# Patient Record
Sex: Male | Born: 1940
Health system: Southern US, Community
[De-identification: ages and names within clinical notes are randomized; demographics above are authoritative.]

## PROBLEM LIST (undated history)

## (undated) DIAGNOSIS — I499 Cardiac arrhythmia, unspecified: Secondary | ICD-10-CM

## (undated) DIAGNOSIS — R7303 Prediabetes: Secondary | ICD-10-CM

## (undated) DIAGNOSIS — S46109A Unspecified injury of muscle, fascia and tendon of long head of biceps, unspecified arm, initial encounter: Secondary | ICD-10-CM

## (undated) DIAGNOSIS — E785 Hyperlipidemia, unspecified: Secondary | ICD-10-CM

## (undated) DIAGNOSIS — H905 Unspecified sensorineural hearing loss: Secondary | ICD-10-CM

## (undated) DIAGNOSIS — M1990 Unspecified osteoarthritis, unspecified site: Secondary | ICD-10-CM

## (undated) DIAGNOSIS — K279 Peptic ulcer, site unspecified, unspecified as acute or chronic, without hemorrhage or perforation: Secondary | ICD-10-CM

## (undated) DIAGNOSIS — I10 Essential (primary) hypertension: Secondary | ICD-10-CM

## (undated) DIAGNOSIS — Z8669 Personal history of other diseases of the nervous system and sense organs: Secondary | ICD-10-CM

## (undated) DIAGNOSIS — I4892 Unspecified atrial flutter: Secondary | ICD-10-CM

## (undated) DIAGNOSIS — M7550 Bursitis of unspecified shoulder: Secondary | ICD-10-CM

## (undated) DIAGNOSIS — K219 Gastro-esophageal reflux disease without esophagitis: Secondary | ICD-10-CM

## (undated) DIAGNOSIS — C801 Malignant (primary) neoplasm, unspecified: Secondary | ICD-10-CM

## (undated) DIAGNOSIS — R9431 Abnormal electrocardiogram [ECG] [EKG]: Secondary | ICD-10-CM

## (undated) DIAGNOSIS — C4490 Unspecified malignant neoplasm of skin, unspecified: Secondary | ICD-10-CM

## (undated) HISTORY — DX: Personal history of other diseases of the nervous system and sense organs: Z86.69

## (undated) HISTORY — DX: Bursitis of unspecified shoulder: M75.50

## (undated) HISTORY — DX: Unspecified atrial flutter: I48.92

## (undated) HISTORY — DX: Prediabetes: R73.03

## (undated) HISTORY — DX: Unspecified sensorineural hearing loss: H90.5

## (undated) HISTORY — DX: Malignant (primary) neoplasm, unspecified: C80.1

## (undated) HISTORY — DX: Unspecified osteoarthritis, unspecified site: M19.90

## (undated) HISTORY — DX: Unspecified injury of muscle, fascia and tendon of long head of biceps, unspecified arm, initial encounter: S46.109A

## (undated) HISTORY — PX: MELANOMA EXCISION: SHX5266

## (undated) HISTORY — DX: Hyperlipidemia, unspecified: E78.5

## (undated) HISTORY — DX: Gastro-esophageal reflux disease without esophagitis: K21.9

## (undated) HISTORY — DX: Abnormal electrocardiogram (ECG) (EKG): R94.31

## (undated) HISTORY — DX: Peptic ulcer, site unspecified, unspecified as acute or chronic, without hemorrhage or perforation: K27.9

## (undated) HISTORY — PX: INTRAOCULAR LENS INSERTION: SHX110

## (undated) HISTORY — PX: WISDOM TOOTH EXTRACTION: SHX21

## (undated) HISTORY — PX: VASECTOMY: SHX75

---

## 1962-10-24 DIAGNOSIS — K279 Peptic ulcer, site unspecified, unspecified as acute or chronic, without hemorrhage or perforation: Secondary | ICD-10-CM

## 1962-10-24 HISTORY — DX: Peptic ulcer, site unspecified, unspecified as acute or chronic, without hemorrhage or perforation: K27.9

## 1997-10-24 DIAGNOSIS — R9431 Abnormal electrocardiogram [ECG] [EKG]: Secondary | ICD-10-CM

## 1997-10-24 HISTORY — DX: Abnormal electrocardiogram (ECG) (EKG): R94.31

## 1998-10-24 HISTORY — PX: REPLACEMENT TOTAL KNEE: SUR1224

## 1998-11-04 ENCOUNTER — Encounter: Payer: Self-pay | Admitting: Gastroenterology

## 1998-11-04 ENCOUNTER — Other Ambulatory Visit: Admission: RE | Admit: 1998-11-04 | Discharge: 1998-11-04 | Payer: Self-pay | Admitting: Gastroenterology

## 1999-08-25 ENCOUNTER — Encounter: Payer: Self-pay | Admitting: Orthopedic Surgery

## 1999-09-03 ENCOUNTER — Encounter: Payer: Self-pay | Admitting: Orthopedic Surgery

## 1999-09-03 ENCOUNTER — Inpatient Hospital Stay (HOSPITAL_COMMUNITY): Admission: RE | Admit: 1999-09-03 | Discharge: 1999-09-07 | Payer: Self-pay | Admitting: Orthopedic Surgery

## 2001-10-24 DIAGNOSIS — Z8669 Personal history of other diseases of the nervous system and sense organs: Secondary | ICD-10-CM

## 2001-10-24 HISTORY — DX: Personal history of other diseases of the nervous system and sense organs: Z86.69

## 2002-06-03 ENCOUNTER — Inpatient Hospital Stay (HOSPITAL_COMMUNITY): Admission: EM | Admit: 2002-06-03 | Discharge: 2002-06-14 | Payer: Self-pay | Admitting: Emergency Medicine

## 2002-06-03 ENCOUNTER — Encounter: Payer: Self-pay | Admitting: Emergency Medicine

## 2002-06-03 ENCOUNTER — Encounter: Payer: Self-pay | Admitting: Pediatrics

## 2002-06-04 ENCOUNTER — Encounter: Payer: Self-pay | Admitting: Pulmonary Disease

## 2002-06-05 ENCOUNTER — Encounter: Payer: Self-pay | Admitting: Pulmonary Disease

## 2002-06-06 ENCOUNTER — Encounter: Payer: Self-pay | Admitting: Pulmonary Disease

## 2002-06-07 ENCOUNTER — Encounter: Payer: Self-pay | Admitting: Critical Care Medicine

## 2002-06-08 ENCOUNTER — Encounter: Payer: Self-pay | Admitting: Critical Care Medicine

## 2002-06-10 ENCOUNTER — Encounter: Payer: Self-pay | Admitting: Critical Care Medicine

## 2002-06-11 ENCOUNTER — Encounter: Payer: Self-pay | Admitting: Critical Care Medicine

## 2002-06-12 ENCOUNTER — Encounter: Payer: Self-pay | Admitting: Critical Care Medicine

## 2002-06-14 ENCOUNTER — Inpatient Hospital Stay (HOSPITAL_COMMUNITY)
Admission: RE | Admit: 2002-06-14 | Discharge: 2002-06-18 | Payer: Self-pay | Admitting: Physical Medicine & Rehabilitation

## 2003-01-27 ENCOUNTER — Encounter: Payer: Self-pay | Admitting: Gastroenterology

## 2003-03-03 ENCOUNTER — Encounter: Payer: Self-pay | Admitting: Gastroenterology

## 2004-02-17 ENCOUNTER — Encounter: Payer: Self-pay | Admitting: Internal Medicine

## 2005-02-02 ENCOUNTER — Ambulatory Visit: Payer: Self-pay | Admitting: Gastroenterology

## 2005-02-14 ENCOUNTER — Ambulatory Visit: Payer: Self-pay | Admitting: Internal Medicine

## 2006-02-20 ENCOUNTER — Ambulatory Visit: Payer: Self-pay | Admitting: Internal Medicine

## 2007-03-01 DIAGNOSIS — G610 Guillain-Barre syndrome: Secondary | ICD-10-CM

## 2007-03-01 DIAGNOSIS — Z9889 Other specified postprocedural states: Secondary | ICD-10-CM

## 2007-03-01 DIAGNOSIS — M1990 Unspecified osteoarthritis, unspecified site: Secondary | ICD-10-CM

## 2007-03-05 ENCOUNTER — Ambulatory Visit: Payer: Self-pay | Admitting: Internal Medicine

## 2007-03-05 LAB — CONVERTED CEMR LAB
ALT: 26 units/L (ref 0–40)
AST: 26 units/L (ref 0–37)
Albumin: 4.1 g/dL (ref 3.5–5.2)
Alkaline Phosphatase: 73 units/L (ref 39–117)
BUN: 14 mg/dL (ref 6–23)
Basophils Absolute: 0 10*3/uL (ref 0.0–0.1)
Basophils Relative: 0.3 % (ref 0.0–1.0)
Bilirubin, Direct: 0.1 mg/dL (ref 0.0–0.3)
CO2: 30 meq/L (ref 19–32)
Calcium: 9.2 mg/dL (ref 8.4–10.5)
Chloride: 110 meq/L (ref 96–112)
Cholesterol: 156 mg/dL (ref 0–200)
Creatinine, Ser: 0.9 mg/dL (ref 0.4–1.5)
Eosinophils Absolute: 0.1 10*3/uL (ref 0.0–0.6)
Eosinophils Relative: 2.1 % (ref 0.0–5.0)
GFR calc Af Amer: 110 mL/min
GFR calc non Af Amer: 91 mL/min
Glucose, Bld: 116 mg/dL — ABNORMAL HIGH (ref 70–99)
HCT: 43.8 % (ref 39.0–52.0)
HDL: 25.2 mg/dL — ABNORMAL LOW (ref >39.0)
Hemoglobin: 15 g/dL (ref 13.0–17.0)
Hgb A1c MFr Bld: 6 % (ref 4.6–6.0)
LDL Cholesterol: 116 mg/dL — ABNORMAL HIGH (ref 0–99)
Lymphocytes Relative: 27 % (ref 12.0–46.0)
MCHC: 34.3 g/dL (ref 30.0–36.0)
MCV: 87.1 fL (ref 78.0–100.0)
Monocytes Absolute: 0.6 10*3/uL (ref 0.2–0.7)
Monocytes Relative: 8.6 % (ref 3.0–11.0)
Neutro Abs: 4.1 10*3/uL (ref 1.4–7.7)
Neutrophils Relative %: 62 % (ref 43.0–77.0)
PSA: 1.27 ng/mL (ref 0.10–4.00)
Platelets: 243 10*3/uL (ref 150–400)
Potassium: 4.8 meq/L (ref 3.5–5.1)
RBC: 5.03 M/uL (ref 4.22–5.81)
RDW: 13 % (ref 11.5–14.6)
Sodium: 143 meq/L (ref 135–145)
TSH: 1.86 microintl units/mL (ref 0.35–5.50)
Total Bilirubin: 0.6 mg/dL (ref 0.3–1.2)
Total CHOL/HDL Ratio: 6.2
Total Protein: 7 g/dL (ref 6.0–8.3)
Triglycerides: 74 mg/dL (ref 0–149)
VLDL: 15 mg/dL (ref 0–40)
WBC: 6.6 10*3/uL (ref 4.5–10.5)

## 2007-07-25 ENCOUNTER — Telehealth (INDEPENDENT_AMBULATORY_CARE_PROVIDER_SITE_OTHER): Payer: Self-pay | Admitting: *Deleted

## 2007-07-27 ENCOUNTER — Ambulatory Visit: Payer: Self-pay | Admitting: Internal Medicine

## 2007-08-03 ENCOUNTER — Encounter (INDEPENDENT_AMBULATORY_CARE_PROVIDER_SITE_OTHER): Payer: Self-pay | Admitting: *Deleted

## 2007-08-03 LAB — CONVERTED CEMR LAB
ALT: 24 units/L (ref 0–53)
AST: 24 units/L (ref 0–37)
Cholesterol: 119 mg/dL (ref 0–200)
HDL: 26.8 mg/dL — ABNORMAL LOW (ref >39.0)
LDL Cholesterol: 79 mg/dL (ref 0–99)
Total CHOL/HDL Ratio: 4.4
Triglycerides: 67 mg/dL (ref 0–149)
VLDL: 13 mg/dL (ref 0–40)

## 2008-01-30 ENCOUNTER — Telehealth (INDEPENDENT_AMBULATORY_CARE_PROVIDER_SITE_OTHER): Payer: Self-pay | Admitting: *Deleted

## 2008-01-30 ENCOUNTER — Ambulatory Visit: Payer: Self-pay | Admitting: Internal Medicine

## 2008-02-13 ENCOUNTER — Encounter (INDEPENDENT_AMBULATORY_CARE_PROVIDER_SITE_OTHER): Payer: Self-pay | Admitting: *Deleted

## 2008-02-13 LAB — CONVERTED CEMR LAB
ALT: 27 units/L (ref 0–53)
AST: 25 units/L (ref 0–37)
Cholesterol: 126 mg/dL (ref 0–200)
HDL: 24.6 mg/dL — ABNORMAL LOW (ref >39.0)
LDL Cholesterol: 85 mg/dL (ref 0–99)
Total CHOL/HDL Ratio: 5.1
Triglycerides: 81 mg/dL (ref 0–149)
VLDL: 16 mg/dL (ref 0–40)

## 2008-07-02 ENCOUNTER — Ambulatory Visit: Payer: Self-pay | Admitting: Internal Medicine

## 2008-07-02 DIAGNOSIS — R9431 Abnormal electrocardiogram [ECG] [EKG]: Secondary | ICD-10-CM

## 2008-07-02 DIAGNOSIS — K219 Gastro-esophageal reflux disease without esophagitis: Secondary | ICD-10-CM | POA: Insufficient documentation

## 2008-07-02 DIAGNOSIS — R351 Nocturia: Secondary | ICD-10-CM | POA: Insufficient documentation

## 2008-07-02 DIAGNOSIS — E785 Hyperlipidemia, unspecified: Secondary | ICD-10-CM

## 2008-07-02 DIAGNOSIS — R7303 Prediabetes: Secondary | ICD-10-CM

## 2008-07-02 DIAGNOSIS — E119 Type 2 diabetes mellitus without complications: Secondary | ICD-10-CM | POA: Insufficient documentation

## 2008-07-02 DIAGNOSIS — E1169 Type 2 diabetes mellitus with other specified complication: Secondary | ICD-10-CM | POA: Insufficient documentation

## 2008-07-02 HISTORY — DX: Prediabetes: R73.03

## 2008-07-07 ENCOUNTER — Telehealth (INDEPENDENT_AMBULATORY_CARE_PROVIDER_SITE_OTHER): Payer: Self-pay | Admitting: *Deleted

## 2008-07-08 ENCOUNTER — Encounter (INDEPENDENT_AMBULATORY_CARE_PROVIDER_SITE_OTHER): Payer: Self-pay | Admitting: *Deleted

## 2008-07-08 ENCOUNTER — Ambulatory Visit: Payer: Self-pay | Admitting: Internal Medicine

## 2009-01-07 ENCOUNTER — Encounter (INDEPENDENT_AMBULATORY_CARE_PROVIDER_SITE_OTHER): Payer: Self-pay | Admitting: *Deleted

## 2009-07-08 ENCOUNTER — Ambulatory Visit: Payer: Self-pay | Admitting: Internal Medicine

## 2009-07-08 DIAGNOSIS — Z85828 Personal history of other malignant neoplasm of skin: Secondary | ICD-10-CM

## 2009-07-08 DIAGNOSIS — N400 Enlarged prostate without lower urinary tract symptoms: Secondary | ICD-10-CM

## 2009-07-14 ENCOUNTER — Encounter (INDEPENDENT_AMBULATORY_CARE_PROVIDER_SITE_OTHER): Payer: Self-pay | Admitting: *Deleted

## 2009-10-09 ENCOUNTER — Ambulatory Visit: Payer: Self-pay | Admitting: Internal Medicine

## 2009-10-09 DIAGNOSIS — R1031 Right lower quadrant pain: Secondary | ICD-10-CM

## 2009-10-09 LAB — CONVERTED CEMR LAB
Bilirubin Urine: NEGATIVE
Glucose, Urine, Semiquant: NEGATIVE
Ketones, urine, test strip: NEGATIVE
Urobilinogen, UA: 0.2
pH: 6

## 2009-10-12 LAB — CONVERTED CEMR LAB
Basophils Absolute: 0 10*3/uL (ref 0.0–0.1)
Eosinophils Relative: 2 % (ref 0–5)
HCT: 42 % (ref 39.0–52.0)
Hemoglobin: 13.9 g/dL (ref 13.0–17.0)
Lymphocytes Relative: 28 % (ref 12–46)
Lymphs Abs: 2.2 10*3/uL (ref 0.7–4.0)
Monocytes Absolute: 0.7 10*3/uL (ref 0.1–1.0)
Monocytes Relative: 9 % (ref 3–12)
RBC: 4.7 M/uL (ref 4.22–5.81)
RDW: 13.3 % (ref 11.5–15.5)

## 2009-10-24 HISTORY — PX: OTHER SURGICAL HISTORY: SHX169

## 2010-01-25 ENCOUNTER — Encounter: Payer: Self-pay | Admitting: Internal Medicine

## 2010-01-26 ENCOUNTER — Encounter (INDEPENDENT_AMBULATORY_CARE_PROVIDER_SITE_OTHER): Payer: Self-pay | Admitting: *Deleted

## 2010-02-15 ENCOUNTER — Telehealth: Payer: Self-pay | Admitting: Gastroenterology

## 2010-02-19 ENCOUNTER — Encounter (INDEPENDENT_AMBULATORY_CARE_PROVIDER_SITE_OTHER): Payer: Self-pay | Admitting: *Deleted

## 2010-03-15 ENCOUNTER — Ambulatory Visit: Payer: Self-pay | Admitting: Gastroenterology

## 2010-03-15 DIAGNOSIS — R195 Other fecal abnormalities: Secondary | ICD-10-CM

## 2010-03-16 LAB — CONVERTED CEMR LAB
ALT: 25 units/L (ref 0–53)
AST: 27 units/L (ref 0–37)
Albumin: 4.1 g/dL (ref 3.5–5.2)
Alkaline Phosphatase: 71 units/L (ref 39–117)
BUN: 15 mg/dL (ref 6–23)
Basophils Absolute: 0 10*3/uL (ref 0.0–0.1)
CO2: 26 meq/L (ref 19–32)
Chloride: 104 meq/L (ref 96–112)
Eosinophils Relative: 1.5 % (ref 0.0–5.0)
Glucose, Bld: 122 mg/dL — ABNORMAL HIGH (ref 70–99)
Lipase: 28 units/L (ref 11.0–59.0)
Lymphs Abs: 1.6 10*3/uL (ref 0.7–4.0)
Monocytes Relative: 8.4 % (ref 3.0–12.0)
Neutrophils Relative %: 63.3 % (ref 43.0–77.0)
Platelets: 216 10*3/uL (ref 150.0–400.0)
Potassium: 4.3 meq/L (ref 3.5–5.1)
RDW: 14.2 % (ref 11.5–14.6)
Sodium: 136 meq/L (ref 135–145)
Total Protein: 6.8 g/dL (ref 6.0–8.3)
WBC: 6 10*3/uL (ref 4.5–10.5)

## 2010-03-18 ENCOUNTER — Telehealth: Payer: Self-pay | Admitting: Gastroenterology

## 2010-03-18 ENCOUNTER — Telehealth (INDEPENDENT_AMBULATORY_CARE_PROVIDER_SITE_OTHER): Payer: Self-pay | Admitting: *Deleted

## 2010-03-19 ENCOUNTER — Telehealth: Payer: Self-pay | Admitting: Gastroenterology

## 2010-03-26 ENCOUNTER — Ambulatory Visit: Payer: Self-pay | Admitting: Gastroenterology

## 2010-03-31 ENCOUNTER — Encounter: Payer: Self-pay | Admitting: Gastroenterology

## 2010-06-18 ENCOUNTER — Telehealth: Payer: Self-pay | Admitting: Gastroenterology

## 2010-06-18 ENCOUNTER — Ambulatory Visit: Payer: Self-pay | Admitting: Gastroenterology

## 2010-06-18 LAB — CONVERTED CEMR LAB: BUN: 20 mg/dL (ref 6–23)

## 2010-06-30 ENCOUNTER — Ambulatory Visit: Payer: Self-pay | Admitting: Cardiovascular Disease

## 2010-10-24 HISTORY — PX: ABLATION OF DYSRHYTHMIC FOCUS: SHX254

## 2010-11-14 ENCOUNTER — Encounter: Payer: Self-pay | Admitting: Gastroenterology

## 2010-11-21 LAB — CONVERTED CEMR LAB
ALT: 37 units/L (ref 0–53)
AST: 24 units/L (ref 0–37)
AST: 26 units/L (ref 0–37)
Albumin: 4.1 g/dL (ref 3.5–5.2)
Alkaline Phosphatase: 80 units/L (ref 39–117)
BUN: 13 mg/dL (ref 6–23)
BUN: 15 mg/dL (ref 6–23)
Bilirubin, Direct: 0 mg/dL (ref 0.0–0.3)
Calcium: 9.1 mg/dL (ref 8.4–10.5)
Cholesterol, target level: 200 mg/dL
Cholesterol: 137 mg/dL (ref 0–200)
Creatinine, Ser: 0.7 mg/dL (ref 0.4–1.5)
Eosinophils Relative: 1.3 % (ref 0.0–5.0)
Eosinophils Relative: 2.1 % (ref 0.0–5.0)
GFR calc Af Amer: 125 mL/min
GFR calc non Af Amer: 119.86 mL/min (ref >60)
Glucose, Bld: 116 mg/dL — ABNORMAL HIGH (ref 70–99)
HCT: 45.5 % (ref 39.0–52.0)
HDL goal, serum: 40 mg/dL
HDL: 24.1 mg/dL — ABNORMAL LOW (ref >39.0)
Hemoglobin: 15.7 g/dL (ref 13.0–17.0)
Hgb A1c MFr Bld: 6 % (ref 4.6–6.5)
LDL Cholesterol: 90 mg/dL (ref 0–99)
LDL Goal: 100 mg/dL
Monocytes Absolute: 0.5 10*3/uL (ref 0.1–1.0)
Monocytes Relative: 7.4 % (ref 3.0–12.0)
Monocytes Relative: 8.2 % (ref 3.0–12.0)
Neutrophils Relative %: 64.6 % (ref 43.0–77.0)
Platelets: 218 10*3/uL (ref 150.0–400.0)
Platelets: 234 10*3/uL (ref 150–400)
Potassium: 5.3 meq/L — ABNORMAL HIGH (ref 3.5–5.1)
RBC: 5.03 M/uL (ref 4.22–5.81)
TSH: 1.38 microintl units/mL (ref 0.35–5.50)
Total Bilirubin: 1 mg/dL (ref 0.3–1.2)
Total CHOL/HDL Ratio: 6.5
Total Protein: 7.1 g/dL (ref 6.0–8.3)
Triglycerides: 90 mg/dL (ref 0.0–149.0)
VLDL: 18 mg/dL (ref 0.0–40.0)
WBC: 6.1 10*3/uL (ref 4.5–10.5)
WBC: 6.4 10*3/uL (ref 4.5–10.5)

## 2010-11-25 NOTE — Miscellaneous (Signed)
Summary: Orders Update   Clinical Lists Changes  Orders: Added new Referral order of Gastroenterology Referral (GI) - Signed 

## 2010-11-25 NOTE — Procedures (Signed)
Summary: Colonoscopy    Colonoscopy  Procedure date:  03/03/2003  Findings:      Results: Hemorrhoids.     Location:  [facility].    Procedures Next Due Date:    Colonoscopy: 02/2013 Patient Name: Troy Garrison, Troy Garrison MRN:  Procedure Procedures: Colonoscopy CPT: [address].    with biopsy. CPT: Q5068410.  Personnel: Endoscopist: Venita Lick. Russella Dar, MD, Clementeen Graham.  Exam Location: Exam performed in Outpatient Clinic. Outpatient  Patient Consent: Procedure, Alternatives, Risks and Benefits discussed, consent obtained, from patient. Consent was obtained by the RN.  Indications  Evaluation of: Positive fecal occult blood test  History  Pre-Exam Physical: Performed Mar 03, 2003. Entire physical exam was normal.  Exam Exam: Extent of exam reached: Cecum, extent intended: Cecum.  The cecum was identified by appendiceal orifice and IC valve. Colon retroflexion performed. ASA Classification: II. Tolerance: good.  Monitoring: Pulse and BP monitoring, Oximetry used. Supplemental O2 given.  Colon Prep Used Golytely for colon prep. Prep results: good.  Sedation Meds: Patient assessed and found to be appropriate for moderate (conscious) sedation. Fentanyl 100 mcg. given IV. Versed 10 mg. given IV.  Findings NORMAL EXAM: Cecum to Sigmoid Colon.  HEMORRHOIDS: Internal. Size: Small. Not bleeding. Not thrombosed. ICD9: Hemorrhoids, Internal: 455.0.   Assessment  Diagnoses: 455.0: Hemorrhoids, Internal.   Events  Unplanned Interventions: No intervention was required.  Unplanned Events: There were no complications. Plans Medication Plan: Continue current medications.  Patient Education: Patient given standard instructions for: Hemorrhoids.  Disposition: After procedure patient sent to recovery. After recovery patient sent home.  Scheduling/Referral: Colonoscopy, to [facility] T. Russella Dar, MD, [facility], around Mar 02, 2010.  Primary Care Provider, to Titus Dubin. Alwyn Ren, MD,     This report was created from the original endoscopy report, which was reviewed and signed by the above listed endoscopist.    cc: Titus Dubin. Alwyn Ren, MD

## 2010-11-25 NOTE — Progress Notes (Signed)
Summary: Insurance denial of CT scan  Phone Note Outgoing Call Call back at Work Phone [phone]   Call placed by: Christie Nottingham CMA Duncan Dull),  Mar 18, 2010 8:38 AM Call placed to: Patient Summary of Call: We recieved a faxed denial letter for CT scan of the abdomen and pelvis from his insurance company. Called pt to inform him that we are cancelling his test so he does not pay out of pocket for the test. Told pt I would send this note to Dr. Russella Dar informing him of the insurance's decision.  Initial call taken by: Christie Nottingham CMA Duncan Dull),  Mar 18, 2010 8:41 AM  Follow-up for Phone Call        Noted. Follow-up by: Meryl Dare MD Clementeen Graham,  Mar 18, 2010 10:57 AM

## 2010-11-25 NOTE — Letter (Signed)
Summary: [facility] Instructions  [facility]  [address] [place], [place] [address]   Phone: [phone]  Fax: [phone]       Troy Garrison    1941/06/05    MRN: [id number]        Procedure Day /Date: Friday June 3rd, 2011     Arrival Time: 1:00pm      Procedure Time: 2:00pm     Location of Procedure:                    _x _  [facility] (4th Floor)                        PREPARATION FOR COLONOSCOPY WITH MOVIPREP   Starting 5 days prior to your procedure 03/22/10 do not eat nuts, seeds, popcorn, corn, beans, peas,  salads, or any raw vegetables.  Do not take any fiber supplements (e.g. Metamucil, Citrucel, and Benefiber).  THE DAY BEFORE YOUR PROCEDURE         DATE: 03/25/10 DAY: Thursday  1.  Drink clear liquids the entire day-NO SOLID FOOD  2.  Do not drink anything colored red or purple.  Avoid juices with pulp.  No orange juice.  3.  Drink at least 64 oz. (8 glasses) of fluid/clear liquids during the day to prevent dehydration and help the prep work efficiently.  CLEAR LIQUIDS INCLUDE: Water Jello Ice Popsicles Tea (sugar ok, no milk/cream) Powdered fruit flavored drinks Coffee (sugar ok, no milk/cream) Gatorade Juice: apple, white grape, white cranberry  Lemonade Clear bullion, consomm, broth Carbonated beverages (any kind) Strained chicken noodle soup Hard Candy                             4.  In the morning, mix first dose of MoviPrep solution:    Empty 1 Pouch A and 1 Pouch B into the disposable container    Add lukewarm drinking water to the top line of the container. Mix to dissolve    Refrigerate (mixed solution should be used within 24 hrs)  5.  Begin drinking the prep at 5:00 p.m. The MoviPrep container is divided by 4 marks.   Every 15 minutes drink the solution down to the next mark (approximately 8 oz) until the full liter is complete.   6.  Follow completed prep with 16 oz of clear liquid of your choice  (Nothing red or purple).  Continue to drink clear liquids until bedtime.  7.  Before going to bed, mix second dose of MoviPrep solution:    Empty 1 Pouch A and 1 Pouch B into the disposable container    Add lukewarm drinking water to the top line of the container. Mix to dissolve    Refrigerate  THE DAY OF YOUR PROCEDURE      DATE: 03/26/10 DAY: Friday  Beginning at 9:00 a.m. (5 hours before procedure):         1. Every 15 minutes, drink the solution down to the next mark (approx 8 oz) until the full liter is complete.  2. Follow completed prep with 16 oz. of clear liquid of your choice.    3. You may drink clear liquids until 12:00noon (2 HOURS BEFORE PROCEDURE).   MEDICATION INSTRUCTIONS  Unless otherwise instructed, you should take regular prescription medications with a small sip of water   as early as possible the morning of your  procedure.         OTHER INSTRUCTIONS  You will need a responsible adult at least 68 years of age to accompany you and drive you home.   This person must remain in the waiting room during your procedure.  Wear loose fitting clothing that is easily removed.  Leave jewelry and other valuables at home.  However, you may wish to bring a book to read or  an iPod/MP3 player to listen to music as you wait for your procedure to start.  Remove all body piercing jewelry and leave at home.  Total time from sign-in until discharge is approximately 2-3 hours.  You should go home directly after your procedure and rest.  You can resume normal activities the  day after your procedure.  The day of your procedure you should not:   Drive   Make legal decisions   Operate machinery   Drink alcohol   Return to work  You will receive specific instructions about eating, activities and medications before you leave.    The above instructions have been reviewed and explained to me by   Estill Bamberg.     I fully understand and can verbalize these  instructions _____________________________ Date _________

## 2010-11-25 NOTE — Letter (Signed)
Summary: Patient Notice- Polyp Results  [facility]  [address] [place], [place] [address]   Phone: [phone]  Fax: [phone]        March 31, 2010 MRN: [id number]    Troy Garrison [address] [place], [place]  [address]    Dear Troy Garrison,  I am pleased to inform you that the colon polyp(s) removed during your recent colonoscopy was (were) found to be benign (no cancer detected) upon pathologic examination.  I recommend you have a repeat colonoscopy examination in 1 year to look for recurrent polyps, as having colon polyps increases your risk for having recurrent polyps or even colon cancer in the future.  Should you develop new or worsening symptoms of abdominal pain, bowel habit changes or bleeding from the rectum or bowels, please schedule an evaluation with either your primary care physician or with me.  Continue treatment plan as outlined the day of your exam.  Please call us if you are having persistent problems or have questions about your condition that have not been fully answered at this time.  Sincerely,  Meryl Dare MD [facility]  This letter has been electronically signed by your physician.  Appended Document: Patient Notice- Polyp Results letter mailed.

## 2010-11-25 NOTE — Consult Note (Signed)
Summary: GI Consult/[facility]  GI Consult/[facility]   Imported By: Sherian Rein 03/16/2010 07:11:37  _____________________________________________________________________  External Attachment:    Type:   Image     Comment:   External Document

## 2010-11-25 NOTE — Progress Notes (Signed)
Summary: CT scan denial  ---- Converted from flag ---- ---- 03/18/2010 10:07 AM, Christie Nottingham CMA (AAMA) wrote: Thankyou for checking his authorization number. I called him and told him and he also said thankyou and he will call his insurance later to get it straightened out but I cancelled his CT scan.   ---- 03/18/2010 9:57 AM, Dwan Bolt wrote: per Ms Gaye Pollack at [facility], the [place] number the patient provided does not correlate to any order in their system; CT was denied ------------------------------  Phone Note Outgoing Call Call back at Work Phone [phone]   Call placed by: Christie Nottingham CMA Duncan Dull),  Mar 18, 2010 10:08 AM Call placed to: Patient Summary of Call: Called pt to tell him that the insurance auth # that his insurance company gave him does not correlate with any other number [facility] has and I cancelled his CT scan for tomorrow. Pt states he will try to call his insurance later and figure out why they gave him wrong information.  Initial call taken by: Christie Nottingham CMA Duncan Dull),  Mar 18, 2010 10:09 AM

## 2010-11-25 NOTE — Assessment & Plan Note (Signed)
Summary: RLQ PAIN/YF   History of Present Illness Visit Type: Initial Consult Primary GI MD: Elie Goody MD [facility] Primary Provider: Marga Melnick, MD Chief Complaint: Patient c/o 6 months RLQ dull abdominal pain. Pain can become sharp at times. Patient states that pain is worse with standing still. He denies any GI symptoms.  History of Present Illness:   This is a 70 year old male, who relates a 6 month history of recurrent right lower quadrant pain. His symptoms occur when he has been standing still and they occurred once when he was walking. The pain has had no relation to any digestive function. Intermittently he notes a dull ache and occasionally the pain is described as sharp and more severe. The symptoms were particularly bothersome when he spent a long time on his feet on Easter Sunday. He underwent colonoscopy in 02/2003, showing only internal hemorrhoids.   GI Review of Systems    Reports abdominal pain.     Location of  Abdominal pain: RLQ.    Denies acid reflux, belching, bloating, chest pain, dysphagia with liquids, dysphagia with solids, heartburn, loss of appetite, nausea, vomiting, vomiting blood, weight loss, and  weight gain.        Denies anal fissure, black tarry stools, change in bowel habit, constipation, diarrhea, diverticulosis, fecal incontinence, heme positive stool, hemorrhoids, irritable bowel syndrome, jaundice, light color stool, liver problems, rectal bleeding, and  rectal pain.   Current Medications (verified): 1)  Nexium 40 Mg Pack (Esomeprazole Magnesium) .... 1 By Mouth Once Daily As Needed 2)  Pravastatin Sodium 40 Mg Tabs (Pravastatin Sodium) .... Daily As Directed (1/2 At Bedtime) 3)  Fish Oil 500 Mg Caps (Omega-3 Fatty Acids) .... Take 1 Capsule By Mouth Once Daily 4)  Aspirin 325 Mg Tabs (Aspirin) .... Take 1 Tablet By Mouth Once A Day 5)  Tylenol Extra Strength 500 Mg Tabs (Acetaminophen) .... Take As Needed For Joint Pain 6)  Alli (Unknown  Dosage) .... Take As Directed  Allergies (verified): No Known Drug Allergies  Past History:  Past Medical History: Osteoarthritis Marked LAD on EKG since 1999, stable GERD Hyperlipidemia: LDL goal = < 100 based on NMR Lipoprofile Hyperglycemia Skin cancer, hx of, Basal & Squamous Cell  RUE & L face Hemorrhoids Guillain Barre, 2003 Peptic ulcer with bleed, 1964   Past Surgical History: Vasectomy  Colonoscopy 2004 : internal  hemorrhoids, Dr Stark (due 2014);  Upper Endo 2006 neg Total knee replacement L  Family History: Father: MI @ 50, CHF d 69 Mother: Lupus, d @ 92 Siblings: bro MI @ 38, CBAG @ 42, DM ; PGF prostate CA No FH of Colon Cancer: Family History of Prostate Cancer: Grandfather  Social History: Reviewed history from 07/08/2009 and no changes required. 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 All other ROS were reviewed and were negative.   Vital Signs:  Patient profile:   70 year old male Height:      72 inches Weight:      263.13 pounds BMI:     35 .82 BSA:     2.40 Pulse rate:   76 / minute Pulse rhythm:   regular BP sitting:   162 / 90  (left arm)  Vitals Entered By: Lamona Curl CMA Duncan Dull) (Mar 15, 2010 9:55 AM)  Physical  Exam  General:  Well developed, well nourished, no acute distress. obese.   Head:  Normocephalic and atraumatic. Eyes:  PERRLA, no icterus. Ears:  Normal auditory acuity. Mouth:  No deformity or lesions, dentition normal. Neck:  Supple; no masses or thyromegaly. Lungs:  Clear throughout to auscultation. Heart:  Regular rate and rhythm; no murmurs, rubs,  or bruits. Abdomen:  Soft, nontender and nondistended. No masses, hepatosplenomegaly or hernias noted. Normal bowel sounds. Rectal:  hemoccult positive scant stool and mucus.  no  lesions. Msk:  Symmetrical with no gross deformities. Normal posture. Pulses:  Normal pulses noted. Extremities:  No clubbing, cyanosis, edema or deformities noted. Neurologic:  Alert and  oriented x4;  grossly normal neurologically. Skin:  Intact without significant lesions or rashes. Cervical Nodes:  No significant cervical adenopathy. Inguinal Nodes:  No significant inguinal adenopathy. Psych:  Alert and cooperative. Normal mood and affect.  Impression & Recommendations:  Problem # 1:  RLQ PAIN (ICD-789.03)  Recurrent right lower quadrant pain, which has musculoskeletal features. Evaluate for intra-abdominal processes. Schedule blood work and CT scan of the abdomen and pelvis. If the gastrointestinal evaluation is unremarkable, return to Dr. Alwyn Ren to evaluate for musculoskeletal problems.  Orders: TLB-BMP (Basic Metabolic Panel-BMET) (80048-METABOL) TLB-Hepatic/Liver Function Pnl (80076-HEPATIC) TLB-CBC Platelet - w/Differential (85025-CBCD) TLB-Lipase (83690-LIPASE) Colonoscopy (Colon) CT Abdomen/Pelvis with Contrast (CT Abd/Pelvis w/con)  Problem # 2:  FECAL OCCULT BLOOD (ICD-792.1)  Rule out colorectal neoplasms. The risks, benefits and alternatives to colonoscopy with possible biopsy and possible polypectomy were discussed with the patient and they consent to proceed. The procedure will be scheduled electively.  Orders: TLB-BMP (Basic Metabolic Panel-BMET) (80048-METABOL) TLB-Hepatic/Liver Function Pnl (80076-HEPATIC) TLB-CBC Platelet - w/Differential (85025-CBCD) TLB-Lipase (83690-LIPASE) Colonoscopy (Colon) CT Abdomen/Pelvis with Contrast (CT Abd/Pelvis w/con)  Problem # 3:  GERD (ICD-530.81) Intermittent reflux, symptoms, well-controlled with as needed Nexium.  Patient Instructions: 1)  Get your labs drawn today in the basement.  2)  You have been scheduled for a CT scan.  3)  Colonoscopy brochure given.  4)  Copy sent to : Marga Melnick, MD 5)  The medication  list was reviewed and reconciled.  All changed / newly prescribed medications were explained.  A complete medication list was provided to the patient / caregiver.  Prescriptions: MOVIPREP 100 GM  SOLR (PEG-KCL-NACL-NASULF-NA ASC-C) As per prep instructions.  #1 x 0   Entered by:   Christie Nottingham CMA (AAMA)   Authorized by:   Meryl Dare MD [facility]   Signed by:   Meryl Dare MD FACG on 03/15/2010   Method used:   Electronically to        [facility]  [id number] ([facility]       [address]       [place], [place]  [address]       Ph: [phone] or [phone]       Fax: [phone]   RxID:   [id number]

## 2010-11-25 NOTE — Progress Notes (Signed)
Summary: CT scan  Phone Note Call from Patient Call back at Work Phone [phone]   Caller: Patient Call For: Dr. Russella Dar Reason for Call: Talk to Nurse Summary of Call: would like to discussed CT scan and ins declining it Initial call taken by: Vallarie Mare,  Mar 19, 2010 9:29 AM  Follow-up for Phone Call        Left message for patient to call back Darcey Nora RN, [facility]  Mar 19, 2010 3:34 PM  Left message for patient to call back Darcey Nora RN, [facility]  Mar 23, 2010 9:20 AM  Left message for patient to call back Darcey Nora RN, [facility]  March 24, 2010 10:08 AM  all questions answered. Darcey Nora RN, [facility]  March 24, 2010 10:36 AM Follow-up by: Darcey Nora RN, CGRN,  March 24, 2010 10:36 AM

## 2010-11-25 NOTE — Progress Notes (Signed)
Summary: speak to nurse  Phone Note Call from Patient Call back at Work Phone [phone]   Caller: Patient Call For: Dr Russella Dar Reason for Call: Talk to Nurse Summary of Call: Patient wants to reschedule his ct states that he spoke to insurance company and they autorized it. Initial call taken by: Tawni Levy,  June 18, 2010 10:57 AM  Follow-up for Phone Call        Patient  says he did an  appeal with his insurance company and they  have authorized the CT scan now. he was mailed a written authorization. Patient  reports he is still having the same  RLQ pain with no improvement since May.  Please advise if ok to order the CT scan now.  Autorization [id number] abdomen and pelvis valid 06/10/10-07/25/10.  Case # [id number] qn.   Follow-up by: Darcey Nora RN, CGRN,  June 18, 2010 11:16 AM  Additional Follow-up for Phone Call Additional follow up Details #1::        Please proceed with abd/pelvic CT Additional Follow-up by: Meryl Dare MD Clementeen Graham,  June 18, 2010 11:27 AM    Additional Follow-up for Phone Call Additional follow up Details #2::    patient scheduled for CT abdomen and pelvis with contrast for 06/30/10 9:30, he will come for lab work today.  Instructions will be put at the front desk. Follow-up by: Darcey Nora RN, CGRN,  June 18, 2010 11:44 AM

## 2010-11-25 NOTE — Letter (Signed)
Summary: Colonoscopy Date Change Letter  [facility]  [address] [place], [place] [address]   Phone: [phone]  Fax: [phone]      February 19, 2010 MRN: [id number]   Troy Garrison [address] [place], [place]  [address]   Dear Troy Garrison,   Previously you were recommended to have a repeat colonoscopy around this time. Your chart was recently reviewed by Dr. Judie Petit T. Russella Dar of [facility]. Follow up colonoscopy is now recommended in  May 2014. This revised recommendation is based on current, nationally recognized guidelines for colorectal cancer screening and polyp surveillance. These guidelines are endorsed by the American Cancer Society, [employer] on Colorectal Cancer as well as numerous other major medical organizations.  Please understand that our recommendation assumes that you do not have any new symptoms such as bleeding, a change in bowel habits, anemia, or significant abdominal discomfort. If you do have any concerning GI symptoms or want to discuss the guideline recommendations, please call to arrange an office visit at your earliest convenience. Otherwise we will keep you in our reminder system and contact you 1-2 months prior to the date listed above to schedule your next colonoscopy.  Thank you,  Judie Petit T. Russella Dar, M.D.  [facility] Gastroenterology Division [phone]

## 2010-11-25 NOTE — Procedures (Signed)
Summary: Flexible Sigmoidoscopy/[facility]  Flexible Sigmoidoscopy/[facility]   Imported By: Sherian Rein 03/16/2010 07:09:06  _____________________________________________________________________  External Attachment:    Type:   Image     Comment:   External Document

## 2010-11-25 NOTE — Letter (Signed)
Summary: New Patient letter  [facility]  [address] [place], [place] [address]   Phone: [phone]  Fax: [phone]       01/26/2010 MRN: [id number]  Troy Garrison [address] [place], [place]  [address]  Dear Troy Garrison,  Welcome to the Gastroenterology Division at [facility].    You are scheduled to see Dr.  Russella Dar on Mar 15, 2010 at 10am on the 3rd floor at [facility], [address]  We ask that you try to arrive at our office 15 minutes prior to your appointment time to allow for check-in.  We would like you to complete the enclosed self-administered evaluation form prior to your visit and bring it with you on the day of your appointment.  We will review it with you.  Also, please bring a complete list of all your medications or, if you prefer, bring the medication bottles and we will list them.  Please bring your insurance card so that we may make a copy of it.  If your insurance requires a referral to see a specialist, please bring your referral form from your primary care physician.  Co-payments are due at the time of your visit and may be paid by cash, check or credit card.     Your office visit will consist of a consult with your physician (includes a physical exam), any laboratory testing he/she may order, scheduling of any necessary diagnostic testing (e.g. x-ray, ultrasound, CT-scan), and scheduling of a procedure (e.g. Endoscopy, Colonoscopy) if required.  Please allow enough time on your schedule to allow for any/all of these possibilities.    If you cannot keep your appointment, please call [phone] to cancel or reschedule prior to your appointment date.  This allows [place] the opportunity to schedule an appointment for another patient in need of care.  If you do not cancel or reschedule by 5 p.m. the business day prior to your appointment date, you will be charged a $50.00 late cancellation/no-show fee.    Thank you for choosing  [facility] for your medical needs.  We appreciate the opportunity to care for you.  Please visit [place] at our website  to learn more about our practice.                     Sincerely,                                                             The Gastroenterology Division

## 2010-11-25 NOTE — Progress Notes (Signed)
Summary: [facility], [facility]   Imported By: Sherian Rein 03/16/2010 07:12:40  _____________________________________________________________________  External Attachment:    Type:   Image     Comment:   External Document

## 2010-11-25 NOTE — Procedures (Signed)
Summary: Colonoscopy  Patient: Leng Montesdeoca Note: All result statuses are Final unless otherwise noted.  Tests: (1) Colonoscopy (COL)   COL Colonoscopy           DONE     [facility]     [address]     [place], [place]  [address]           COLONOSCOPY PROCEDURE REPORT           PATIENT:  Troy Garrison, Troy Garrison  MR#:  [id number]     BIRTHDATE:  Dec 10, 1940, 68 yrs. old  GENDER:  male     ENDOSCOPIST:  Judie Petit T. Russella Dar, MD, [facility]           PROCEDURE DATE:  03/26/2010     PROCEDURE:  Colonoscopy with snare polypectomy     ASA CLASS:  Class II     INDICATIONS:  1) FOBT positive stool  2) abdominal pain -RLQ.     MEDICATIONS:   Fentanyl 125 mcg IV, Versed 12 mg IV, Benadryl 25     mg IV     DESCRIPTION OF PROCEDURE:   After the risks benefits and     alternatives of the procedure were thoroughly explained, informed     consent was obtained.  Digital rectal exam was performed and     revealed no abnormalities.   The LB PCF-H180AL [id number] endoscope     was introduced through the anus and advanced to the cecum, which     was identified by both the appendix and ileocecal valve, limited     by a tortuous colon.  The quality of the prep was excellent, using     MoviPrep.  The instrument was then slowly withdrawn as the colon     was fully examined.     <<PROCEDUREIMAGES>>     FINDINGS:  Two polyps were found in the ascending colon. They were     4 - 5 mm in size. Polyps were snared without cautery. Retrieval     was successful. A sessile polyp was found in the distal transverse     colon. It was 12 mm in size. Polyp was snared, then cauterized     with monopolar cautery. Retrieval was successful. Piecemeal     polypectomy.  Normal colon otherwise in the distal transverse     colon. A submucosal injection was performed with [place] ink tattoo     at 2 sites adjacent to the polyp site. This was otherwise a normal     examination of the colon.  Retroflexed views in the rectum  revealed no abnormalities.   The time to cecum =  6  minutes. The     scope was then withdrawn (time =  21.5  min) from the patient and     the procedure completed.           COMPLICATIONS:  None           ENDOSCOPIC IMPRESSION:     1) 4 - 5 mm, two polyps in the ascending colon     2) 12 mm sessile polyp in the distal transverse colon           RECOMMENDATIONS:     1) Await pathology results     2) No aspirin or NSAID's for 2 weeks     3) If the polyps removed today are adenomatous (pre-cancerous),     you will need a colonoscopy in 1-3 years pending patholology  review. Otherwise you should continue to follow colorectal cancer     screening guidelines for "routine risk" patients with a     colonoscopy in 10 years.           Venita Lick. Russella Dar, MD, [facility]           CC: Pecola Lawless, MD           n.     Rosalie DoctorVenita Lick. Jawaun Celmer at 03/26/2010 02:37 PM           Wallace Keller, [phone]  Note: An exclamation mark (!) indicates a result that was not dispersed into the flowsheet. Document Creation Date: 03/26/2010 2:37 PM _______________________________________________________________________  (1) Order result status: Final Collection or observation date-time: 03/26/2010 14:28 Requested date-time:  Receipt date-time:  Reported date-time:  Referring Physician:   Ordering Physician: Claudette Head [phone]) Specimen Source:  Source: Launa Grill Order Number: [id number] Lab site:   Appended Document: Colonoscopy     Procedures Next Due Date:    Colonoscopy: 03/2011

## 2010-11-25 NOTE — Progress Notes (Signed)
Summary: Triage  Phone Note Call from Patient Call back at Work Phone [phone]   Reason for Call: Acute Illness Details for Reason: Triage Summary of Call: Having abdom pain that has now turned into an ache. Started yesterday and lasted 3 hours iun severe pain. Wants to see of he can be worked in with a sooner appt. Plerase advise. Initial call taken by: Zackery Barefoot,  February 15, 2010 8:17 AM  Follow-up for Phone Call        Patient  states pain has been off and on for months.  He states he has a dull ache but no pain today.  I have advised him to call back if he developes the pain again and at that time we can try and work him in.  I have placed him on the cancelation list and he will keep his appointment with Dr Russella Dar for now.  Patient  agreeable to the plan Follow-up by: Darcey Nora RN, CGRN,  February 15, 2010 9:38 AM

## 2010-12-18 ENCOUNTER — Encounter: Payer: Self-pay | Admitting: Internal Medicine

## 2011-01-27 ENCOUNTER — Ambulatory Visit (INDEPENDENT_AMBULATORY_CARE_PROVIDER_SITE_OTHER): Payer: Medicare Other | Admitting: Internal Medicine

## 2011-01-27 ENCOUNTER — Encounter: Payer: Self-pay | Admitting: Internal Medicine

## 2011-01-27 VITALS — BP 124/72 | HR 80 | Temp 98.0°F | Resp 14 | Ht 72.0 in | Wt 253.4 lb

## 2011-01-27 DIAGNOSIS — E785 Hyperlipidemia, unspecified: Secondary | ICD-10-CM

## 2011-01-27 DIAGNOSIS — N429 Disorder of prostate, unspecified: Secondary | ICD-10-CM

## 2011-01-27 DIAGNOSIS — Z0000 Encounter for general adult medical examination without abnormal findings: Secondary | ICD-10-CM

## 2011-01-27 DIAGNOSIS — K219 Gastro-esophageal reflux disease without esophagitis: Secondary | ICD-10-CM

## 2011-01-27 DIAGNOSIS — I4892 Unspecified atrial flutter: Secondary | ICD-10-CM

## 2011-01-27 LAB — CBC WITH DIFFERENTIAL/PLATELET
Eosinophils Absolute: 0.1 10*3/uL (ref 0.0–0.7)
Eosinophils Relative: 2 % (ref 0–5)
HCT: 45 % (ref 39.0–52.0)
Hemoglobin: 14.9 g/dL (ref 13.0–17.0)
Lymphocytes Relative: 32 % (ref 12–46)
Lymphs Abs: 2.2 10*3/uL (ref 0.7–4.0)
MCH: 30 pg (ref 26.0–34.0)
MCV: 90.7 fL (ref 78.0–100.0)
Monocytes Absolute: 0.5 10*3/uL (ref 0.1–1.0)
Monocytes Relative: 7 % (ref 3–12)
Platelets: 208 10*3/uL (ref 150–400)
RBC: 4.96 MIL/uL (ref 4.22–5.81)
WBC: 6.9 10*3/uL (ref 4.0–10.5)

## 2011-01-27 NOTE — Patient Instructions (Signed)
Please take 325 mg of coated aspirin once daily with a meal. Do not take on empty stomach. While on this full dose of aspirin I would recommend continuing the Nexium each morning before breakfast. Please avoid stimulants as much as possible; this would include decongestants, diet pills, and caffeine.                         Preventive Health Care: Exercise at least 30-45 minutes a day,  3-4 days a week.  Eat a low-fat diet with lots of fruits and vegetables, up to 7-9 servings per day. Avoid obesity; your goal is waist measurement < 40 inches.Consume less than 40 grams of sugar per day from foods & drinks with High Fructose Corn Sugar as #2,3 or # 4 on label. Seatbelts can save your life. Wear them always. Smoke detectors on every level of your home, check batteries every year. Eye Doctor - have an eye exam @ least annually.

## 2011-01-27 NOTE — Progress Notes (Signed)
Subjective:    Patient ID: Troy Garrison, male    DOB: [birth year], 70 y.o.   MRN: [id number]  HPI  Medicare Wellness Visit:  The following psychosocial & medical history were reviewed as required by Medicare.    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    home safety, activities of daily living, seatbelt use , and smoke alarm employment : No home safety issues; no limitations to activities of daily living; active use of seatbelts and smoke alarms   power of attorney/living will status : Legal documents in place   hearing and vision evaluation : Whisper heard at 6 feet   orientation, memory and mental health assessment : Oriented x3; memory and recall good; the word world was spelled backward; affect and mood are normal.   travel history, immunization status , transfusion history, and preventive health surveillance assessment : Last trouble 1997 in [place]; immunizations not up to date because of a past history of Guillain-Barr syndrome; has history of transfusions with bleeding ulcers in 1967. Colonoscopy up-to-date   pertinent positives and negative items include:   As noted                                                                                    He is here for a scheduled Medicare wellness visit;but  a new finding is atrial flutter with a controlled ventricular rate.       Review of Systems Patient reports no  vision/ hearing changes,anorexia, weight change, fever ,adenopathy, persistant / recurrent hoarseness, swallowing issues, chest pain,palpitations, edema,persistant / recurrent cough, hemoptysis, dyspnea(rest, exertional, paroxysmal nocturnal), gastrointestinal  bleeding (melena, rectal bleeding), abdominal pain, excessive heart burn, GU symptoms( dysuria, hematuria, pyuria, voiding/incontinence   Issues) syncope, focal weakness, memory loss,numbness & tingling, skin/hair/nail changes,depression, anxiety, abnormal bruising/bleeding. He uses no aura and are musculoskeletal symptoms. He inquires about  non prescription treatment of arthritis.     Objective:   Physical Exam General appearance is one of good  Nourishment; he is in no distress. Skull is normocephalic without lymphadenopathy about the head, neck, or axilla. See current vital signs. Ears:  External ear exam shows no significant lesions or deformities.  Otoscopic examination reveals clear canals, tympanic membranes are intact bilaterally without bulging, retraction, inflammation or discharge. Eye - Pupils Equal Round Reactive to light, Extraocular movements intact, Fundi without hemorrhage or visible lesions, Conjunctiva without redness or discharge.No lid lag. .Neck:  No deformities, thyromegaly, masses, or tenderness noted.   Supple with full range of motion without pain. Heart:  Normal rate and regular rhythm despite documented Atrial Flutter. Grade 1/6 systolic murmur.  Lungs:Chest clear to auscultation; no wheezes, rhonchi,rales ,or rubs present.No increased work of breathing. Abdomen: bowel sounds normal, soft and non-tender without masses, organomegaly or hernias noted.  No guarding or rebound Musculoskeletal/Extremities: Gait and station; stability; muscle strength; and  muscle tone are normal. Nail health is good. No onycholysis.There are  no significant deformities of the digits.crepitus & decreased ROM of knees. No cyanosis, clubbing or edema are present. Neurologic exam :Oriented x 3; DTRs WNL .  Genitourinary: Testicles are normal with no associated lesions. The penis & urethra are normal in  appearance without discharge or erythema.Varicocoele on L ; vasectomy scar tissue. Rectal tone is normal. The prostate is  Minimally enlarged ;  there is no induration or nodule present. Psych:  Cognition and judgment appear intact. Alert, communicative  and cooperative with normal attention span and concentration. Skin:  Intact without suspicious lesions or rashes . Raised nevus ant chest.         Assessment & Plan:  #1 Medicare wellness exam; Medicare requirements addressed  #2 atrial flutter, asymptomatic with controlled ventricular rate  #3 dyslipidemia, family history of premature coronary disease/heart attack  #4 GERD  Plan: #1 advanced cholesterol panel will be collected because of the history of dyslipidemia and family history premature heart disease  #2 referral will be made to Cardiology because of the atrial flutter. Until seen he will be asked to take a coated full dose of coated aspirin (325 mg ). This should be taken with a meal because of remote history of ulcer. While taking the aspirin he should be on Nexium each day.

## 2011-02-09 ENCOUNTER — Ambulatory Visit (INDEPENDENT_AMBULATORY_CARE_PROVIDER_SITE_OTHER): Payer: Medicare Other | Admitting: Internal Medicine

## 2011-02-09 ENCOUNTER — Encounter: Payer: Self-pay | Admitting: Internal Medicine

## 2011-02-09 DIAGNOSIS — I4892 Unspecified atrial flutter: Secondary | ICD-10-CM

## 2011-02-09 DIAGNOSIS — I452 Bifascicular block: Secondary | ICD-10-CM

## 2011-02-09 DIAGNOSIS — I444 Left anterior fascicular block: Secondary | ICD-10-CM | POA: Insufficient documentation

## 2011-02-09 NOTE — Progress Notes (Signed)
HPI: Troy Garrison is a 70 y.o. male Seen at the request of Dr. Alwyn Ren because of newly identified atrial flutter at his annual physical examination last week.  The patient notes no associated symptoms.  The patient denies chest pain, shortness of breath, nocturnal dyspnea, orthopnea or peripheral edema.  There have been no palpitations, lightheadedness or syncope.   Thromboembolic risk factors are normal for age-68, and are otherwise negative  The patient has a history of a remote echo cardiogram about 15 years ago undertaken for flutters. This was apparently normal  Current Outpatient Prescriptions  Medication Sig Dispense Refill  . acetaminophen (TYLENOL) 500 MG tablet Take 500 mg by mouth as needed. As needed for joint pain       . aspirin 325 MG tablet Take 325 mg by mouth daily.        Marland Kitchen esomeprazole (NEXIUM) 40 MG capsule Take 40 mg by mouth daily as needed.       . Omega-3 Fatty Acids (FISH OIL) 500 MG CAPS Take 500 mg by mouth once.        . orlistat (ALLI) 60 MG capsule Take 60 mg by mouth daily as needed.       . pravastatin (PRAVACHOL) 40 MG tablet Take 40 mg by mouth daily. 1/2 by mouth once daily         No Known Allergies  Past Medical History  Diagnosis Date  . Hemorrhoids   . Guillain-Barre 2003  . Peptic ulcer 1964    with bleed ; transfused  . Osteoarthritis   . Left axis deviation 1999  . Dyslipidemia   . Family history of premature coronary artery disease   . Hearing loss, neural     Left ear  . Injury of tendon of long head of biceps     Past Surgical History  Procedure Date  . Vasectomy     Scar tissue removed 2 years after the vasectomy  . Replacement total knee 2000    Left Knee  . Colonoscopy 2004, 2011    Internal hemorrhoids    Family History  Problem Relation Age of Onset  . Heart attack Father 68  . Heart failure Father     Died @ 35  . Lupus Mother   . Heart attack Brother 38  . Diabetes Brother   . Prostate cancer Paternal  Grandfather     23  . Cancer Maternal Uncle     X2, unknown primary  . Stroke Maternal Grandmother     History   Social History  . Marital Status: Married    Spouse Name: N/A    Number of Children: N/A  . Years of Education: N/A   Occupational History  . Not on file.   Social History Main Topics  . Smoking status: Former Smoker    Quit date: 10/24/1992  . Smokeless tobacco: Not on file  . Alcohol Use: No  . Drug Use: No  . Sexually Active: Not on file   Other Topics Concern  . Not on file   Social History Narrative  . No narrative on file    Fourteen point review of systems was negative except as noted in HPI and PMH   PHYSICAL EXAMINATION  Blood pressure 157/95, pulse 80, height 6' (1.829 m), weight 249 lb 1.9 oz (113 kg).   Well developed and nourished elderly Caucasian male moderately obese appearing stated agen no acute distress HENT normal Neck supple with JVP-flat Carotids brisk and full without  bruits Back without scoliosis or kyphosis Clear Irregularly irregular rate and rhythm, no murmurs or gallops Abd-soft with active BS without hepatomegaly or midline pulsation Femoral pulses 2+ distal pulses intact Scar on l knee No Clubbing cyanosis edema Skin-warm and dry LN-neg submandibular and supraclavicular A & Oriented CN 3-12 normal  Grossly normal sensory and motor function Affect engaging .  ECG demonstrates atrial flutter-typical with a cycle length of 240 ms ventricular rate is 80 with 3:1 conduction there is right bundle branch block left anterior fascicular block

## 2011-02-09 NOTE — Assessment & Plan Note (Signed)
The patient has a symptomatic atrial flutter. He is a CHADS VASC score of one. I've recommended that we consider catheter ablation as the risk benefit in my mind favors direct therapy. We discussed the risks and benefits of the procedure. He would like to consider this.  In the interim, as he is rate controlled, we will undertake an echo to look at left ventricular function and left atrial dimensions.  He takes aspirin already. He will continue.

## 2011-02-09 NOTE — Patient Instructions (Signed)
Your physician recommends that you continue on your current medications as directed. Please refer to the Current Medication list given to you today.  Your physician has requested that you have an echocardiogram. Echocardiography is a painless test that uses sound waves to create images of your heart. It provides your doctor with information about the size and shape of your heart and how well your heart's chambers and valves are working. This procedure takes approximately one hour. There are no restrictions for this procedure.  Your physician recommends that you schedule a follow-up appointment in: PT TO CALL IF WISHES TO PROCEED WITH POSSIBLE ABLATION.

## 2011-02-10 ENCOUNTER — Encounter: Payer: Self-pay | Admitting: Internal Medicine

## 2011-02-17 ENCOUNTER — Ambulatory Visit (HOSPITAL_COMMUNITY): Payer: Medicare Other

## 2011-02-17 ENCOUNTER — Ambulatory Visit (HOSPITAL_COMMUNITY): Payer: Medicare Other | Attending: Internal Medicine

## 2011-02-17 ENCOUNTER — Encounter: Payer: Self-pay | Admitting: Cardiology

## 2011-02-17 ENCOUNTER — Ambulatory Visit (INDEPENDENT_AMBULATORY_CARE_PROVIDER_SITE_OTHER): Payer: Medicare Other | Admitting: Cardiology

## 2011-02-17 VITALS — BP 152/94 | HR 79 | Ht 72.0 in | Wt 250.0 lb

## 2011-02-17 DIAGNOSIS — I4892 Unspecified atrial flutter: Secondary | ICD-10-CM

## 2011-02-17 DIAGNOSIS — Z8249 Family history of ischemic heart disease and other diseases of the circulatory system: Secondary | ICD-10-CM | POA: Insufficient documentation

## 2011-02-17 DIAGNOSIS — Z87891 Personal history of nicotine dependence: Secondary | ICD-10-CM | POA: Insufficient documentation

## 2011-02-17 NOTE — Progress Notes (Signed)
History of Present Illness: Primary Electrophysiologist:  Dr. Sherryl Manges  Troy Garrison is a 70 y.o. male with newly diagnosed atrial flutter here for his echo today and noted to have a heart rate of 150.  He was added on to the DOD schedule and I was asked to see him.  He felt a little flushed today.  Otherwise he feels fine.  No palpitations.  No chest pain or dyspnea.  No syncope, orthopnea or PND.  No significant edema.  We got an EKG on him when we put him in the room and his VR is 79.  I walked him around the office and his HR went to 95 maximum.  He felt fine throughout.  Past Medical History  Diagnosis Date  . Hemorrhoids   . Guillain-Barre 2003  . Peptic ulcer 1964    with bleed ; transfused  . Osteoarthritis   . Left axis deviation 1999  . Dyslipidemia   . Family history of premature coronary artery disease   . Hearing loss, neural     Left ear  . Injury of tendon of long head of biceps     Current Outpatient Prescriptions  Medication Sig Dispense Refill  . aspirin 325 MG tablet Take 325 mg by mouth daily.        Marland Kitchen esomeprazole (NEXIUM) 40 MG capsule Take 40 mg by mouth daily as needed.       . Omega-3 Fatty Acids (FISH OIL) 500 MG CAPS Take 500 mg by mouth once.        . orlistat (ALLI) 60 MG capsule Take 60 mg by mouth daily as needed.       . pravastatin (PRAVACHOL) 40 MG tablet Take 20 mg by mouth daily.       Marland Kitchen acetaminophen (TYLENOL) 500 MG tablet Take 500 mg by mouth as needed. As needed for joint pain         No Known Allergies  Vital Signs: BP 152/94  Pulse 79  Ht 6' (1.829 m)  Wt 250 lb (113.399 kg)  BMI 33.91 kg/m2  PHYSICAL EXAM: Well nourished, well developed, in no acute distress HEENT: normal Neck: no JVD Cardiac:  normal S1, S2; RRR; no murmur Lungs:  clear to auscultation bilaterally, no wheezing, rhonchi or rales Abd: soft, nontender, no hepatomegaly Ext: trace bilateral edema Skin: warm and dry Neuro:  CNs 2-12 intact, no focal  abnormalities noted  EKG:  Atrial flutter, ventricular rate 79, variable AV block  ASSESSMENT AND PLAN:

## 2011-02-17 NOTE — Patient Instructions (Signed)
Follow up as scheduled.  

## 2011-02-17 NOTE — Assessment & Plan Note (Signed)
Controlled ventricular response.  We briefly discussed the possibility of starting a CCB or beta blocker.  However, with his well controlled rate, I made no medication changes today.  He can proceed with his echocardiogram.

## 2011-02-21 ENCOUNTER — Telehealth: Payer: Self-pay | Admitting: Internal Medicine

## 2011-02-21 NOTE — Telephone Encounter (Signed)
Per pt returning pam called. [phone].

## 2011-02-21 NOTE — Telephone Encounter (Signed)
Patient would like for Dr. Graciela Husbands to review his echo. If he feels it is still necessary, he would like for [place] to call him to set up his ablation.

## 2011-02-21 NOTE — Telephone Encounter (Signed)
Echo results

## 2011-02-22 ENCOUNTER — Encounter: Payer: Self-pay | Admitting: Physician Assistant

## 2011-02-22 NOTE — Telephone Encounter (Signed)
I spoke with Dr. Graciela Husbands about this over the phone and made him aware of the pt's EF per echo. He states that considering the pt's symptoms, he would recommend proceeding with a-flutter ablation. I will call the patient to set this up.

## 2011-02-28 ENCOUNTER — Telehealth: Payer: Self-pay | Admitting: *Deleted

## 2011-02-28 NOTE — Telephone Encounter (Signed)
Lmtcb./cy 

## 2011-02-28 NOTE — Telephone Encounter (Signed)
Pt returning your call

## 2011-02-28 NOTE — Telephone Encounter (Signed)
LMTCB RE ECHO RESULTS AND TX PLAN PER DR Graciela Husbands .Zack Seal

## 2011-03-01 ENCOUNTER — Other Ambulatory Visit: Payer: Self-pay | Admitting: *Deleted

## 2011-03-01 ENCOUNTER — Encounter: Payer: Self-pay | Admitting: *Deleted

## 2011-03-01 DIAGNOSIS — Z01810 Encounter for preprocedural cardiovascular examination: Secondary | ICD-10-CM

## 2011-03-01 DIAGNOSIS — Z79899 Other long term (current) drug therapy: Secondary | ICD-10-CM

## 2011-03-01 NOTE — Telephone Encounter (Signed)
PT AWARE WILL PROCEED WITH A FLUTTER ABLATION ON 04/13/11 AT 6:30 -8:30 AM  PT TO START PRADAXA 150 MG BID ON 03/23/11 AND TO HAVE  CBC AND  BMET DONE ON 04/02/11 AND ALSO WILL HAVE PRE PROCEDURE LABS DONE ON 04/08/11.INSTRUCTIONS AND PRADAXA SAMPLES LEFT AT FRONT DESK FOR PT TO PICK UP./CY

## 2011-03-02 NOTE — Telephone Encounter (Signed)
Jilda Panda called the pt and scheduled him for an a-flutter ablation for 04/13/11. Sherri Rad, RN, BSN

## 2011-03-08 NOTE — Assessment & Plan Note (Signed)
[facility]                        [place] OFFICE NOTE   NAME:RHODESMarland Kitchen JARTAVIOUS Garrison                     MRN:          [id number]  DATE:03/05/2007                            DOB:          1941-01-10    Troy Garrison was seen for a comprehensive physical examination on Mar 05, 2007.   Active issues include neck pain he has had for several months.  He has  employed acupuncture and massage with temporary relief.  He has been  taking ibuprofen up to four a day at bedtime.   He does take Nexium as needed for heartburn.  He denies any recent  flare.  He states that steak seems to be the major trigger.  He does  drink decaf coffee and decaf tea.  He does not drink or smoke.   His past history is unchanged.  He has had a vasectomy and then  subsequent scar tissue revision.  He had a total knee replacement in  2000.  In 2003, he was hospitalized with Guillain-Barre syndrome.  He  had a bleeding ulcer in 1967 while in college.  Colonoscopy in 2004  revealed internal hemorrhoids.  He also has degenerative joint disease.  Remotely, he has had mild increase in alkaline phosphatase.   Prostate cancer was found in a paternal grandfather.  Maternal uncles  had cancer of unknown primary.  His father had a heart attack at 58 and  died of congestive heart failure.  A brother had a heart attack at 69  and had bypass at 71.  Maternal grandmother had stroke.  His mother had  lupus.   He has exercised on a treadmill five days a week for 45 to 60 minutes  with no cardiopulmonary symptoms.   He is presently on over-the-counter preparation Alli, multivitamins, 325  mg of aspirin, and Niaspan 1000.   The remainder of the review of systems is negative.   He is on no specific diet.   PHYSICAL EXAMINATION:  VITAL SIGNS:  Weight is up a pound and a half to  269.6.  Pulse is 56, respiratory rate 15, and blood pressure 130/80.  HEENT:  He has mild arteriolar  narrowing on the fundal exam.  There is  increased cerumen in the right otic canal.  Nares are patent.  Dental  hygiene is good.  There is erythema of the posterior pharynx.  NECK:  Thyroid is normal to palpation; he has no lymphadenopathy about  the neck or axilla.  HEART:  He does have a grade 1/2-1 systolic murmur.  CHEST:  Clear to auscultation.  ABDOMEN:  He has no organomegaly or masses.  The abdomen is nontender.  EXTREMITIES:  He has mild crepitus of the knees.  He has full range of  motion of all extremities and his neck, but he does describe some pain  in the posterior neck with flexion or lateral rotation.  Deep tendon  reflexes are normal, and strength is normal to opposition.  RECTAL:  There is a small varicocele on the left; prostate is asymmetric  with the right hemisphere being small.  Hemoccult testing is  negative.   I would encourage Bill not to take ibuprofen at bedtime on an empty  stomach as it would put him at high risk for gastritis or recurrent  ulcer.  The safest thing to take at night would be arthritis-strength  Tylenol.  Additionally, the ibuprofen will negate the cardiac benefit of  the aspirin.  Regular high-dose ibuprofen also would be associated not  only with GI risks of perforation or bleed but also cardiovascular  risks.  This is important because of the family history.   I would ask him to consider chiropractic treatments for the neck, as he  has had only partial relief with acupuncture and massage.  I would also  recommend a cervical pillow.   Based on his NMR, his LDL should be less than 100, particularly in view  of the family history.   Further recommendations will depend on the results of labs.     Titus Dubin. Alwyn Ren, MD,FACP,FCCP  Electronically Signed    [facility]/MedQ  DD: 03/05/2007  DT: 03/05/2007  Job #: [id number]

## 2011-03-11 NOTE — Op Note (Signed)
   NAME:  Troy Garrison, Troy Garrison NO.:  [id number]   MEDICAL RECORD NO.:  [id number]                   PATIENT TYPE:  INP   LOCATION:  3110                                 FACILITY:  [facility]   PHYSICIAN:  Charlcie Cradle. Delford Field, M.D. [facility]         DATE OF BIRTH:  05/16/1941   DATE OF PROCEDURE:  06/06/2002  DATE OF DISCHARGE:                                 OPERATIVE REPORT   PROCEDURE PERFORMED:  Bronchoscopy.   OPERATOR:  Charlcie Cradle. Delford Field, M.D. [facility]   INDICATIONS FOR PROCEDURE:  Mucous plugging and atelectasis.   ANESTHESIA:  1% Xylocaine local.   PREOP MEDICATIONS:  Versed 3 mg IV push, fentanyl  100 mcg IV push.   DESCRIPTION OF PROCEDURE:  The portable Olympus bronchoscope was introduced  through the endotracheal tube.  Thick tenacious mucous plugs were removed  from both lungs, particularly in the right lower lung. These areas were  suctioned free.  No endobronchial lesions were seen.  Severe  tracheobronchitis was identified.   COMPLICATIONS:  None.   IMPRESSION:  Bilateral mucous plugging, right greater than left.   RECOMMENDATIONS:  Follow up microbiologic data.                                                 Charlcie Cradle Delford Field, M.D. [facility]    PEW/MEDQ  D:  06/06/2002  T:  06/08/2002  Job:  [id number]   cc:   Deanna Artis. Sharene Skeans, M.D.   Isla Pence, M.D. [facility]

## 2011-03-11 NOTE — Op Note (Signed)
   NAME:  Troy Garrison, Troy Garrison NO.:  [id number]   MEDICAL RECORD NO.:  [id number]                   PATIENT TYPE:  INP   LOCATION:  2908                                 FACILITY:  [facility]   PHYSICIAN:  Deanna Artis. Sharene Skeans, M.D.           DATE OF BIRTH:  09-11-1941   DATE OF PROCEDURE:  06/03/2002  DATE OF DISCHARGE:                                 OPERATIVE REPORT   PROCEDURE PERFORMED:  Lumbar puncture.   OPERATOR:  Deanna Artis. Sharene Skeans, M.D.   INDICATIONS FOR PROCEDURE:  Progressive weakness, evaluate for Guillain-  Barre.   DESCRIPTION OF PROCEDURE:  After informed consent, the patient was sterilely  prepped and draped and local anesthesia was instilled in the L3-4  interspace.  There was some difficulty passing between the bones; however,  the subarachnoid space was entered on the first pass without apparent  trauma.   13 cc of clear colorless fluid was obtained and sent to the laboratory.  Opening pressure 160 mmH2O.  Closing pressure was 100 mmH2O.  The patient  tolerated the procedure well.  Sample will be sent for culture, Gram stain,  glucose, protein, cryptococcal antigen,  VDRL, cell count and differential  and fluid will also be saved in the event other etiologies are considered.                                               Deanna Artis. Sharene Skeans, M.D.    [facility]  D:  06/03/2002  T:  06/06/2002  Job:  [id number]

## 2011-03-11 NOTE — Op Note (Signed)
   NAME:  Troy Garrison, Troy Garrison NO.:  [id number]   MEDICAL RECORD NO.:  [id number]                   PATIENT TYPE:  INP   LOCATION:  3110                                 FACILITY:  [facility]   PHYSICIAN:  Charlcie Cradle. Delford Field, M.D. [facility]         DATE OF BIRTH:  Jan 01, 1941   DATE OF PROCEDURE:  06/07/2002  DATE OF DISCHARGE:                                 OPERATIVE REPORT   PROCEDURE:  Bronchoscopy.   INDICATIONS:  Right lower lobe collapse and mucus plugging.   OPERATOR:  Charlcie Cradle. Delford Field, M.D.   ANESTHESIA:  1% Xylocaine local.   PREOPERATIVE MEDICATIONS:  Versed 3 mg IV push, fentanyl 50 mcg IV push.   DESCRIPTION OF PROCEDURE:  The bedside Olympus bronchoscope was introduced  through the endotracheal tube.  The airways were visualized and showed less  mucus plugging compared to previous exam.  There was mucus seen in the right  lower lung zone, which was more watery in nature.  This was suctioned free.  No endobronchial lesions were seen.   COMPLICATIONS:  None.   IMPRESSION:  Mucus plugging, right lower lobe.   RECOMMENDATIONS:  Follow up microbiological data.                                               Charlcie Cradle Delford Field, M.D. [facility]    [facility]  D:  06/07/2002  T:  06/10/2002  Job:  [id number]
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
## 2011-03-11 NOTE — H&P (Signed)
NAME:  Troy Garrison, Troy Garrison NO.:  [id number]   MEDICAL RECORD NO.:  [id number]                   PATIENT TYPE:  EMS   LOCATION:  MAJO                                 FACILITY:  [facility]   PHYSICIAN:  Marga Melnick, M.D.                DATE OF BIRTH:  February 08, 1941   DATE OF ADMISSION:  06/03/2002  DATE OF DISCHARGE:                                HISTORY & PHYSICAL   PRIMARY CARE PHYSICIAN:  Dr. Alwyn Ren.   ADMISSION DIAGNOSES:  Weakness.   CHIEF COMPLAINT:  Weakness and numbness in the hands and feet, tongue feels  thick.   HISTORY OF PRESENT ILLNESS:  This 70 year old man was in his normal state of  health up to approximately 2 days ago when he started to feel numbness in  his hands and feet bilaterally.  The numbness started on the dorsum of his  hands and the dorsum of his feet, progressed to the plantar surfaces and now  seems to be creeping up his forearms and the extent of his legs.  He also  senses that while his speech is not slurred that his tongue feels thicker.  He calls it a paralysis.  He denies any flu shots or viral infections over  the past 6 months.  He says that his symptoms are worsening even as we  speak.   ALLERGIES:  None.   PAST SURGICAL HISTORY:  Left knee repair by Dr. Juliene Pina.   PAST MEDICAL HISTORY:  He states that he has no medical history.   MEDICATIONS:  He uses Zenofil on occasion for weight loss.  He uses over-the-  counter multiple vitamins and aspirin on a p.r.n. basis.   SOCIAL HISTORY:  Married, 4 adult children.  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   FAMILY HISTORY:  Father with heart disease, died at age 31.  Mother had  lupus.  Sister is alive and well, brother has a cardiac history with an MI  in his 56's.  Health maintenance, last physical was March 2002 with Dr.  Alwyn Ren.   LABORATORY DATA:  CBC normal except for an AST of 48 and an ALT of 69, PT  and INR are 12.4 and 0.9 respectively.  Head CT  shows atrophy with no acute  process.   PHYSICAL EXAMINATION:  VITAL SIGNS:  On exam vital signs are 97 degrees, 20,  154/82, heart rate in the 90's, pulse ox is 97% on room air.  GENERAL:  He  is an awake, alert, oriented, pleasant male who is cooperative with the  exam.  NECK:  He has a thick neck, no adenopathy palpable.  NEUROLOGIC:  PERRLA.  ROM are intact bilaterally.  CN II-XII are intact  slowly.  The patient reports that while performing the movements he requires  more effort than he expected.  He also confused the sharp and dull on the  right dorsum  of his foot.  CARDIOVASCULAR:  No carotid bruits are  auscultated.  His heart has a regular rate and rhythm.  LUNGS:  His lungs  are clear to auscultation.  ABDOMEN:  Large with bowel sounds, soft and  nontender.  EXTREMITIES:  He has difficulty standing and was assisted back  into the gurney.  Ambulation was deferred.  He has no edema in his  extremities.  SKIN:  His skin is warm and dry.   PLAN:  Neuro.  He has bilateral paresthesias, new onset.  There is a  question of Guillain-Barre or other etiology and neurology has been  consulted to assist [place] in this case.                                                Marga Melnick, M.D.    [facility]  D:  06/03/2002  T:  06/06/2002  Job:  [id number]   cc:   Titus Dubin. Alwyn Ren, M.D. [facility]   Isla Pence, M.D. [facility]

## 2011-03-11 NOTE — Discharge Summary (Signed)
NAME:  Troy Garrison, Troy Garrison NO.:  [id number]   MEDICAL RECORD NO.:  [id number]                   PATIENT TYPE:  INP   LOCATION:  3019                                 FACILITY:  [facility]   PHYSICIAN:  Deanna Artis. Sharene Skeans, M.D.           DATE OF BIRTH:  01-28-1941   DATE OF ADMISSION:  06/03/2002  DATE OF DISCHARGE:  06/14/2002                                 DISCHARGE SUMMARY   DISCHARGE DIAGNOSIS:  Guillain-Barre' - code #357.0.   PROCEDURE:  1. A CT scan of the brain.  2. MRI of the cervical spine.  3. Lumbar puncture.  4. Endotracheal intubation.   COMPLICATIONS:  None.   HISTORY OF PRESENT ILLNESS:  The patient is a 70 year old married gentleman  who was admitted with acute progressive weakness that began with numbness in  his hands and feet, and progressed to a gait disorder, and significant  weakness proximally and distally in his extremities.  The patient had his  reflexes preserved, and also had apparent bilateral extensor plantar  responses, which raised the question of critical cervical spinal stenosis.   HOSPITAL COURSE:  An MRI scan of the cervical spine showed normal alignment  of the spine.  No evidence of swelling, abnormal T2 signal, or contrast  enhancement.  The patient had evidence of some arthritis involving  uncovertebral degenerative disease at C2-C3.  At C3-C4 the patient had  asymmetric facet degeneration on the right with edema and enhancement and  right-sided spondylosis with uncovertebral osteophytes on the right,  neuroforaminal stenosis on the right due to the fact that the right C4 nerve  root canal was slightly narrowed but the cord was not compressed or  deformed.  C4-5 mild spondylosis with disk bulge, small osteophytes,  subarachnoid space surrounded the cord without deformity, mild neural  foraminal narrowing bilaterally from the C5 nerve roots.  C5-6 moderate  spondylosis, mild to moderate osteophytic encroachment of  the neural  foraminae bilaterally.  C6-7 moderate spondylosis.  C7-T1 normal.  Degenerative changes in the upper thoracic region are seen, without  significant compromise of the canal.  No abnormal enhancement.  The  conclusion was no evidence of critical cervical spinal stenosis, and mild to  moderate spondylitic changes.  A CT scan of the brain showed no intracranial abnormality.  The patient underwent a lumbar puncture to look for protein cytologic  dissociation.  The lumbar tap was carried out atraumatically with one red  blood cell, one white blood cell, glucose 61, protein 34.  CSF VDRL was  nonreactive.  All cultures on the spinal fluid were negative.  Other laboratory studies included a TSH 1.710, vitamin B12 of 631, folic  acid greater than 20.  Prealbumin dropped from 15.1 to 12.7 between the 14th  and the 19th of August.  The patient developed a significant respiratory distress and required  endotracheal intubation, and was on the ventilator under sedation on June 04, 2002, until June 11, 2002.  He was able to be weaned from the  ventilator and tolerated extubation quite well.  The patient initially was  given tube feedings with Jevity, because he had dysphagia.  At the apex of  his symptoms, he had some weakness in his eye lids, his swallowing, his  extraocular movements, and showed no better than 2/5 strength proximally,  with the exception of the right triceps at 4-.  In both the upper and lower  extremities distally he was 0/5.  A clinical diagnosis of Guillain-Barre' was made.  A decision was made not  to perform nerve conductions and EMGs, or carry out further lumbar  punctures.  The patient was placed on IVIG 0.4 g per kg, and was given 50 g  of IVIG per day for five days.  He tolerated this well without significant  side effects, and over that period of time, he began to improve in his  strength.  His electrocardiogram showed a normal sinus rhythm with left axis  deviation.  No other abnormalities were seen.  A chest x-ray showed some atelectasis  which required two bronchoscopy procedures in order to open up his lower  lobes.  These were performed by Dr. Charlcie Cradle. Delford Field.  The patient received  appropriate antibiotics.  A KUB on June 05, 2002, showed colonic  dilatation compatible with ileus.  The patient was treated with a bowel  program, and began to evacuate his bowels.  He had nearly daily cbc's.  A  low white count at 5900, high white count of 13,200.  Hemoglobin dropped  from 15.6 down to a low of 11.8 before rebounding to 14.2.  Some of this may  very well have been dilutional artifact.  The patient did not ever show  significant shift in the differential, so those counts will not be reported.  The sedimentation rate was 3.  Prothrombin time 12.4, PTT 34.  Sodium ranged  between 134 and 139.  Potassium between 3.7 and 4.5.  Chloride between 99  and 107, bicarbonate between 27 and 30.  Glucose was elevated slightly by  initially a random glucose of 107, peak glucose measured 246, which dropped  to 125 on the last day that it was evaluated on June 14, 2002.  BUN was  within normal limits between 9 and 20.  Creatinine normal between 0.7 and  0.9.  Calcium ranged from 8.0 up to 9.3.  Total protein dropped from 7.1  down to 6.5.  Albumin dropped from 3.7 down to 2.1.  AST dropped from 48  down to 24.  ALT dropped from 69 to 29.  The ALT remained within normal  range between 52 and 92.  Total bilirubin 0.5, direct bilirubin 0.2,  indirect 0.3.  The patient had a steady course of improvement in the hospital.   DISPOSITION:  He was seen by Dr. Reuel Boom L. Collins on June 13, 2002, and  accepted for a transfer to rehabilitation on June 14, 2002.  On the day of  his transfer his blood pressure was 130/87, pulse 80, respirations 20,  temperature 97.7 degrees.  Pulse oximetry 94%.  His lungs were clear.  The heart had no murmurs.  Pulses  normal.  Abdomen soft.  Bowel sounds normal.  The extremities were negative.  The neurological examination revealed he was  awake, alert, pleasant, in no acute distress.  Cranial nerves:  Round,  reactive pupils.  Fundi normal.  Visual fields full to double simultaneous  stimuli.  Extraocular movements full.  Symmetric facial strength.  Midline  tongue and uvula.  Air conduction greater than bone conduction.  Motor  examination:  Upper extremities deltoids 4, triceps, biceps, wrist  extensors, and flexors were +5.  The patient could appose his thumbs, all  but his fifth finger.  Lower extremities:  Knee flexors 4-.  Foot  dorsiflexors 4, knee flexors and extensors and foot plantar flexors 5.  Sensory:  Peripheral stocking neuropathy, and he complained of paresthesias  in his feet.  Cerebellar:  Finger-to-nose and rapid repetitive movements  were normal.  Gait:  He walked with a walker.  He was areflexic.  No  response to plantar stimulation.   CONDITION ON TRANSFER:  The patient was transferred to rehabilitation in  good condition medically, and in an improving condition neurologically.                                                 Deanna Artis. Sharene Skeans, M.D.    [facility]  D:  06/25/2002  T:  06/25/2002  Job:  [id number]

## 2011-03-11 NOTE — Discharge Summary (Signed)
NAME:  Troy Garrison, Troy Garrison NO.:  [id number]   MEDICAL RECORD NO.:  [id number]                   PATIENT TYPE:  IPS   LOCATION:  4007                                 FACILITY:  [facility]   PHYSICIAN:  Rande Brunt. Thomasena Edis, M.D.             DATE OF BIRTH:  04/16/1941   DATE OF ADMISSION:  06/14/2002  DATE OF DISCHARGE:  06/18/2002                                 DISCHARGE SUMMARY   DISCHARGE DIAGNOSES:  1. Guillain-Barre syndrome.  2. Aspiration pneumonia, resolved.  3. Obesity.  4. History of left total knee replacement.   HPI:  70 year old white male admitted on June 03, 2002 with two-day period  of numbness and generalized weakness to the hands and legs.  He denied flu  or viral infection.  No chest pain or shortness of breath.  Upon evaluation  cranial CT scan was negative.  Cervical spine MRI negative for a myelopathy.  Lumbar puncture showed protein 34, glucose 61, suspect Guillain-Barre  syndrome, placed on IVIG therapy.   HOSPITAL COURSE:  With aspiration pneumonia he completed a course of  intravenous Zosyn, completed June 14, 2002.  Modified barium swallow  June 12, 2002, cleared for a regular diet.  He had no voiding difficulty.  Chemistries unremarkable.  He was admitted for a comprehensive rehab  program.   PAST MEDICAL HISTORY:  Benign.   PAST SURGICAL HISTORY:  Left total knee replacement.   ALLERGIES:  None.   PRIMARY CARE PHYSICIAN:  Jie Stickels. Alwyn Ren, M.D. [facility]   MEDICATIONS PRIOR TO ADMISSION:  Zenofil for weight loss.   SOCIAL HISTORY:  Denies alcohol or tobacco.  Lives with wife in [place].  Independent prior to admission.  Self-employed Doctor, general practice.  Lives in a one level home, one to two steps to entry.  Wife, family, and  friends can assist on discharge.   HOSPITAL COURSE:  The patient did well with rehabilitation services with  therapies initiated on a b.i.d. basis.  The following issues were followed  during patient's rehab course.  Pertaining to the patient's Guillain-Barre  syndrome, continued to make excellent progress.  His subcutaneous Lovenox  that he was initially on for deep vein thrombosis prophylaxis was  discontinued as his mobility improved.  He was now modified independent in  his room.  Endurance and strength had greatly improved.  He would follow up  neurology services, Dr. Deanna Artis. Hickling.  He would continue on Neurontin  for some ongoing numbness of the hands and fingers, which again had greatly  improved.  He will be discharged to home with arrangements for outpatient  therapies.  He had completed his course of intravenous Zosyn for aspiration  pneumonia.  Follow-up chest x-ray showed no infiltrate.  He had a history of  a left total knee replacement.  This was without issue during his  rehabilitation stay.  There were no bowel or bladder disturbances.  No  swallowing difficulties.  Blood pressure remained well-controlled without  the use of antihypertensive medications.  He would follow up as advised at  the outpatient office of neurology services.   LABS:  Latest labs showed a sodium 141, potassium 4.0, BUN 13, creatinine  0.8, hemoglobin 13.6, hematocrit 40.7.   DISCHARGE MEDICATIONS:  Neurontin 300 mg two tablets three times day,  Multivitamin daily, Tylenol as needed.   ACTIVITY:  As tolerated.   DIET:  Regular.   SPECIAL INSTRUCTIONS:  No driving.  Outpatient therapy is arranged as per  rehab services.  The patient should follow-up with Dr. Deanna Artis. Hickling,  [phone] in two weeks and Dr. Marga Melnick for medical management.     Mcarthur Rossetti. Angiulli, P.A.                  Daniel L. Thomasena Edis, M.D.    DJA/MEDQ  D:  06/18/2002  T:  06/20/2002  Job:  [id number]   cc:   Titus Dubin. Alwyn Ren, M.D. [facility]   Deanna Artis. Sharene Skeans, M.D.  [address]  [place]  [place] [address]  Fax: [phone]   Rande Brunt. Thomasena Edis, M.D.  [address]  [place]   [place] [address]  Fax: [phone]

## 2011-03-30 ENCOUNTER — Encounter: Payer: Self-pay | Admitting: Gastroenterology

## 2011-04-01 ENCOUNTER — Other Ambulatory Visit (INDEPENDENT_AMBULATORY_CARE_PROVIDER_SITE_OTHER): Payer: Medicare Other | Admitting: *Deleted

## 2011-04-01 DIAGNOSIS — Z79899 Other long term (current) drug therapy: Secondary | ICD-10-CM

## 2011-04-01 LAB — CBC WITH DIFFERENTIAL/PLATELET
Basophils Relative: 0.5 % (ref 0.0–3.0)
Eosinophils Absolute: 0.1 10*3/uL (ref 0.0–0.7)
Eosinophils Relative: 1.1 % (ref 0.0–5.0)
HCT: 44 % (ref 39.0–52.0)
Hemoglobin: 15 g/dL (ref 13.0–17.0)
Lymphs Abs: 1.7 10*3/uL (ref 0.7–4.0)
MCHC: 34.1 g/dL (ref 30.0–36.0)
MCV: 92.4 fl (ref 78.0–100.0)
Monocytes Absolute: 0.5 10*3/uL (ref 0.1–1.0)
Neutro Abs: 4 10*3/uL (ref 1.4–7.7)
Neutrophils Relative %: 63.2 % (ref 43.0–77.0)
RBC: 4.77 Mil/uL (ref 4.22–5.81)
WBC: 6.3 10*3/uL (ref 4.5–10.5)

## 2011-04-01 LAB — BASIC METABOLIC PANEL
CO2: 29 mEq/L (ref 19–32)
Chloride: 106 mEq/L (ref 96–112)
Creatinine, Ser: 0.8 mg/dL (ref 0.4–1.5)
Potassium: 5 mEq/L (ref 3.5–5.1)

## 2011-04-08 ENCOUNTER — Other Ambulatory Visit (INDEPENDENT_AMBULATORY_CARE_PROVIDER_SITE_OTHER): Payer: Medicare Other | Admitting: *Deleted

## 2011-04-08 DIAGNOSIS — Z01810 Encounter for preprocedural cardiovascular examination: Secondary | ICD-10-CM

## 2011-04-08 DIAGNOSIS — I4892 Unspecified atrial flutter: Secondary | ICD-10-CM

## 2011-04-08 LAB — BASIC METABOLIC PANEL
BUN: 15 mg/dL (ref 6–23)
Creatinine, Ser: 0.8 mg/dL (ref 0.4–1.5)
GFR: 105.23 mL/min (ref >60.00)
Glucose, Bld: 97 mg/dL (ref 70–99)
Potassium: 4.8 mEq/L (ref 3.5–5.1)

## 2011-04-08 LAB — CBC WITH DIFFERENTIAL/PLATELET
Basophils Absolute: 0 10*3/uL (ref 0.0–0.1)
Eosinophils Relative: 1.2 % (ref 0.0–5.0)
HCT: 43.9 % (ref 39.0–52.0)
Lymphs Abs: 1.8 10*3/uL (ref 0.7–4.0)
Monocytes Absolute: 0.5 10*3/uL (ref 0.1–1.0)
Monocytes Relative: 7.3 % (ref 3.0–12.0)
Neutrophils Relative %: 64.5 % (ref 43.0–77.0)
Platelets: 242 10*3/uL (ref 150.0–400.0)
RDW: 14.1 % (ref 11.5–14.6)
WBC: 6.8 10*3/uL (ref 4.5–10.5)

## 2011-04-08 LAB — PROTIME-INR
INR: 1.2 ratio — ABNORMAL HIGH (ref 0.8–1.0)
Prothrombin Time: 13.5 s — ABNORMAL HIGH (ref 10.2–12.4)

## 2011-04-13 ENCOUNTER — Ambulatory Visit (HOSPITAL_COMMUNITY)
Admission: RE | Admit: 2011-04-13 | Discharge: 2011-04-13 | Disposition: A | Discharge status: Home / Self Care | Payer: Medicare Other | Source: Ambulatory Visit | Attending: Internal Medicine | Admitting: Internal Medicine

## 2011-04-13 DIAGNOSIS — H9190 Unspecified hearing loss, unspecified ear: Secondary | ICD-10-CM | POA: Insufficient documentation

## 2011-04-13 DIAGNOSIS — Z79899 Other long term (current) drug therapy: Secondary | ICD-10-CM | POA: Insufficient documentation

## 2011-04-13 DIAGNOSIS — Z7902 Long term (current) use of antithrombotics/antiplatelets: Secondary | ICD-10-CM | POA: Insufficient documentation

## 2011-04-13 DIAGNOSIS — I10 Essential (primary) hypertension: Secondary | ICD-10-CM | POA: Insufficient documentation

## 2011-04-13 DIAGNOSIS — M1990 Unspecified osteoarthritis, unspecified site: Secondary | ICD-10-CM | POA: Insufficient documentation

## 2011-04-13 DIAGNOSIS — E785 Hyperlipidemia, unspecified: Secondary | ICD-10-CM | POA: Insufficient documentation

## 2011-04-13 DIAGNOSIS — I4892 Unspecified atrial flutter: Secondary | ICD-10-CM

## 2011-04-13 DIAGNOSIS — Z96659 Presence of unspecified artificial knee joint: Secondary | ICD-10-CM | POA: Insufficient documentation

## 2011-04-26 NOTE — Op Note (Signed)
NAME:  Troy, Garrison NO.:  [id number]  MEDICAL RECORD NO.:  [id number]  LOCATION:  3730                         FACILITY:  [facility]  PHYSICIAN:  Duke Salvia, MD, FACCDATE OF BIRTH:  01-12-1941  DATE OF PROCEDURE:  04/13/2011 DATE OF DISCHARGE:  04/13/2011                              OPERATIVE REPORT   PREOPERATIVE DIAGNOSIS:  Atrial flutter.  POSTOPERATIVE DIAGNOSIS:  Sinus rhythm.  PROCEDURE:  Invasive electrophysiological study, arrhythmia mapping and catheter ablation.  Following obtaining informed consent, the patient was brought to electrophysiology laboratory and placed on the fluoroscopic table in the supine position.  After routine prep and drape, cardiac catheterization was performed with local anesthesia and conscious sedation.  Noninvasive blood pressure monitoring and transcutaneous oxygen saturation monitoring were performed continuously throughout the procedure. Following the procedure, the catheter was removed, hemostasis was obtained and the patient was transferred to the holding area in stable condition.  CATHETERS: 1. A 5-French quadripolar catheter was inserted via the left femoral     vein to the AV junction. 2. A 6-French octapolar catheter was inserted via the right femoral     vein to the coronary sinus. 3. A 7-French dual decapolar catheter was inserted via the left     femoral vein to the tricuspid annulus. 4. A 7-French 8-mm deflectable tip RF catheter was inserted via the     right femoral vein to mapping sites in the cavotricuspid isthmus.  Following insertion of the catheters, a stimulation protocol included incremental atrial pacing. Incremental ventricular pacing Single atrial extrastimuli paced cycle length of 600 milliseconds.  RESULTS:  Surface electrocardiogram and basic intervals. Rhythm:  Atrial flutter:  RR interval 771 milliseconds; AA interval 248 milliseconds; QRS duration 89 milliseconds; QT interval  387 milliseconds; Bundle-branch block:  Absent; HV interval:  48 milliseconds. Rhythm:  Final:  Sinus; RR interval 684 milliseconds; PR interval 187 milliseconds; P-wave duration 137 milliseconds; QRS duration 96 milliseconds; QT interval 400 milliseconds; AH interval 134 milliseconds; HV interval:  46 milliseconds.  AV nodal function.  AV Wenckebach was 500 milliseconds. VA conduction was dissociated at 600 milliseconds. AV nodal conduction was continuous and the effective refractory period is 600 milliseconds with 420 milliseconds.  Accessory pathway function:  No evidence of any accessory pathway was identified.  Ventricular response programed stimulation was normal for ventricular stimulation as previously described.  Catheter ablation.  RF energy was delivered across the cavotricuspid isthmus with initial termination of flutter and subsequent induction of bidirectional cavotricuspid isthmus conduction block.  The patient received a total of 12 minutes and 6 seconds of radiofrequency energy.  Fluoroscopy time, a 11.4 minutes of fluoroscopy time was utilized at 15 to 7.5 frames per second.  IMPRESSION: 1. Normal sinus function. 2. Abnormal atrial function manifested by sustained atrial flutter     with subsequent termination of flutter and interruption of the     cavotricuspid isthmus conduction. 3. Normal atrioventricular nodal function apart from a prolonged AV     interval and a long Wenckebach cycle length. 4. Normal His-Purkinje system function. 5. No accessory pathway. 6. Normal ventricular response to programed stimulation.  SUMMARY CONCLUSION,:  The result of electrophysiological testing and arrhythmia  mapping demonstrated cavotricuspid isthmus conduction and counterclockwise rotation.  Catheter ablation across the isthmus resulted in bidirectional isthmus conduction block.  The patient tolerated the procedure without apparent complication.  His Pradaxa will  be continued for 4 weeks' time.     Duke Salvia, MD, [facility]     SCK/MEDQ  D:  04/13/2011  T:  04/13/2011  Job:  [id number]  Electronically Signed by Sherryl Manges MD [facility] on 04/26/2011 04:34:00 PM

## 2011-04-26 NOTE — Discharge Summary (Signed)
  NAME:  ULYSEES, ROBARTS NO.:  [id number]  MEDICAL RECORD NO.:  [id number]  LOCATION:  3730                         FACILITY:  [facility]  PHYSICIAN:  Duke Salvia, MD, FACCDATE OF BIRTH:  June 15, 1941  DATE OF ADMISSION:  04/13/2011 DATE OF DISCHARGE:  04/13/2011                              DISCHARGE SUMMARY   PRIMARY CARDIOLOGIST:  Duke Salvia, MD, [facility].  PRIMARY CARE PROVIDER:  Dr. Alwyn Ren.  DISCHARGE DIAGNOSIS:  Symptomatic atrial flutter, status post radiofrequency catheter ablation this admission.  SECONDARY DIAGNOSES: 1. Hypertension. 2. History of hemorrhoids. 3. History of Guillain-Barre. 4. Peptic ulcer disease, status post remote bleed. 5. Osteoarthritis. 6. Hyperlipidemia. 7. History of left ear hearing loss. 8. Status post vasectomy. 9. Status post left total knee arthroplasty.  ALLERGIES:  No known drug allergies.  PROCEDURES:  Electrophysiologic study with successful radiofrequency catheter ablation for atrial flutter.  HISTORY OF PRESENT ILLNESS:  A 70 year old male diagnosed with atrial flutter early this year who is evaluated by Dr. Graciela Husbands in clinic in April.  After discussion, decision was made to pursue atrial flutter catheter ablation and the patient was placed on Pradaxa therapy.  HOSPITAL COURSE:  The patient presented to the [facility] Electrophysiology Lab on April 13, 2011, and underwent electrophysiologic study and subsequent radiofrequency catheter ablation by Dr. Graciela Husbands.  The patient tolerated this procedure well and postprocedure had maintained sinus rhythm.  He will remain on Pradaxa for at least another 4 weeks and follow up with Dr. Graciela Husbands within that time frame.  He is being discharged home tonight in good condition.  DISCHARGE LABORATORY DATA:  None.  DISPOSITION:  The patient is being discharged to home in good condition.  FOLLOWUP PLANS AND APPOINTMENTS:  We will arrange followup with Dr. Graciela Husbands in approximately 4  weeks.  Follow up with Dr. Alwyn Ren as previously scheduled.  DISCHARGE MEDICATIONS: 1. Lisinopril 5 mg daily. 2. Multivitamin 1 tab daily. 3. Fish oil 1 tablet daily. 4. Pradaxa 150 mg b.i.d. 5. Pravachol 40 mg daily.  OUTSTANDING LABORATORIES AND STUDIES:  None.  DURATION OF DISCHARGE ENCOUNTER:  40 minutes including physician time.     Nicolasa Ducking, ANP   ______________________________ Duke Salvia, MD, [facility]    CB/MEDQ  D:  04/13/2011  T:  04/14/2011  Job:  [id number]  cc:   Dr. Alwyn Ren.  Electronically Signed by Nicolasa Ducking ANP on 04/15/2011 02:34:20 PM Electronically Signed by Sherryl Manges MD [facility] on 04/26/2011 04:34:05 PM

## 2011-05-16 ENCOUNTER — Encounter: Payer: Self-pay | Admitting: Physician Assistant

## 2011-05-16 ENCOUNTER — Ambulatory Visit (INDEPENDENT_AMBULATORY_CARE_PROVIDER_SITE_OTHER): Payer: Medicare Other | Admitting: Physician Assistant

## 2011-05-16 VITALS — BP 127/84 | HR 81 | Ht 72.0 in | Wt 240.0 lb

## 2011-05-16 DIAGNOSIS — I4892 Unspecified atrial flutter: Secondary | ICD-10-CM

## 2011-05-16 NOTE — Progress Notes (Signed)
History of Present Illness: Primary Electrophysiologist:  Dr. Sherryl Manges  Troy Garrison is a 70 y.o. male who presents for post hospital follow up.   He was recently evaluated by Dr. Graciela Husbands for new onset Atrial Flutter.  Echocardiogram 02/17/11: EF 55-60%, mild LVH, mild RAE, mild to moderate LAE, mild AI, PASP 31.  He was set up for radiofrequency catheter ablation.  This was done on 6/20.  The patient was anticoagulated with Pradaxa.  He is maintaining sinus rhythm today.  He never got Pradaxa filled after his procedure.  There was some problem at the pharmacy where they would not fill it.  He denies any stroke symptoms.  He denies chest pain, shortness of breath, syncope.  He denies any palpitations.  Past Medical History  Diagnosis Date  . Hemorrhoids   . Guillain-Barre 2003  . Peptic ulcer 1964    with bleed ; transfused  . Osteoarthritis   . Left axis deviation 1999  . Dyslipidemia   . Family history of premature coronary artery disease   . Hearing loss, neural     Left ear  . Injury of tendon of long head of biceps     Current Outpatient Prescriptions  Medication Sig Dispense Refill  . acetaminophen (TYLENOL) 500 MG tablet Take 500 mg by mouth as needed. As needed for joint pain       . aspirin 325 MG tablet Take 325 mg by mouth daily.        Marland Kitchen esomeprazole (NEXIUM) 40 MG capsule Take 40 mg by mouth daily as needed.       Marland Kitchen lisinopril (PRINIVIL,ZESTRIL) 5 MG tablet Take 5 mg by mouth daily.        . Omega-3 Fatty Acids (FISH OIL) 500 MG CAPS Take 500 mg by mouth once.        . orlistat (ALLI) 60 MG capsule Take 60 mg by mouth daily as needed.       . pravastatin (PRAVACHOL) 40 MG tablet Take 20 mg by mouth daily.         Allergies: No Known Allergies  Vital Signs: BP 127/84  Pulse 81  Ht 6' (1.829 m)  Wt 240 lb (108.863 kg)  BMI 32.55 kg/m2  PHYSICAL EXAM: Well nourished, well developed, in no acute distress HEENT: normal Neck: no JVD Cardiac:  normal S1, S2;  RRR; no murmur Lungs:  clear to auscultation bilaterally, no wheezing, rhonchi or rales Abd: soft, nontender, no hepatomegaly Ext: no edema; Bilateral groins without hematoma or bruit Skin: warm and dry Neuro:  CNs 2-12 intact, no focal abnormalities noted  EKG:  Sinus rhythm, heart rate 79, left axis deviation, nonspecific ST-T wave changes  ASSESSMENT AND PLAN:

## 2011-05-16 NOTE — Assessment & Plan Note (Signed)
Maintaining sinus rhythm.  Unfortunately, he did not receive a refill on his Pradaxa.  In any event, he is a month out from his procedure.  He can continue on a baby aspirin for now.  He can follow up with Dr. Graciela Husbands PRN.  Long-term aspirin will be per his PCP depending on the risks/benefits.

## 2011-05-16 NOTE — Patient Instructions (Addendum)
You can change aspirin to 81 mg daily.  Follow up with Dr. Graciela Husbands as needed

## 2011-08-24 ENCOUNTER — Encounter: Payer: Self-pay | Admitting: Internal Medicine

## 2011-08-25 ENCOUNTER — Ambulatory Visit (INDEPENDENT_AMBULATORY_CARE_PROVIDER_SITE_OTHER): Payer: Medicare Other | Admitting: Internal Medicine

## 2011-08-25 ENCOUNTER — Encounter: Payer: Self-pay | Admitting: Internal Medicine

## 2011-08-25 DIAGNOSIS — E1159 Type 2 diabetes mellitus with other circulatory complications: Secondary | ICD-10-CM | POA: Insufficient documentation

## 2011-08-25 DIAGNOSIS — R7309 Other abnormal glucose: Secondary | ICD-10-CM

## 2011-08-25 DIAGNOSIS — E785 Hyperlipidemia, unspecified: Secondary | ICD-10-CM

## 2011-08-25 DIAGNOSIS — I10 Essential (primary) hypertension: Secondary | ICD-10-CM | POA: Insufficient documentation

## 2011-08-25 DIAGNOSIS — I4892 Unspecified atrial flutter: Secondary | ICD-10-CM

## 2011-08-25 LAB — BASIC METABOLIC PANEL
BUN: 12 mg/dL (ref 6–23)
Creatinine, Ser: 0.5 mg/dL (ref 0.4–1.5)
GFR: 171.62 mL/min (ref >60.00)
Glucose, Bld: 100 mg/dL — ABNORMAL HIGH (ref 70–99)

## 2011-08-25 LAB — HEMOGLOBIN A1C: Hgb A1c MFr Bld: 5.9 % (ref 4.6–6.5)

## 2011-08-25 LAB — LIPID PANEL
Cholesterol: 141 mg/dL (ref 0–200)
LDL Cholesterol: 85 mg/dL (ref 0–99)

## 2011-08-25 NOTE — Progress Notes (Signed)
  Subjective:    Patient ID: Troy Garrison, male    DOB: 10/07/1941, 70 y.o.   MRN: [id number]  HPI Pre op evaluation: Surgical diagnosis: R TKR  Tentative surgical date/Surgeon:Dr Gioffre; date tentatively this month Severity:pain up to 5 Pain: intermittent  Activity of daily living limitation/impairment of function:he can't bear weight on RLE climbing stairs Treatment to date, efficacy:NSAIDS as needed Past medical history reviewed ; see below.   Review of Systems HYPERTENSION: Disease Monitoring: Blood pressure range-< 135/85 on average  Chest pain, palpitation, irregular beats- no       Dyspnea- no Medications: Compliance- yes  Lightheadedness,Syncope- no    Edema- no  FASTING HYPERGLYCEMIA: FBS 122 in 5/11 Disease Monitoring: Blood Sugar ranges-not monitored  Polyuria/phagia/dipsia- no       Visual problems- no Medications: Compliance- no meds   HYPERLIPIDEMIA: mptoms for Hypertension Medications: Compliance- yes  Abd pain, bowel changes- no   Muscle aches- no         Objective:   Physical Exam Gen.: Healthy and well-nourished in appearance. Alert, appropriate and cooperative throughout exam. Eyes: No corneal or conjunctival inflammation noted. Ptosis bilaterally Nose: External nasal exam reveals no deformity or inflammation. Nasal mucosa are pink and moist. No lesions or exudates noted. Septum  normal  Mouth: Oral mucosa and oropharynx reveal no lesions or exudates. Teeth in good repair. Neck: No deformities, masses, or tenderness noted. Range of motion &. Thyroid normal. Lungs: Normal respiratory effort; chest expands symmetrically. Lungs are clear to auscultation without rales, wheezes, or increased work of breathing. Heart: Normal rate and rhythm. Normal S1 and S2. No gallop, click, or rub. Grade 1/2- 1  systolic  murmur. Abdomen: Bowel sounds normal; abdomen soft and nontender. No masses, organomegaly or hernias noted.                          Musculoskeletal/extremities: No deformity or scoliosis noted of  the thoracic or lumbar spine. No clubbing, cyanosis, edema, or deformity noted. Crepitus of knees with some decreased ROM .Tone & strength  normal. Nail health  good. Vascular: Carotid, radial artery, dorsalis pedis and  posterior tibial pulses are full and equal. No bruits present. Neurologic: Alert and oriented x3. Deep tendon reflexes symmetrical and normal.          Skin: Intact without suspicious lesions or rashes. Lymph: No cervical, axillary lymphadenopathy present. Psych: Mood and affect are normal. Normally interactive                                                                                         Assessment & Plan:  #1 degenerative joint disease right knee, end-stage. This is associated with mid-level pain but some immobilization especially climbing stairs.  #2 hypertension, well controlled  #3 dyslipidemia  #4 fasting hyperglycemia, past medical history  #5 atrial flutter, resolved post ablation  Plan: See orders; no medical contraindication to the planned surgery. 

## 2011-08-25 NOTE — Patient Instructions (Signed)
Your medically cleared if the pending labs all were at goal..Share results with Dr Darrelyn Hillock

## 2011-09-06 ENCOUNTER — Other Ambulatory Visit: Payer: Self-pay | Admitting: Orthopedic Surgery

## 2011-09-09 ENCOUNTER — Encounter (HOSPITAL_COMMUNITY): Payer: Self-pay | Admitting: Pharmacy Technician

## 2011-09-12 ENCOUNTER — Ambulatory Visit (HOSPITAL_COMMUNITY)
Admission: RE | Admit: 2011-09-12 | Discharge: 2011-09-12 | Disposition: A | Discharge status: Home / Self Care | Payer: Medicare Other | Source: Ambulatory Visit | Attending: Orthopedic Surgery | Admitting: Orthopedic Surgery

## 2011-09-12 ENCOUNTER — Encounter (HOSPITAL_COMMUNITY): Payer: Self-pay

## 2011-09-12 ENCOUNTER — Encounter (HOSPITAL_COMMUNITY)
Admission: RE | Admit: 2011-09-12 | Discharge: 2011-09-12 | Disposition: A | Discharge status: Home / Self Care | Payer: Medicare Other | Source: Ambulatory Visit | Attending: Orthopedic Surgery | Admitting: Orthopedic Surgery

## 2011-09-12 DIAGNOSIS — I10 Essential (primary) hypertension: Secondary | ICD-10-CM | POA: Insufficient documentation

## 2011-09-12 DIAGNOSIS — Z01818 Encounter for other preprocedural examination: Secondary | ICD-10-CM | POA: Insufficient documentation

## 2011-09-12 DIAGNOSIS — E785 Hyperlipidemia, unspecified: Secondary | ICD-10-CM | POA: Insufficient documentation

## 2011-09-12 DIAGNOSIS — Z01812 Encounter for preprocedural laboratory examination: Secondary | ICD-10-CM | POA: Insufficient documentation

## 2011-09-12 DIAGNOSIS — Z79899 Other long term (current) drug therapy: Secondary | ICD-10-CM | POA: Insufficient documentation

## 2011-09-12 DIAGNOSIS — M1710 Unilateral primary osteoarthritis, unspecified knee: Secondary | ICD-10-CM | POA: Insufficient documentation

## 2011-09-12 LAB — APTT: aPTT: 34 seconds (ref 24–37)

## 2011-09-12 LAB — DIFFERENTIAL
Basophils Relative: 0 % (ref 0–1)
Lymphocytes Relative: 31 % (ref 12–46)
Lymphs Abs: 2.2 10*3/uL (ref 0.7–4.0)
Monocytes Absolute: 0.5 10*3/uL (ref 0.1–1.0)
Monocytes Relative: 7 % (ref 3–12)
Neutro Abs: 4.1 10*3/uL (ref 1.7–7.7)

## 2011-09-12 LAB — CBC
MCHC: 34 g/dL (ref 30.0–36.0)
MCV: 90.4 fL (ref 78.0–100.0)
Platelets: 241 10*3/uL (ref 150–400)
RDW: 13.1 % (ref 11.5–15.5)
WBC: 7 10*3/uL (ref 4.0–10.5)

## 2011-09-12 LAB — URINALYSIS, ROUTINE W REFLEX MICROSCOPIC
Bilirubin Urine: NEGATIVE
Hgb urine dipstick: NEGATIVE
Ketones, ur: NEGATIVE mg/dL
Protein, ur: NEGATIVE mg/dL
Specific Gravity, Urine: 1.013 (ref 1.005–1.030)
Urobilinogen, UA: 0.2 mg/dL (ref 0.0–1.0)

## 2011-09-12 LAB — COMPREHENSIVE METABOLIC PANEL
AST: 23 U/L (ref 0–37)
Albumin: 3.9 g/dL (ref 3.5–5.2)
Chloride: 100 mEq/L (ref 96–112)
Creatinine, Ser: 0.75 mg/dL (ref 0.50–1.35)
Total Bilirubin: 0.2 mg/dL — ABNORMAL LOW (ref 0.3–1.2)
Total Protein: 6.7 g/dL (ref 6.0–8.3)

## 2011-09-12 LAB — PROTIME-INR: INR: 1.04 (ref 0.00–1.49)

## 2011-09-12 NOTE — Patient Instructions (Addendum)
20 MICHA DOSANJH  09/12/2011   Your procedure is scheduled on: 09/21/11  Report to [facility] at  5:30AM.  Call this number if you have problems the morning of surgery: [phone]   Remember:   Do not eat food:After Midnight.  Do not drink clear liquids: After Midnight.  Take these medicines the morning of surgery with A SIP OF WATER: NEXIUM   Do not wear jewelry, make-up or nail polish.  Do not wear lotions, powders, or perfumes. You may wear deodorant.  Do not shave 48 hours prior to surgery.  Do not bring valuables to the hospital.  Contacts, dentures or bridgework may not be worn into surgery.  Leave suitcase in the car. After surgery it may be brought to your room.  For patients admitted to the hospital, checkout time is 11:00 AM the day of discharge.   Patients discharged the day of surgery will not be allowed to drive home.  Name and phone number of your driver  Special Instructions: CHG Shower Use Special Wash: 1/2 bottle night before surgery and 1/2 bottle morning of surgery.   Please read over the following fact sheets that you were given: MRSA Information

## 2011-09-14 NOTE — H&P (Signed)
NAME:  Troy Garrison, Troy Garrison NO.:  [id number]  MEDICAL RECORD NO.:  [id number]  LOCATION:  XRAY                         FACILITY:  [facility]  PHYSICIAN:  Maisie Fus B. Hisae Decoursey, P.A.  DATE OF BIRTH:  08-07-1941  DATE OF ADMISSION:  09/12/2011 DATE OF DISCHARGE:  09/12/2011                             HISTORY & PHYSICAL   CHIEF COMPLAINT:  Right knee pain.  HISTORY OF PRESENT ILLNESS:  The patient is a 70 year old male with worsening right knee pain secondary to end-stage osteoarthritis.  The patient elected to have right total knee arthroplasty, decrease pain, and increase function by Dr. Ranee Gosselin.  PAST MEDICAL HISTORY: 1. Hypertension. 2. Hyperlipidemia. 3. Atrial flutter.  FAMILY MEDICAL HISTORY:  Congestive heart failure.  SOCIAL HISTORY:  The patient does not smoke or use alcohol.  Patient of Dr. Alwyn Ren.  DRUG ALLERGIES:  None.  MEDICATIONS: 1. Pravastatin 20 mg daily. 2. Lisinopril 5 mg daily. 3. Restasis 0.05% daily. 4. Alrex 0.2% each eye daily.  REVIEW OF SYSTEMS:  Pain with ambulation.  Otherwise review of systems is negative.  PHYSICAL EXAMINATION:  VITAL SIGNS:  Pulse 60, respirations 16, blood pressure 130/76. GENERAL:  The patient is a healthy appearing 70 year old male.  No acute distress.  Pleasant and affect, alert and oriented x3. HEAD AND NECK:  Cranial nerves II through XII grossly intact.  Neck shows full range of motion without tenderness. CHEST:  Active breath sounds bilaterally.  No wheezes, rhonchi, or rales. HEART:  Regular rate and rhythm.  No murmur. ABDOMEN:  Nontender, nondistended.  Active bowel sounds. EXTREMITIES:  Moderate tenderness to the right knee, especially medial joint line.  He does have a normal heel-toe gait.  No rashes or edema. Neurovascularly intact distally.  X-RAYS:  Show end-stage osteoarthritis, right knee.  IMPRESSION:  End-stage osteoarthritis, right knee.  PLAN OF ACTION:  Right total knee  arthroplasty by Dr. Ranee Gosselin.     Mirren Gest B. Ferne Coe.     TBD/MEDQ  D:  09/14/2011  T:  09/14/2011  Job:  [id number]

## 2011-09-21 ENCOUNTER — Inpatient Hospital Stay (HOSPITAL_COMMUNITY): Payer: Medicare Other

## 2011-09-21 ENCOUNTER — Inpatient Hospital Stay (HOSPITAL_COMMUNITY): Payer: Medicare Other | Admitting: Anesthesiology

## 2011-09-21 ENCOUNTER — Encounter (HOSPITAL_COMMUNITY): Payer: Self-pay

## 2011-09-21 ENCOUNTER — Encounter (HOSPITAL_COMMUNITY)
Admission: RE | Disposition: A | Discharge status: Home Health | Payer: Self-pay | Source: Ambulatory Visit | Attending: Orthopedic Surgery

## 2011-09-21 ENCOUNTER — Inpatient Hospital Stay (HOSPITAL_COMMUNITY)
Admission: RE | Admit: 2011-09-21 | Discharge: 2011-09-24 | DRG: 470 | Disposition: A | Discharge status: Home Health | Payer: Medicare Other | Source: Ambulatory Visit | Attending: Orthopedic Surgery | Admitting: Orthopedic Surgery

## 2011-09-21 ENCOUNTER — Encounter (HOSPITAL_COMMUNITY): Payer: Self-pay | Admitting: Anesthesiology

## 2011-09-21 DIAGNOSIS — I4892 Unspecified atrial flutter: Secondary | ICD-10-CM

## 2011-09-21 DIAGNOSIS — R351 Nocturia: Secondary | ICD-10-CM

## 2011-09-21 DIAGNOSIS — N400 Enlarged prostate without lower urinary tract symptoms: Secondary | ICD-10-CM

## 2011-09-21 DIAGNOSIS — I10 Essential (primary) hypertension: Secondary | ICD-10-CM | POA: Diagnosis present

## 2011-09-21 DIAGNOSIS — I452 Bifascicular block: Secondary | ICD-10-CM

## 2011-09-21 DIAGNOSIS — M1710 Unilateral primary osteoarthritis, unspecified knee: Principal | ICD-10-CM | POA: Diagnosis present

## 2011-09-21 DIAGNOSIS — Z85828 Personal history of other malignant neoplasm of skin: Secondary | ICD-10-CM

## 2011-09-21 DIAGNOSIS — Z96659 Presence of unspecified artificial knee joint: Secondary | ICD-10-CM

## 2011-09-21 DIAGNOSIS — Z9889 Other specified postprocedural states: Secondary | ICD-10-CM

## 2011-09-21 DIAGNOSIS — R195 Other fecal abnormalities: Secondary | ICD-10-CM

## 2011-09-21 DIAGNOSIS — K219 Gastro-esophageal reflux disease without esophagitis: Secondary | ICD-10-CM

## 2011-09-21 DIAGNOSIS — R7309 Other abnormal glucose: Secondary | ICD-10-CM

## 2011-09-21 DIAGNOSIS — G610 Guillain-Barre syndrome: Secondary | ICD-10-CM

## 2011-09-21 DIAGNOSIS — E785 Hyperlipidemia, unspecified: Secondary | ICD-10-CM

## 2011-09-21 DIAGNOSIS — R1031 Right lower quadrant pain: Secondary | ICD-10-CM

## 2011-09-21 DIAGNOSIS — M1990 Unspecified osteoarthritis, unspecified site: Secondary | ICD-10-CM

## 2011-09-21 HISTORY — PX: TOTAL KNEE ARTHROPLASTY: SHX125

## 2011-09-21 LAB — TYPE AND SCREEN

## 2011-09-21 SURGERY — ARTHROPLASTY, KNEE, TOTAL
Anesthesia: Spinal | Site: Knee | Laterality: Right | Wound class: Clean

## 2011-09-21 MED ORDER — HEPARIN SODIUM (PORCINE) 5000 UNIT/ML IJ SOLN
5000.0000 [IU] | Freq: Two times a day (BID) | INTRAMUSCULAR | Status: DC
  Administered 2011-09-21 – 2011-09-24 (×6): 5000 [IU] via SUBCUTANEOUS
  Filled 2011-09-21 (×7): qty 1

## 2011-09-21 MED ORDER — BISACODYL 10 MG RE SUPP: 10.0000 mg | Freq: Every day | RECTAL | Status: DC | PRN

## 2011-09-21 MED ORDER — HYDROCODONE-ACETAMINOPHEN 10-325 MG PO TABS
1.0000 | ORAL_TABLET | ORAL | Status: DC | PRN
  Administered 2011-09-22 – 2011-09-24 (×12): 1 via ORAL
  Filled 2011-09-21 (×11): qty 1

## 2011-09-21 MED ORDER — LACTATED RINGERS IV SOLN
INTRAVENOUS | Status: DC
  Administered 2011-09-21 – 2011-09-22 (×3): via INTRAVENOUS

## 2011-09-21 MED ORDER — LACTATED RINGERS IV SOLN: INTRAVENOUS | Status: DC

## 2011-09-21 MED ORDER — WARFARIN VIDEO: Freq: Once | Status: DC

## 2011-09-21 MED ORDER — POVIDONE-IODINE 7.5 % EX SOLN: Freq: Once | CUTANEOUS | Status: DC

## 2011-09-21 MED ORDER — ALUM & MAG HYDROXIDE-SIMETH 200-200-20 MG/5ML PO SUSP: 30.0000 mL | ORAL | Status: DC | PRN

## 2011-09-21 MED ORDER — MIDAZOLAM HCL 5 MG/5ML IJ SOLN
INTRAMUSCULAR | Status: DC | PRN
  Administered 2011-09-21: 2 mg via INTRAVENOUS

## 2011-09-21 MED ORDER — THROMBIN 5000 UNITS EX KIT
PACK | CUTANEOUS | Status: DC | PRN
  Administered 2011-09-21: 5000 [IU] via TOPICAL

## 2011-09-21 MED ORDER — SODIUM CHLORIDE 0.9 % IR SOLN
Status: DC | PRN
  Administered 2011-09-21: 07:00:00

## 2011-09-21 MED ORDER — METHOCARBAMOL 100 MG/ML IJ SOLN
500.0000 mg | Freq: Four times a day (QID) | INTRAVENOUS | Status: DC | PRN
  Administered 2011-09-21: 500 mg via INTRAVENOUS
  Filled 2011-09-21: qty 5

## 2011-09-21 MED ORDER — METHOCARBAMOL 100 MG/ML IJ SOLN
500.0000 mg | Freq: Four times a day (QID) | INTRAVENOUS | Status: DC | PRN
  Filled 2011-09-21: qty 5

## 2011-09-21 MED ORDER — DOCUSATE SODIUM 100 MG PO CAPS
100.0000 mg | ORAL_CAPSULE | Freq: Two times a day (BID) | ORAL | Status: DC
  Filled 2011-09-21: qty 1

## 2011-09-21 MED ORDER — ONDANSETRON HCL 4 MG/2ML IJ SOLN: 4.0000 mg | Freq: Four times a day (QID) | INTRAMUSCULAR | Status: DC | PRN

## 2011-09-21 MED ORDER — HYDROMORPHONE HCL PF 2 MG/ML IJ SOLN
0.5000 mg | INTRAMUSCULAR | Status: DC | PRN
  Administered 2011-09-21 (×2): 1 mg via INTRAVENOUS
  Administered 2011-09-21: 0.5 mg via INTRAVENOUS
  Administered 2011-09-22 (×2): 1 mg via INTRAVENOUS
  Filled 2011-09-21 (×5): qty 1

## 2011-09-21 MED ORDER — BUPIVACAINE IN DEXTROSE 0.75-8.25 % IT SOLN
INTRATHECAL | Status: DC | PRN
  Administered 2011-09-21: 15 mg via INTRATHECAL

## 2011-09-21 MED ORDER — LACTATED RINGERS IV SOLN
INTRAVENOUS | Status: DC | PRN
  Administered 2011-09-21 (×3): via INTRAVENOUS

## 2011-09-21 MED ORDER — METOCLOPRAMIDE HCL 5 MG/ML IJ SOLN: 5.0000 mg | Freq: Three times a day (TID) | INTRAMUSCULAR | Status: DC | PRN

## 2011-09-21 MED ORDER — METOCLOPRAMIDE HCL 10 MG PO TABS: 5.0000 mg | ORAL_TABLET | Freq: Three times a day (TID) | ORAL | Status: DC | PRN

## 2011-09-21 MED ORDER — HYDROMORPHONE HCL PF 1 MG/ML IJ SOLN
0.2500 mg | INTRAMUSCULAR | Status: DC | PRN
  Administered 2011-09-21 (×4): 0.5 mg via INTRAVENOUS

## 2011-09-21 MED ORDER — FERROUS SULFATE 325 (65 FE) MG PO TABS
325.0000 mg | ORAL_TABLET | Freq: Three times a day (TID) | ORAL | Status: DC
  Filled 2011-09-21: qty 1

## 2011-09-21 MED ORDER — FERROUS SULFATE 325 (65 FE) MG PO TABS
325.0000 mg | ORAL_TABLET | Freq: Three times a day (TID) | ORAL | Status: DC
  Administered 2011-09-22 – 2011-09-24 (×7): 325 mg via ORAL
  Filled 2011-09-21 (×9): qty 1

## 2011-09-21 MED ORDER — ACETAMINOPHEN 10 MG/ML IV SOLN
INTRAVENOUS | Status: DC | PRN
  Administered 2011-09-21: 1000 mg via INTRAVENOUS

## 2011-09-21 MED ORDER — BUPIVACAINE LIPOSOME 1.3 % IJ SUSP
20.0000 mL | Freq: Once | INTRAMUSCULAR | Status: AC
  Administered 2011-09-21: 20 mL
  Filled 2011-09-21: qty 20

## 2011-09-21 MED ORDER — BISACODYL 5 MG PO TBEC: 10.0000 mg | DELAYED_RELEASE_TABLET | Freq: Every day | ORAL | Status: DC | PRN

## 2011-09-21 MED ORDER — MENTHOL 3 MG MT LOZG: 1.0000 | LOZENGE | OROMUCOSAL | Status: DC | PRN

## 2011-09-21 MED ORDER — BACITRACIN ZINC 500 UNIT/GM EX OINT
TOPICAL_OINTMENT | CUTANEOUS | Status: DC | PRN
  Administered 2011-09-21: 1 via TOPICAL

## 2011-09-21 MED ORDER — CEFAZOLIN SODIUM-DEXTROSE 2-3 GM-% IV SOLR
2.0000 g | Freq: Once | INTRAVENOUS | Status: AC
  Administered 2011-09-21: 2 g via INTRAVENOUS

## 2011-09-21 MED ORDER — METHOCARBAMOL 500 MG PO TABS
500.0000 mg | ORAL_TABLET | Freq: Four times a day (QID) | ORAL | Status: DC | PRN
  Administered 2011-09-22 – 2011-09-23 (×5): 500 mg via ORAL
  Filled 2011-09-21 (×5): qty 1

## 2011-09-21 MED ORDER — ONDANSETRON HCL 4 MG PO TABS: 4.0000 mg | ORAL_TABLET | Freq: Four times a day (QID) | ORAL | Status: DC | PRN

## 2011-09-21 MED ORDER — METHOCARBAMOL 500 MG PO TABS: 500.0000 mg | ORAL_TABLET | Freq: Four times a day (QID) | ORAL | Status: DC | PRN

## 2011-09-21 MED ORDER — SODIUM CHLORIDE 0.9 % IR SOLN
Status: DC | PRN
  Administered 2011-09-21: 1000 mL

## 2011-09-21 MED ORDER — SIMVASTATIN 40 MG PO TABS
40.0000 mg | ORAL_TABLET | Freq: Every day | ORAL | Status: DC
  Administered 2011-09-22 – 2011-09-23 (×2): 40 mg via ORAL
  Filled 2011-09-21 (×3): qty 1

## 2011-09-21 MED ORDER — PHENOL 1.4 % MT LIQD: 1.0000 | OROMUCOSAL | Status: DC | PRN

## 2011-09-21 MED ORDER — FENTANYL CITRATE 0.05 MG/ML IJ SOLN
INTRAMUSCULAR | Status: DC | PRN
  Administered 2011-09-21 (×3): 50 ug via INTRAVENOUS

## 2011-09-21 MED ORDER — PHENYLEPHRINE HCL 10 MG/ML IJ SOLN
INTRAMUSCULAR | Status: DC | PRN
  Administered 2011-09-21 (×2): 120 ug via INTRAVENOUS

## 2011-09-21 MED ORDER — ACETAMINOPHEN 650 MG RE SUPP: 650.0000 mg | Freq: Four times a day (QID) | RECTAL | Status: DC | PRN

## 2011-09-21 MED ORDER — COUMADIN BOOK
Freq: Once | Status: AC
  Administered 2011-09-21: 20:00:00
  Filled 2011-09-21: qty 1

## 2011-09-21 MED ORDER — CEFAZOLIN SODIUM 1-5 GM-% IV SOLN
1.0000 g | Freq: Four times a day (QID) | INTRAVENOUS | Status: AC
  Administered 2011-09-21 – 2011-09-22 (×3): 1 g via INTRAVENOUS
  Filled 2011-09-21 (×5): qty 50

## 2011-09-21 MED ORDER — PANTOPRAZOLE SODIUM 40 MG PO TBEC
40.0000 mg | DELAYED_RELEASE_TABLET | Freq: Every day | ORAL | Status: DC
  Administered 2011-09-21 – 2011-09-24 (×4): 40 mg via ORAL
  Filled 2011-09-21 (×5): qty 1

## 2011-09-21 MED ORDER — PROPOFOL 10 MG/ML IV EMUL
INTRAVENOUS | Status: DC | PRN
  Administered 2011-09-21 (×2): 20 mL via INTRAVENOUS
  Administered 2011-09-21: 30 mL via INTRAVENOUS

## 2011-09-21 MED ORDER — LISINOPRIL 5 MG PO TABS
5.0000 mg | ORAL_TABLET | ORAL | Status: DC
Start: 2011-09-22 — End: 2011-09-24
  Administered 2011-09-22 – 2011-09-24 (×3): 5 mg via ORAL
  Filled 2011-09-21 (×4): qty 1

## 2011-09-21 MED ORDER — LACTATED RINGERS IV SOLN
INTRAVENOUS | Status: DC
  Administered 2011-09-21: 1000 mL via INTRAVENOUS

## 2011-09-21 MED ORDER — HETASTARCH-ELECTROLYTES 6 % IV SOLN
INTRAVENOUS | Status: DC | PRN
  Administered 2011-09-21: 09:00:00 via INTRAVENOUS

## 2011-09-21 MED ORDER — WARFARIN SODIUM 7.5 MG PO TABS
7.5000 mg | ORAL_TABLET | Freq: Once | ORAL | Status: AC
  Administered 2011-09-21: 7.5 mg via ORAL
  Filled 2011-09-21: qty 1

## 2011-09-21 MED ORDER — ONDANSETRON HCL 4 MG/2ML IJ SOLN
4.0000 mg | Freq: Four times a day (QID) | INTRAMUSCULAR | Status: DC | PRN
  Administered 2011-09-22: 4 mg via INTRAVENOUS
  Filled 2011-09-21: qty 2

## 2011-09-21 MED ORDER — DOCUSATE SODIUM 100 MG PO CAPS
100.0000 mg | ORAL_CAPSULE | Freq: Two times a day (BID) | ORAL | Status: DC
  Administered 2011-09-21 – 2011-09-24 (×3): 100 mg via ORAL
  Filled 2011-09-21 (×7): qty 1

## 2011-09-21 MED ORDER — ACETAMINOPHEN 325 MG PO TABS: 650.0000 mg | ORAL_TABLET | Freq: Four times a day (QID) | ORAL | Status: DC | PRN

## 2011-09-21 MED ORDER — AZITHROMYCIN 1 % OP SOLN
1.0000 [drp] | OPHTHALMIC | Status: DC
  Administered 2011-09-23: 1 [drp] via OPHTHALMIC
  Filled 2011-09-21 (×4): qty 1

## 2011-09-21 MED ORDER — PROPOFOL 10 MG/ML IV EMUL
INTRAVENOUS | Status: DC | PRN
  Administered 2011-09-21: 50 ug/kg/min via INTRAVENOUS

## 2011-09-21 MED ORDER — PROMETHAZINE HCL 25 MG/ML IJ SOLN
6.2500 mg | INTRAMUSCULAR | Status: DC | PRN
  Administered 2011-09-21: 6.25 mg via INTRAVENOUS

## 2011-09-21 SURGICAL SUPPLY — 62 items
BAG SPEC THK2 15X12 ZIP CLS (MISCELLANEOUS) ×1
BAG ZIPLOCK 12X15 (MISCELLANEOUS) ×2 IMPLANT
BANDAGE ELASTIC 4 VELCRO ST LF (GAUZE/BANDAGES/DRESSINGS) ×2 IMPLANT
BANDAGE ELASTIC 6 VELCRO ST LF (GAUZE/BANDAGES/DRESSINGS) ×2 IMPLANT
BANDAGE ESMARK 6X9 LF (GAUZE/BANDAGES/DRESSINGS) ×1 IMPLANT
BANDAGE GAUZE ELAST BULKY 4 IN (GAUZE/BANDAGES/DRESSINGS) ×2 IMPLANT
BLADE SAG 18X100X1.27 (BLADE) ×2 IMPLANT
BLADE SAW SGTL 13.0X1.19X90.0M (BLADE) ×2 IMPLANT
BNDG CMPR 9X6 STRL LF SNTH (GAUZE/BANDAGES/DRESSINGS) ×1
BNDG ESMARK 6X9 LF (GAUZE/BANDAGES/DRESSINGS) ×2
BONE CEMENT GENTAMICIN (Cement) ×4 IMPLANT
CEMENT BONE GENTAMICIN 40 (Cement) ×2 IMPLANT
CLOTH BEACON ORANGE TIMEOUT ST (SAFETY) ×2 IMPLANT
CUFF TOURN SGL QUICK 34 (TOURNIQUET CUFF) ×2
CUFF TRNQT CYL 34X4X40X1 (TOURNIQUET CUFF) ×1 IMPLANT
DRAPE EXTREMITY T 121X128X90 (DRAPE) ×2 IMPLANT
DRAPE INCISE IOBAN 66X45 STRL (DRAPES) ×2 IMPLANT
DRAPE POUCH INSTRU U-SHP 10X18 (DRAPES) ×2 IMPLANT
DRAPE U-SHAPE 47X51 STRL (DRAPES) ×2 IMPLANT
DRSG ADAPTIC 3X8 NADH LF (GAUZE/BANDAGES/DRESSINGS) ×2 IMPLANT
DRSG PAD ABDOMINAL 8X10 ST (GAUZE/BANDAGES/DRESSINGS) ×8 IMPLANT
DURAPREP 26ML APPLICATOR (WOUND CARE) ×2 IMPLANT
ELECT REM PT RETURN 9FT ADLT (ELECTROSURGICAL) ×2
ELECTRODE REM PT RTRN 9FT ADLT (ELECTROSURGICAL) ×1 IMPLANT
EVACUATOR 1/8 PVC DRAIN (DRAIN) ×2 IMPLANT
FACESHIELD LNG OPTICON STERILE (SAFETY) ×12 IMPLANT
FLOSEAL 10ML (HEMOSTASIS) ×1 IMPLANT
GLOVE BIOGEL PI IND STRL 8.5 (GLOVE) ×1 IMPLANT
GLOVE BIOGEL PI INDICATOR 8.5 (GLOVE) ×1
GLOVE ECLIPSE 8.0 STRL XLNG CF (GLOVE) ×2 IMPLANT
GOWN PREVENTION PLUS LG XLONG (DISPOSABLE) ×4 IMPLANT
GOWN STRL REIN XL XLG (GOWN DISPOSABLE) ×4 IMPLANT
HANDPIECE INTERPULSE COAX TIP (DISPOSABLE) ×2
IMMOBILIZER KNEE 20 (SOFTGOODS)
IMMOBILIZER KNEE 20 THIGH 36 (SOFTGOODS) IMPLANT
KIT BASIN OR (CUSTOM PROCEDURE TRAY) ×2 IMPLANT
MANIFOLD NEPTUNE II (INSTRUMENTS) ×2 IMPLANT
NDL SPNL 22GX7 QUINCKE BK (NEEDLE) IMPLANT
NEEDLE SPNL 22GX7 QUINCKE BK (NEEDLE) ×2 IMPLANT
NS IRRIG 1000ML POUR BTL (IV SOLUTION) ×2 IMPLANT
PACK TOTAL JOINT (CUSTOM PROCEDURE TRAY) ×2 IMPLANT
PAD ABD 7.5X8 STRL (GAUZE/BANDAGES/DRESSINGS) ×4 IMPLANT
POSITIONER SURGICAL ARM (MISCELLANEOUS) ×2 IMPLANT
SET HNDPC FAN SPRY TIP SCT (DISPOSABLE) ×1 IMPLANT
SET PAD KNEE POSITIONER (MISCELLANEOUS) ×2 IMPLANT
SPONGE LAP 18X18 X RAY DECT (DISPOSABLE) ×1 IMPLANT
SPONGE SURGIFOAM ABS GEL 100 (HEMOSTASIS) ×2 IMPLANT
STAPLER VISISTAT 35W (STAPLE) ×1 IMPLANT
SUCTION FRAZIER 12FR DISP (SUCTIONS) ×2 IMPLANT
SUT BONE WAX W31G (SUTURE) ×2 IMPLANT
SUT VIC AB 0 CT1 27 (SUTURE) ×4
SUT VIC AB 0 CT1 27XBRD ANTBC (SUTURE) ×2 IMPLANT
SUT VIC AB 1 CT1 27 (SUTURE) ×10
SUT VIC AB 1 CT1 27XBRD ANTBC (SUTURE) ×5 IMPLANT
SUT VIC AB 2-0 CT1 27 (SUTURE) ×6
SUT VIC AB 2-0 CT1 TAPERPNT 27 (SUTURE) ×3 IMPLANT
SYR 20CC LL (SYRINGE) ×1 IMPLANT
TOWEL OR 17X26 10 PK STRL BLUE (TOWEL DISPOSABLE) ×4 IMPLANT
TOWER CARTRIDGE SMART MIX (DISPOSABLE) ×2 IMPLANT
TRAY FOLEY CATH 14FRSI W/METER (CATHETERS) ×2 IMPLANT
WATER STERILE IRR 1500ML POUR (IV SOLUTION) ×2 IMPLANT
WRAP KNEE MAXI GEL POST OP (GAUZE/BANDAGES/DRESSINGS) ×4 IMPLANT

## 2011-09-21 NOTE — Preoperative (Signed)
Beta Blockers   Reason not to administer Beta Blockers:Not Applicable 

## 2011-09-21 NOTE — Op Note (Signed)
NAME:  DELMO, MATTY NO.:  [id number]  MEDICAL RECORD NO.:  [id number]  LOCATION:  [facility]                         FACILITY:  [facility]  PHYSICIAN:  Georges Lynch. Ky Moskowitz, M.D.DATE OF BIRTH:  02-22-1941  DATE OF PROCEDURE:  09/21/2011 DATE OF DISCHARGE:                              OPERATIVE REPORT   SURGEON:  Georges Lynch. Darrelyn Hillock, M.D.  ASSISTANT:  Marlowe Kays, M.D.  PREOPERATIVE DIAGNOSIS:  Severe degenerative arthritis with __________ bone on bone and a severe genu varus deformity of the right knee.  POSTOPERATIVE DIAGNOSIS:  Severe degenerative arthritis with __________ bone on bone and a severe genu varus deformity of the right knee.  OPERATION: 1. Synovectomy, right knee. 2. Right total knee arthroplasty utilizing the DePuy system.  We used     a 41 mm patella with 3 pegs, size 4 right femur, femoral component,     a size 5 tibial tray with a size four 12.5 mm thickness.  There was     a rotating platform.  PROCEDURE:  Under spinal anesthesia, routine orthopedic prep and draping of the right lower extremity carried out.  The appropriate time-out was carried out first before the incisions.  Also, in the holding area, I marked the appropriate right leg.  At this time, the leg was exsanguinated with Esmarch, tourniquet was elevated to 350 mmHg.  The leg was placed in the [facility] leg holder.  The knee was flexed.  An incision was made over the anterior aspect, left knee.  Bleeders identified and cauterized.  At this time, Dr. Simonne Come assisted in the entire procedure with cauterization and retraction.  We then carried out a medial parapatellar incision, reflected the patella laterally.  I then did medial and lateral meniscectomies.  Dr. Simonne Come assisted in the meniscectomies as well, and also assisted in the removal of the multiple exostoses.  Following that, we then made our initial drill hole in the intercondylar notch and removed 12 mm thickness off the  distal femur. It was extremely tight, extremely deformed.  At this time, we then measured our femur to be a size 4.  We then utilized our guide and carried out our chamfering cuts and the appropriate cuts for the distal femur for a size 4 right femur.  After this, we then prepared the tibia. We measured tibial plateau to be a size 5.  We removed approximately 6 mm thickness off the affected medial side.  We had a severe deformity here as well.  I did a nice soft tissue release prior to making the cuts.  Great care was taken at all times to protect the soft tissue structures.  We first made a drill hole in the tibial plateau, then inserted the nail finder.  We thoroughly irrigated out both canals obviously during this procedure.  We then removed our 6 mm thickness off the appropriate side after the appropriate lining guide was used.  After the tibia was prepared, we then went through our tension blocks and selected a 12.5 mm thickness prosthesis, this with the tibial plateau. At this time, we then completed our cuts.  We made our keel cut in the usual fashion in the tibia, made  our notch cut in the distal femur.  We then inserted our trial components, and an excellent fit with the 12.5 mm thickness insert.  While the trials were inserted, we then cut our patella in the usual fashion.  We did a resurfacing procedure on the patella.  Three drill holes were made in the patella.  Patella was measured to be a size 41 mm.  We then removed all trial components, thoroughly water picked out the knee, cemented all 3 components in simultaneously.  After the cement was dried, we then removed all loose pieces of cement.  We searched posteriorly and all about for the loose pieces of cement, then we thoroughly water picked the knee out again to make sure there were no loose fragments present.  After this, we then removed our trial tibial component and injected 10 cc of FloSeal.  I also injected  approximately 40 cc of Exparel.  We used 20 cc of Exparel mixed with 30 cc of saline.  Following that, I went ahead, and following that I then checked for bleeding again.  I closed the capsule over the Hemovac drain, let the tourniquet down, searched for bleeders.  We had good hemostasis.  I then closed the remaining wound in the usual fashion.  Skin was closed with metal staples.  Sterile Neosporin dressing was applied.  Note, we did utilize as I mentioned, a size four 12.5 mm thickness rotating platform insert.  During the entire procedure, Dr. Simonne Come assisted in holding the prosthesis in the appropriate alignment while the cement was drying, also cement removal, as well as retraction and hemostasis.  The patient did have 1 g of IV Ancef preop.          ______________________________ Georges Lynch. Darrelyn Hillock, M.D.     RAG/MEDQ  D:  09/21/2011  T:  09/21/2011  Job:  [id number]  cc:   Titus Dubin. Alwyn Ren, MD,FACP,FCCP [phone] [address] [place], [place] [address]

## 2011-09-21 NOTE — H&P (View-Only) (Signed)
  Subjective:    Patient ID: Troy Garrison, male    DOB: 1941-08-21, 70 y.o.   MRN: [id number]  HPI Pre op evaluation: Surgical diagnosis: R TKR  Tentative surgical date/Surgeon:Dr Gioffre; date tentatively this month Severity:pain up to 5 Pain: intermittent  Activity of daily living limitation/impairment of function:he can't bear weight on RLE climbing stairs Treatment to date, efficacy:NSAIDS as needed Past medical history reviewed ; see below.   Review of Systems HYPERTENSION: Disease Monitoring: Blood pressure range-< 135/85 on average  Chest pain, palpitation, irregular beats- no       Dyspnea- no Medications: Compliance- yes  Lightheadedness,Syncope- no    Edema- no  FASTING HYPERGLYCEMIA: FBS 122 in 5/11 Disease Monitoring: Blood Sugar ranges-not monitored  Polyuria/phagia/dipsia- no       Visual problems- no Medications: Compliance- no meds   HYPERLIPIDEMIA: mptoms for Hypertension Medications: Compliance- yes  Abd pain, bowel changes- no   Muscle aches- no         Objective:   Physical Exam Gen.: Healthy and well-nourished in appearance. Alert, appropriate and cooperative throughout exam. Eyes: No corneal or conjunctival inflammation noted. Ptosis bilaterally Nose: External nasal exam reveals no deformity or inflammation. Nasal mucosa are pink and moist. No lesions or exudates noted. Septum  normal  Mouth: Oral mucosa and oropharynx reveal no lesions or exudates. Teeth in good repair. Neck: No deformities, masses, or tenderness noted. Range of motion &. Thyroid normal. Lungs: Normal respiratory effort; chest expands symmetrically. Lungs are clear to auscultation without rales, wheezes, or increased work of breathing. Heart: Normal rate and rhythm. Normal S1 and S2. No gallop, click, or rub. Grade 1/2- 1  systolic  murmur. Abdomen: Bowel sounds normal; abdomen soft and nontender. No masses, organomegaly or hernias noted.                          Musculoskeletal/extremities: No deformity or scoliosis noted of  the thoracic or lumbar spine. No clubbing, cyanosis, edema, or deformity noted. Crepitus of knees with some decreased ROM .Tone & strength  normal. Nail health  good. Vascular: Carotid, radial artery, dorsalis pedis and  posterior tibial pulses are full and equal. No bruits present. Neurologic: Alert and oriented x3. Deep tendon reflexes symmetrical and normal.          Skin: Intact without suspicious lesions or rashes. Lymph: No cervical, axillary lymphadenopathy present. Psych: Mood and affect are normal. Normally interactive                                                                                         Assessment & Plan:  #1 degenerative joint disease right knee, end-stage. This is associated with mid-level pain but some immobilization especially climbing stairs.  #2 hypertension, well controlled  #3 dyslipidemia  #4 fasting hyperglycemia, past medical history  #5 atrial flutter, resolved post ablation  Plan: See orders; no medical contraindication to the planned surgery.

## 2011-09-21 NOTE — Transfer of Care (Addendum)
Immediate Anesthesia Transfer of Care Note  Patient: Troy Garrison  Procedure(s) Performed:  TOTAL KNEE ARTHROPLASTY  Patient Location: PACU  Anesthesia Type: Spinal  Level of Consciousness: awake, alert , oriented and responds to stimulation  Airway & Oxygen Therapy: Patient Spontanous Breathing and Patient connected to face mask oxygen  Post-op Assessment: Report given to PACU RN and Post -op Vital signs reviewed and stable  Post vital signs: stable  Complications: No apparent anesthesia complications

## 2011-09-21 NOTE — Interval H&P Note (Signed)
History and Physical Interval Note:   09/21/2011   7:34 AM   Troy Garrison  has presented today for surgery, with the diagnosis of Osteoarthritis of the Right Knee  The various methods of treatment have been discussed with the patient and family. After consideration of risks, benefits and other options for treatment, the patient has consented to  Procedure(s): TOTAL KNEE ARTHROPLASTY as a surgical intervention .  The patients' history has been reviewed, patient examined, no change in status, stable for surgery.  I have reviewed the patients' chart and labs.  Questions were answered to the patient's satisfaction.     Jacki Cones  MD

## 2011-09-21 NOTE — Anesthesia Procedure Notes (Addendum)
Spinal  Patient location during procedure: OR Start time: 09/21/2011 7:49 AM End time: 09/21/2011 8:00 AM Staffing Anesthesiologist: Ronelle Nigh L Performed by: anesthesiologist  Preanesthetic Checklist Completed: patient identified, site marked, surgical consent, pre-op evaluation, timeout performed, IV checked, risks and benefits discussed and monitors and equipment checked Spinal Block Patient position: sitting Prep: Betadine Patient monitoring: cardiac monitor, continuous pulse ox and blood pressure Approach: right paramedian Location: L2-3 Injection technique: single-shot Needle Needle gauge: 22 G Needle length: 5 cm Assessment Sensory level: T6  Performed by: Illene Silver

## 2011-09-21 NOTE — Brief Op Note (Signed)
09/21/2011  10:42 AM  PATIENT:  Troy Garrison  70 y.o. male  PRE-OPERATIVE DIAGNOSIS:  Osteoarthritis of the Right Knee  POST-OPERATIVE DIAGNOSIS:  Osteoarthritis of the Right Knee  PROCEDURE:  Procedure(s): TOTAL KNEE ARTHROPLASTY  SURGEON:  Surgeon(s): Windy Fast A Deontrey Massi James P Aplington  PHYSICIAN ASSISTANT:   ASSISTANTS: Dr.James Aplington   ANESTHESIA:   spinal  EBL:  Total I/O In: 2500 [I.V.:2000; IV Piggyback:500] Out: 475 [Urine:375; Blood:100]  BLOOD ADMINISTERED:none  DRAINS: (one) Hemovact drain(s) in the Right Knee with  Suction Open   LOCAL MEDICATIONS USED:  MARCAINE 20CCExaprel with 30cc of Saline  SPECIMEN:  No Specimen  DISPOSITION OF SPECIMEN:  N/A  COUNTS:  YES  TOURNIQUET:   Total Tourniquet Time Documented: Thigh (Right) - 105 minutes  DICTATION: .Other Dictation: Dictation Number [id number]  PLAN OF CARE: Admit to inpatient   PATIENT DISPOSITION:  PACU - hemodynamically stable.

## 2011-09-21 NOTE — Anesthesia Preprocedure Evaluation (Addendum)
Anesthesia Evaluation  Patient identified by MRN, date of birth, ID band Patient awake    Reviewed: Allergy & Precautions, H&P , NPO status , Patient's Chart, lab work & pertinent test results  Airway Mallampati: II TM Distance: >3 FB Neck ROM: full    Dental  (+) Teeth Intact, Caps and Dental Advisory Given,    Pulmonary neg pulmonary ROS,  clear to auscultation  Pulmonary exam normal       Cardiovascular Exercise Tolerance: Good hypertension, Pt. on medications neg cardio ROS + dysrhythmias Atrial Fibrillation regular Normal A flutter resolved with ablation   Neuro/Psych History Guillain-Barre-resolved  Neuromuscular disease Negative Neurological ROS  Negative Psych ROS   GI/Hepatic negative GI ROS, Neg liver ROS, PUD, GERD-  Medicated and Controlled,  Endo/Other  Negative Endocrine ROS  Renal/GU negative Renal ROS  Genitourinary negative   Musculoskeletal   Abdominal   Peds  Hematology negative hematology ROS (+)   Anesthesia Other Findings   Reproductive/Obstetrics negative OB ROS                         Anesthesia Physical Anesthesia Plan  ASA: III  Anesthesia Plan: Spinal   Post-op Pain Management:    Induction:   Airway Management Planned: Simple Face Mask  Additional Equipment:   Intra-op Plan:   Post-operative Plan:   Informed Consent: I have reviewed the patients History and Physical, chart, labs and discussed the procedure including the risks, benefits and alternatives for the proposed anesthesia with the patient or authorized representative who has indicated his/her understanding and acceptance.   Dental Advisory Given  Plan Discussed with: CRNA and Surgeon  Anesthesia Plan Comments:        Anesthesia Quick Evaluation

## 2011-09-21 NOTE — Progress Notes (Signed)
ANTICOAGULATION CONSULT NOTE - Initial Consult  Pharmacy Consult for Coumadin  Indication: VTE prophylaxis  No Known Allergies  Vital Signs: Temp: 97.9 F (36.6 C) (11/28 1730) Temp src: Oral (11/28 1730) BP: 153/87 mmHg (11/28 1730) Pulse Rate: 74  (11/28 1730)  Labs: No results found for this basename: HGB:2,HCT:3,PLT:3,APTT:3,LABPROT:3,INR:3,HEPARINUNFRC:3,CREATININE:3,CKTOTAL:3,CKMB:3,TROPONINI:3 in the last 72 hours The CrCl is unknown because both a height and weight (above a minimum accepted value) are required for this calculation.  Medical History: Past Medical History  Diagnosis Date  . Hemorrhoids   . Guillain-Barre 2003  . Peptic ulcer 1964    with bleed ; transfused  . Osteoarthritis   . Left axis deviation 1999  . Dyslipidemia   . Family history of premature coronary artery disease   . Hearing loss, neural     Left ear  . Injury of tendon of long head of biceps   . Atrial flutter     s/p RFCA with Dr. Graciela Husbands 6/12  . GERD (gastroesophageal reflux disease)   . Hyperlipidemia   . Hyperglycemia   . Cancer     SKIN; BASAL & SQUAMOUS CELL  . Guillain-Barre     2003-no residual problems    Medications:  Prescriptions prior to admission  Medication Sig Dispense Refill  . aspirin 325 MG tablet Take 325 mg by mouth every morning.       Marland Kitchen azithromycin (AZASITE) 1 % ophthalmic solution Place 1 drop into both eyes 1 day or 1 dose.        . diphenhydramine-acetaminophen (TYLENOL PM) 25-500 MG TABS Take 1 tablet by mouth at bedtime as needed. sleep      . esomeprazole (NEXIUM) 40 MG capsule Take 40 mg by mouth daily as needed. Heart burn      . ibuprofen (ADVIL,MOTRIN) 200 MG tablet Take 400 mg by mouth every 6 (six) hours as needed. Pain       . lisinopril (PRINIVIL,ZESTRIL) 5 MG tablet Take 5 mg by mouth every morning.       . Melatonin 5 MG TABS Take 1 tablet by mouth at bedtime as needed. Sleep       . Multiple Vitamin (MULTIVITAMIN) tablet Take 1 tablet by  mouth daily.        . Omega-3 Fatty Acids (FISH OIL) 500 MG CAPS Take 500 mg by mouth once.        . orlistat (ALLI) 60 MG capsule Take 60 mg by mouth daily as needed. Take when going to eat fried food      . oxymetazoline (AFRIN) 0.05 % nasal spray Place 2 sprays into the nose 2 (two) times daily as needed. Allergies       . pravastatin (PRAVACHOL) 40 MG tablet Take 20 mg by mouth at bedtime.        Scheduled:    . bupivacaine liposome  20 mL Infiltration Once  . ceFAZolin (ANCEF) IV  1 g Intravenous Q6H  . ceFAZolin (ANCEF) IV  2 g Intravenous Once  . docusate sodium  100 mg Oral BID  . ferrous sulfate  325 mg Oral TID PC  . DISCONTD: povidone-iodine   Topical Once    Assessment: 70 yo WM s/p R TKA  Goal of Therapy:  INR 2-3   Plan:  Coumadin 7.5 mg tonight at 8p. Daily protimes. Coumadin video/booklet for education  Otho Bellows 09/21/2011,6:08 PM

## 2011-09-21 NOTE — Anesthesia Postprocedure Evaluation (Signed)
  Anesthesia Post-op Note  Patient: Troy Garrison  Procedure(s) Performed:  TOTAL KNEE ARTHROPLASTY  Patient Location: PACU  Anesthesia Type: Spinal  Level of Consciousness: awake and alert   Airway and Oxygen Therapy: Patient Spontanous Breathing  Post-op Pain: mild  Post-op Assessment: Post-op Vital signs reviewed, Patient's Cardiovascular Status Stable, Respiratory Function Stable, Patent Airway and No signs of Nausea or vomiting  Post-op Vital Signs: stable  Complications: No apparent anesthesia complications

## 2011-09-22 LAB — BASIC METABOLIC PANEL
GFR calc Af Amer: >90 mL/min (ref >90)
GFR calc non Af Amer: 88 mL/min — ABNORMAL LOW (ref >90)
Potassium: 4.2 mEq/L (ref 3.5–5.1)
Sodium: 132 mEq/L — ABNORMAL LOW (ref 135–145)

## 2011-09-22 LAB — CBC
MCHC: 33 g/dL (ref 30.0–36.0)
Platelets: 201 10*3/uL (ref 150–400)
RDW: 13.1 % (ref 11.5–15.5)

## 2011-09-22 LAB — PROTIME-INR
INR: 1.24 (ref 0.00–1.49)
Prothrombin Time: 15.9 seconds — ABNORMAL HIGH (ref 11.6–15.2)

## 2011-09-22 MED ORDER — WARFARIN SODIUM 7.5 MG PO TABS
7.5000 mg | ORAL_TABLET | Freq: Once | ORAL | Status: AC
  Administered 2011-09-22: 7.5 mg via ORAL
  Filled 2011-09-22: qty 1

## 2011-09-22 NOTE — Progress Notes (Signed)
Met with patient and wife at bedside to discuss d/c plans. Patient reports that he plans to d/c home with his wife and HH/DME as needed- he will need a Rolling walker but states he has a shower with built in seat and handicapped toilet.  He plans to do [facility] therapies for a few weeks and then transition to Outpatient setting. RNCM updated-  Reece Levy, MSW, [place] [phone]

## 2011-09-22 NOTE — Progress Notes (Signed)
Physical Therapy Evaluation Patient Details Name: Troy Garrison MRN: [id number] DOB: 11-12-1940 Today's Date: 09/22/2011 1025-1055 Ev2 Problem List:  Patient Active Problem List  Diagnoses  . HYPERLIPIDEMIA  . GUILLAIN-BARRE SYNDROME  . GERD  . HYPERPLASIA PROSTATE UNS W/O UR OBST & OTH LUTS  . OSTEOARTHRITIS  . NOCTURIA  . RLQ PAIN  . HYPERGLYCEMIA, FASTING  . FECAL OCCULT BLOOD  . SKIN CANCER, HX OF  . TOTAL KNEE REPLACEMENT, HX OF  . Atrial flutter  . Bundle branch block, right and left anterior fascicular  . HTN (hypertension)    Past Medical History:  Past Medical History  Diagnosis Date  . Hemorrhoids   . Guillain-Barre 2003  . Peptic ulcer 1964    with bleed ; transfused  . Osteoarthritis   . Left axis deviation 1999  . Dyslipidemia   . Family history of premature coronary artery disease   . Hearing loss, neural     Left ear  . Injury of tendon of long head of biceps   . Atrial flutter     s/p RFCA with Dr. Graciela Husbands 6/12  . GERD (gastroesophageal reflux disease)   . Hyperlipidemia   . Hyperglycemia   . Cancer     SKIN; BASAL & SQUAMOUS CELL  . Guillain-Barre     2003-no residual problems   Past Surgical History:  Past Surgical History  Procedure Date  . Vasectomy     Scar tissue removed 2 years after the vasectomy  . Replacement total knee 2000    Left Knee  . Colonoscopy 2004, 2011    Internal hemorrhoids  . Ablation of dysrhythmic focus 2012     for AF;Dr Graciela Husbands    PT Assessment/Plan/Recommendation PT Assessment PT Recommendation/Assessment: Patient will need skilled PT in the acute care venue PT Problem List: Decreased strength;Decreased range of motion;Decreased activity tolerance;Decreased mobility;Decreased knowledge of use of DME;Decreased safety awareness;Decreased knowledge of precautions;Pain PT Therapy Diagnosis : Difficulty walking;Acute pain PT Plan PT Frequency: 7X/week PT Treatment/Interventions: DME instruction;Gait  training;Stair training;Functional mobility training;Patient/family education PT Recommendation Recommendations for Other Services: OT consult Follow Up Recommendations: Home health PT Equipment Recommended: Rolling walker with 5" wheels;3 in 1 bedside comode PT Goals  Acute Rehab PT Goals PT Goal Formulation: With patient/family Time For Goal Achievement: 7 days Pt will go Supine/Side to Sit: with supervision Pt will go Sit to Supine/Side: with supervision Pt will Transfer Sit to Stand/Stand to Sit: with supervision Pt will Ambulate: >150 feet;with supervision Pt will Go Up / Down Stairs: 1-2 stairs  PT Evaluation Precautions/Restrictions  Precautions Precautions: Knee Required Braces or Orthoses: Yes Restrictions Weight Bearing Restrictions: Yes RLE Weight Bearing: Weight bearing as tolerated Prior Functioning  Home Living Lives With: Spouse Receives Help From: Family Type of Home: House Home Layout: One level Home Access: Stairs to enter [employer] of Steps: 2 Prior Function Level of Independence: Independent with basic ADLs;Independent with gait Able to Take Stairs?: Yes Driving: Yes Vocation: Full time employment Cognition Cognition Arousal/Alertness: Awake/alert Overall Cognitive Status: Appears within functional limits for tasks assessed Orientation Level: Oriented X4 Sensation/Coordination Sensation Light Touch: Appears Intact Coordination Gross Motor Movements are Fluid and Coordinated: Yes Extremity Assessment RUE Assessment RUE Assessment: Within Functional Limits LUE Assessment LUE Assessment: Within Functional Limits RLE Assessment RLE Assessment: Exceptions to [facility] RLE AROM (degrees) RLE Overall AROM Comments: NT RLE Strength RLE Overall Strength Comments: Unable to SLR LLE Assessment LLE Assessment: Within Functional Limits Mobility (including Balance) Bed Mobility Bed Mobility:  Yes Supine to Sit: 4: Min assist;With  rails Transfers Transfers: Yes Sit to Stand: 4: Min assist;From chair/3-in-1;From bed;From elevated surface Sit to Stand Details (indicate cue type and reason): vc forhand placement Stand to Sit: 4: Min assist;To chair/3-in-1 Ambulation/Gait Ambulation/Gait: Yes Ambulation/Gait Assistance: 4: Min assist Ambulation Distance (Feet): 15 Feet (x2) Assistive device: Rolling walker Gait Pattern: Step-to pattern;Decreased stance time - right  Posture/Postural Control Posture/Postural Control: No significant limitations Exercise    End of Session PT - End of Session Equipment Utilized During Treatment: Right knee immobilizer Activity Tolerance: Patient tolerated treatment well Patient left: in chair;with call bell in reach;with family/visitor present General Behavior During Session: [facility] for tasks performed Cognition: [facility] for tasks performed  [name] 09/22/2011, 4:10 PM

## 2011-09-22 NOTE — Progress Notes (Signed)
Physical Therapy Treatment Patient Details Name: Troy Garrison MRN: [id number] DOB: 03-Mar-1941 Today's Date: 09/22/2011 7829-5621 eg PT Assessment/Plan  PT - Assessment/Plan Comments on Treatment Session: pt tolerated well, fatigued PT Plan: Discharge plan remains appropriate;Frequency remains appropriate PT Frequency: 7X/week Follow Up Recommendations: Home health PT Equipment Recommended: Rolling walker with 5" wheels;3 in 1 bedside comode PT Goals  Acute Rehab PT Goals PT Goal Formulation: With patient/family Time For Goal Achievement: 7 days Pt will go Supine/Side to Sit: with supervision PT Goal: Supine/Side to Sit - Progress: Progressing toward goal Pt will go Sit to Supine/Side: with supervision PT Goal: Sit to Supine/Side - Progress: Progressing toward goal Pt will Transfer Sit to Stand/Stand to Sit: with supervision PT Transfer Goal: Sit to Stand/Stand to Sit - Progress: Progressing toward goal Pt will Ambulate: >150 feet;with supervision PT Goal: Ambulate - Progress: Progressing toward goal Pt will Go Up / Down Stairs: 1-2 stairs  PT Treatment Precautions/Restrictions  Precautions Precautions: Knee Required Braces or Orthoses: Yes Restrictions Weight Bearing Restrictions: Yes RLE Weight Bearing: Weight bearing as tolerated Mobility (including Balance) Bed Mobility Bed Mobility: Yes Supine to Sit: 4: Min assist Sit to Supine - Right: 4: Min assist Transfers Transfers: Yes Sit to Stand: 4: Min assist;From chair/3-in-1, bed Sit to Stand Details (indicate cue type and reason): vc forhand placement Stand to Sit: 4: Min assist;To chair/3-in-1;To bed Ambulation/Gait Ambulation/Gait: Yes Ambulation/Gait Assistance: 4: Min assist Ambulation/Gait Assistance Details (indicate cue type and reason): vc to increase weight on RLE Ambulation Distance (Feet): 15 Feet ( then 30 feet) Assistive device: Rolling walker Gait Pattern: Step-to pattern;Decreased stance time -  right  Posture/Postural Control Posture/Postural Control: No significant limitations Exercise   TKA exercises x10 reps of SLR,QS, heel slides, abduction  aaRom End of Session PT - End of Session Equipment Utilized During Treatment: Right knee immobilizer Activity Tolerance: Patient tolerated treatment well Patient left: with call bell in reach;in bed General Behavior During Session: [facility] for tasks performed Cognition: [facility] for tasks performed  [name] 09/22/2011, 4:24 PM

## 2011-09-22 NOTE — Progress Notes (Signed)
ANTICOAGULATION CONSULT NOTE - Follow up Consult  Pharmacy Consult for Coumadin  Indication: VTE prophylaxis s/p TKA  No Known Allergies  Vital Signs: Temp: 97.8 F (36.6 C) (11/29 0516) Temp src: Oral (11/29 0516) BP: 128/71 mmHg (11/29 0516) Pulse Rate: 83  (11/29 0516)  Labs:  Basename 09/22/11 0436  HGB 11.0*  HCT 33.3*  PLT 201  APTT --  LABPROT 15.9*  INR 1.24  HEPARINUNFRC --  CREATININE 0.83  CKTOTAL --  CKMB --  TROPONINI --   Estimated Creatinine Clearance: 110.7 ml/min (by C-G formula based on Cr of 0.83).  Medical History: Past Medical History  Diagnosis Date  . Hemorrhoids   . Guillain-Barre 2003  . Peptic ulcer 1964    with bleed ; transfused  . Osteoarthritis   . Left axis deviation 1999  . Dyslipidemia   . Family history of premature coronary artery disease   . Hearing loss, neural     Left ear  . Injury of tendon of long head of biceps   . Atrial flutter     s/p RFCA with Dr. Graciela Husbands 6/12  . GERD (gastroesophageal reflux disease)   . Hyperlipidemia   . Hyperglycemia   . Cancer     SKIN; BASAL & SQUAMOUS CELL  . Guillain-Barre     2003-no residual problems    Medications:  Prescriptions prior to admission  Medication Sig Dispense Refill  . aspirin 325 MG tablet Take 325 mg by mouth every morning.       Marland Kitchen azithromycin (AZASITE) 1 % ophthalmic solution Place 1 drop into both eyes 1 day or 1 dose.        . diphenhydramine-acetaminophen (TYLENOL PM) 25-500 MG TABS Take 1 tablet by mouth at bedtime as needed. sleep      . esomeprazole (NEXIUM) 40 MG capsule Take 40 mg by mouth daily as needed. Heart burn      . ibuprofen (ADVIL,MOTRIN) 200 MG tablet Take 400 mg by mouth every 6 (six) hours as needed. Pain       . lisinopril (PRINIVIL,ZESTRIL) 5 MG tablet Take 5 mg by mouth every morning.       . Melatonin 5 MG TABS Take 1 tablet by mouth at bedtime as needed. Sleep       . Multiple Vitamin (MULTIVITAMIN) tablet Take 1 tablet by mouth daily.         . Omega-3 Fatty Acids (FISH OIL) 500 MG CAPS Take 500 mg by mouth once.        . orlistat (ALLI) 60 MG capsule Take 60 mg by mouth daily as needed. Take when going to eat fried food      . oxymetazoline (AFRIN) 0.05 % nasal spray Place 2 sprays into the nose 2 (two) times daily as needed. Allergies       . pravastatin (PRAVACHOL) 40 MG tablet Take 20 mg by mouth at bedtime.        Scheduled:     . azithromycin  1 drop Both Eyes 1 day or 1 dose  . ceFAZolin (ANCEF) IV  1 g Intravenous Q6H  . coumadin book   Does not apply Once  . docusate sodium  100 mg Oral BID  . ferrous sulfate  325 mg Oral TID PC  . heparin  5,000 Units Subcutaneous Q12H  . lisinopril  5 mg Oral Q0700  . pantoprazole  40 mg Oral Daily  . simvastatin  40 mg Oral q1800  . warfarin  7.5 mg  Oral Once  . warfarin   Does not apply Once  . DISCONTD: docusate sodium  100 mg Oral BID  . DISCONTD: ferrous sulfate  325 mg Oral TID PC  . DISCONTD: povidone-iodine   Topical Once   Anticoagulation: Heparin 5000 units SQ 11/28 -  Coumadin 7.5mg  on 11/28  Assessment: 70 yo WM s/p R TKA INR subtherapeutic as expected after only one dose of coumadin. Heparin bridge until INR therapeutic  Goal of Therapy:  INR 2-3   Plan:  Coumadin 7.5 mg PO today Daily PT/INR, CBC Coumadin education  Lynann Beaver PharmD  Pager [phone] 09/22/2011 12:23 PM

## 2011-09-22 NOTE — Progress Notes (Signed)
Subjective: Drain DCd and dressing changed. Wound looks fine.   Objective: Vital signs in last 24 hours: Temp:  [96.8 F (36 C)-98.2 F (36.8 C)] 97.8 F (36.6 C) (11/29 0516) Pulse Rate:  [57-83] 83  (11/29 0516) Resp:  [10-20] 20  (11/29 0516) BP: (108-154)/(42-87) 128/71 mmHg (11/29 0516) SpO2:  [96 %-100 %] 100 % (11/29 0516) Weight:  [113.399 kg (250 lb)] 250 lb (113.399 kg) (11/28 1730)  Intake/Output from previous day: 11/28 0701 - 11/29 0700 In: 5120 [P.O.:720; I.V.:3900; IV Piggyback:500] Out: 2480 [Urine:1200; Drains:1180; Blood:100] Intake/Output this shift:     Basename 09/22/11 0436  HGB 11.0*    Basename 09/22/11 0436  WBC 10.9*  RBC 3.63*  HCT 33.3*  PLT 201    Basename 09/22/11 0436  NA 132*  K 4.2  CL 98  CO2 27  BUN 13  CREATININE 0.83  GLUCOSE 161*  CALCIUM 8.4    Basename 09/22/11 0436  LABPT --  INR 1.24    Neurologically intact Intact pulses distally Dorsiflexion/Plantar flexion intact  Assessment/Plan: Plan on DC Sat.   Troy Garrison A 09/22/2011, 7:25 AM

## 2011-09-22 NOTE — Progress Notes (Signed)
Physical Therapy Treatment Patient Details Name: Troy Garrison MRN: [id number] DOB: [birth year] Today's Date: 09/22/2011 1215-1230 g PT Assessment/Plan  PT - Assessment/Plan Comments on Treatment Session: pt tolerated well, fatigued PT Plan: Discharge plan remains appropriate;Frequency remains appropriate PT Frequency: 7X/week Follow Up Recommendations: Home health PT Equipment Recommended: Rolling walker with 5" wheels;3 in 1 bedside comode PT Goals  Acute Rehab PT Goals PT Goal Formulation: With patient/family Time For Goal Achievement: 7 days Pt will go Supine/Side to Sit: with supervision PT Goal: Supine/Side to Sit - Progress: Progressing toward goal Pt will go Sit to Supine/Side: with supervision PT Goal: Sit to Supine/Side - Progress: Progressing toward goal Pt will Transfer Sit to Stand/Stand to Sit: with supervision PT Transfer Goal: Sit to Stand/Stand to Sit - Progress: Progressing toward goal Pt will Ambulate: >150 feet;with supervision PT Goal: Ambulate - Progress: Progressing toward goal Pt will Go Up / Down Stairs: 1-2 stairs  PT Treatment Precautions/Restrictions  Precautions Precautions: Knee Required Braces or Orthoses: Yes Restrictions Weight Bearing Restrictions: Yes RLE Weight Bearing: Weight bearing as tolerated Mobility (including Balance) Bed Mobility Bed Mobility: Yes Supine to Sit: 4: Min assist;With rails Sit to Supine - Right: 4: Min assist Transfers Transfers: Yes Sit to Stand: 4: Min assist;From chair/3-in-1 Sit to Stand Details (indicate cue type and reason): vc for hand placement Stand to Sit: 4: Min assist;To chair/3-in-1;To bed Ambulation/Gait Ambulation/Gait: Yes Ambulation/Gait Assistance: 4: Min assist Ambulation Distance (Feet): 5 Feet (to bed, pt fatigued) Assistive device: Rolling walker Gait Pattern: Step-to pattern;Decreased stance time - right  Posture/Postural Control Posture/Postural Control: No significant  limitations Exercise    End of Session PT - End of Session Equipment Utilized During Treatment: Right knee immobilizer Activity Tolerance: Patient tolerated treatment well Patient left: inbed;with call bell in reach; Behavior During Session: [facility] for tasks performed Cognition: [facility] for tasks performed  [name] 09/22/2011, 4:18 PM

## 2011-09-23 ENCOUNTER — Encounter (HOSPITAL_COMMUNITY): Payer: Self-pay | Admitting: Orthopedic Surgery

## 2011-09-23 LAB — BASIC METABOLIC PANEL
BUN: 8 mg/dL (ref 6–23)
CO2: 26 mEq/L (ref 19–32)
Chloride: 92 mEq/L — ABNORMAL LOW (ref 96–112)
Creatinine, Ser: 0.61 mg/dL (ref 0.50–1.35)

## 2011-09-23 LAB — CBC
HCT: 27.9 % — ABNORMAL LOW (ref 39.0–52.0)
MCV: 88.6 fL (ref 78.0–100.0)
RBC: 3.15 MIL/uL — ABNORMAL LOW (ref 4.22–5.81)
WBC: 9.1 10*3/uL (ref 4.0–10.5)

## 2011-09-23 LAB — PROTIME-INR: Prothrombin Time: 17.9 seconds — ABNORMAL HIGH (ref 11.6–15.2)

## 2011-09-23 MED ORDER — WARFARIN SODIUM 7.5 MG PO TABS
7.5000 mg | ORAL_TABLET | Freq: Once | ORAL | Status: AC
  Administered 2011-09-23: 7.5 mg via ORAL
  Filled 2011-09-23: qty 1

## 2011-09-23 MED ORDER — FERROUS SULFATE 325 (65 FE) MG PO TABS: 325.0000 mg | ORAL_TABLET | Freq: Three times a day (TID) | ORAL | Status: DC

## 2011-09-23 NOTE — Discharge Summary (Signed)
Physician Discharge Summary  Patient ID: Troy Garrison MRN: [id number] DOB/AGE: 1941/08/26 70 y.o.  Admit date: 09/21/2011 Discharge date: 09/23/2011  Admission Diagnoses:Osteoarthriris Knee  Discharge Diagnoses:Right Total Knee  Active Problems:  * No active hospital problems. *    Discharged Condition: good  Hospital Course: No complications  Consults: none  Significant Diagnostic Studies: radiology: X-Ray: Post-Op Knee xrays  Treatments: therapies: PT  Discharge Exam: Blood pressure 156/77, pulse 100, temperature 98.2 F (36.8 C), temperature source Oral, resp. rate 16, height 6' (1.829 m), weight 113.399 kg (250 lb), SpO2 97.00%. General appearance: alert and wound was fine.  Disposition: Home or Self Care    Follow-up Information    Follow up with Selah Zelman A in 2 weeks.   Contact information:   [facility] [address] [place] [address] [phone]          Signed: Jacki Cones 09/23/2011, 9:06 PM

## 2011-09-23 NOTE — Progress Notes (Addendum)
Physical Therapy Treatment Patient Details Name: Troy Garrison MRN: [id number] DOB: [birth year] Today's Date: 09/23/2011 9:50 - 10:30 1 gt  1 te  1ta PT Assessment/Plan  PT - Assessment/Plan Comments on Treatment Session: pt feeling better today PT Plan: Discharge plan remains appropriate PT Frequency: 7X/week Follow Up Recommendations: Home health PT Equipment Recommended: Rolling walker with 5" wheels;3 in 1 bedside comode PT Goals  Acute Rehab PT Goals PT Goal Formulation: With patient Pt will go Supine/Side to Sit: with supervision PT Goal: Supine/Side to Sit - Progress: Progressing toward goal Pt will go Sit to Supine/Side: with supervision PT Goal: Sit to Supine/Side - Progress: Progressing toward goal Pt will Transfer Sit to Stand/Stand to Sit: with supervision PT Transfer Goal: Sit to Stand/Stand to Sit - Progress: Progressing toward goal Pt will Ambulate: >150 feet;with supervision;with least restrictive assistive device PT Goal: Ambulate - Progress: Progressing toward goal Pt will Go Up / Down Stairs: 1-2 stairs PT Goal: Up/Down Stairs - Progress: Progressing toward goal  PT Treatment Precautions/Restrictions  Precautions Precautions: Knee Precaution Comments: KI until 10 active SLR Required Braces or Orthoses: Yes Restrictions Weight Bearing Restrictions: No RLE Weight Bearing: Weight bearing as tolerated Mobility (including Balance) Bed Mobility Bed Mobility: No Transfers Transfers: Yes Sit to Stand: 4: Min assist;From chair/3-in-1 Sit to Stand Details (indicate cue type and reason): increased time Stand to Sit: 4: Min assist;To chair/3-in-1 Stand to Sit Details: one VC for hand placement Ambulation/Gait Ambulation/Gait: Yes Ambulation/Gait Assistance: 4: Min assist Ambulation/Gait Assistance Details (indicate cue type and reason): 25% VC's on sequencing and proper walker to self distance Ambulation Distance (Feet): 75 Feet Assistive device: Rolling  walker Gait Pattern: Step-to pattern Stairs: No Wheelchair Mobility Wheelchair Mobility: No    Exercise  Total Joint Exercises Ankle Circles/Pumps: AROM;Both;10 reps Quad Sets: AROM;Both;10 reps Gluteal Sets: AROM;Both;10 reps Towel Squeeze: AROM;Both;10 reps Short Arc QuadBarbaraann Boys;Right;10 reps Heel Slides: AAROM;Right;10 reps (demonstarated and instructed pt how to use a bed sheet to he) Hip ABduction/ADduction: AAROM;Right;10 reps Straight Leg Raises: AAROM;Right;10 reps End of Session PT - End of Session Equipment Utilized During Treatment: Gait belt;Right knee immobilizer Activity Tolerance: Patient tolerated treatment well Patient left: in chair;with call bell in reach Nurse Communication: Mobility status for transfers Felecia Shelling PTA [facility]  Acute  Rehab Pager     [phone]

## 2011-09-23 NOTE — Progress Notes (Signed)
Physical Therapy Treatment Patient Details Name: DERWOOD BECRAFT MRN: [id number] DOB: [birth year] Today's Date: 09/23/2011 11:40 - 12;10 1 gt  1ta PT Assessment/Plan  PT - Assessment/Plan Comments on Treatment Session: pt plans to D/C to home today PT Plan: Discharge plan remains appropriate PT Frequency: 7X/week Follow Up Recommendations: Home health PT Equipment Recommended: None recommended by PT (pt has all equipment from prior TKR) PT Goals  Acute Rehab PT Goals PT Goal Formulation: With patient Pt will go Supine/Side to Sit: with supervision PT Goal: Supine/Side to Sit - Progress: Partly met Pt will go Sit to Supine/Side: with supervision PT Goal: Sit to Supine/Side - Progress: Partly met Pt will Transfer Sit to Stand/Stand to Sit: with supervision PT Transfer Goal: Sit to Stand/Stand to Sit - Progress: Partly met Pt will Ambulate: >150 feet;with supervision;with least restrictive assistive device PT Goal: Ambulate - Progress: Partly met Pt will Go Up / Down Stairs: 1-2 stairs;with min assist;with least restrictive assistive device PT Goal: Up/Down Stairs - Progress: Partly met  PT Treatment Precautions/Restrictions  Precautions Precautions: Knee Precaution Comments: demonstarted and instructed family on proper application of KI and when to D/C Required Braces or Orthoses: Yes Restrictions Weight Bearing Restrictions: No RLE Weight Bearing: Weight bearing as tolerated Mobility (including Balance) Bed Mobility Bed Mobility: Yes Supine to Sit: 4: Min assist Supine to Sit Details (indicate cue type and reason): HOB increased 45' and increased time Transfers Transfers: Yes Sit to Stand: 4: Min assist;From bed Sit to Stand Details (indicate cue type and reason): increased time Stand to Sit: 4: Min assist Stand to Sit Details: 75% VC's to reach back prior to sit, spouse states he tends to "plop" inhis chair Ambulation/Gait Ambulation/Gait: Yes Ambulation/Gait  Assistance: 4: Min assist Ambulation/Gait Assistance Details (indicate cue type and reason): 25% VC's on sequencing and proper walker to self distance Ambulation Distance (Feet): 15 Feet Assistive device: Standard walker Gait Pattern: Step-to pattern Stairs: Yes Stairs Assistance: 3: Mod assist Stairs Assistance Details (indicate cue type and reason): demonstarted and instructed family on proper technique, importance od KI use and to have 2 people as pt tends to lose balance' Stair Management Technique: No rails;Backwards;With walker Number of Stairs: 2  Wheelchair Mobility Wheelchair Mobility: No    Exercise  End of Session PT - End of Session Equipment Utilized During Treatment: Gait belt Activity Tolerance: Patient tolerated treatment well Patient left: in chair;with call bell in reach;with family/visitor present Nurse Communication:  (pt ready for D/C to home)  Felecia Shelling PTA [facility]  Acute  Rehab Pager     [phone]

## 2011-09-23 NOTE — Progress Notes (Signed)
ANTICOAGULATION CONSULT NOTE - Follow up Consult  Pharmacy Consult for Coumadin  Indication: VTE prophylaxis s/p TKA  No Known Allergies  Vital Signs: Temp: 97.8 F (36.6 C) (11/30 0600) Temp src: Oral (11/30 0600) BP: 147/71 mmHg (11/30 0600) Pulse Rate: 91  (11/30 0600)  Labs:  Basename 09/23/11 0643 09/23/11 0450 09/22/11 0436  HGB -- 9.8* 11.0*  HCT -- 27.9* 33.3*  PLT -- 159 201  APTT -- -- --  LABPROT 17.9* -- 15.9*  INR 1.45 -- 1.24  HEPARINUNFRC -- -- --  CREATININE -- 0.61 0.83  CKTOTAL -- -- --  CKMB -- -- --  TROPONINI -- -- --   Estimated Creatinine Clearance: 114.9 ml/min (by C-G formula based on Cr of 0.61).  Medical History: Past Medical History  Diagnosis Date  . Hemorrhoids   . Guillain-Barre 2003  . Peptic ulcer 1964    with bleed ; transfused  . Osteoarthritis   . Left axis deviation 1999  . Dyslipidemia   . Family history of premature coronary artery disease   . Hearing loss, neural     Left ear  . Injury of tendon of long head of biceps   . Atrial flutter     s/p RFCA with Dr. Graciela Husbands 6/12  . GERD (gastroesophageal reflux disease)   . Hyperlipidemia   . Hyperglycemia   . Cancer     SKIN; BASAL & SQUAMOUS CELL  . Guillain-Barre     2003-no residual problems    Medications: Scheduled:     . azithromycin  1 drop Both Eyes 1 day or 1 dose  . docusate sodium  100 mg Oral BID  . ferrous sulfate  325 mg Oral TID PC  . heparin  5,000 Units Subcutaneous Q12H  . lisinopril  5 mg Oral Q0700  . pantoprazole  40 mg Oral Daily  . simvastatin  40 mg Oral q1800  . warfarin  7.5 mg Oral ONCE-1800  . warfarin   Does not apply Once   Anticoagulation: Heparin 5000 units SQ 11/28 -  Coumadin 7.5mg  on 11/28, 11/29  Assessment: 70 yo WM s/p R TKA INR subtherapeutic, but increasing with coumadin initiation Heparin bridge until INR therapeutic Coumadin education completed  Goal of Therapy:  INR 2-3   Plan:  Coumadin 7.5 mg PO today Daily  PT/INR, CBC   Lynann Beaver PharmD  Pager [phone] 09/23/2011 9:22 AM

## 2011-09-23 NOTE — Progress Notes (Signed)
Orthopedic Tech Progress Note Patient Details:  Troy Garrison 11-Nov-1940 [id number]  CPM Right Knee CPM Right Knee: Off Right Knee Flexion (Degrees): 45  Right Knee Extension (Degrees): 0  Additional Comments: 4 hrs per day   [name] 09/23/2011, 1:47 PM Per discussion with patient, he declined use of CPM today. He said he overdid it the day before and discussed with Dr. Darrelyn Hillock that he didn't need it today. Pt is to be discharged following day.

## 2011-09-23 NOTE — Progress Notes (Signed)
Physical Therapy Treatment Patient Details Name: Troy Garrison MRN: [id number] DOB: [birth year] Today's Date: 09/23/2011 15;10 - `15:35 1 gt  1te PT Assessment/Plan  PT - Assessment/Plan Comments on Treatment Session: pt plans to D/C to home today PT Plan: Discharge plan remains appropriate Follow Up Recommendations: Home health PT Equipment Recommended: None recommended by PT (pt has all equipment from prior TKR) PT Goals  Acute Rehab PT Goals PT Goal Formulation: With patient Pt will go Supine/Side to Sit: with supervision PT Goal: Supine/Side to Sit - Progress: Partly met Pt will go Sit to Supine/Side: with supervision PT Goal: Sit to Supine/Side - Progress: Partly met Pt will Transfer Sit to Stand/Stand to Sit (DO NOT USE): with supervision PT Transfer Goal: Sit to Stand/Stand to Sit - Progress (DO NOT USE): Partly met Pt will Ambulate: >150 feet;with supervision;with least restrictive assistive device PT Goal: Ambulate - Progress: Partly met Pt will Go Up / Down Stairs: 1-2 stairs;with min assist;with least restrictive assistive device PT Goal: Up/Down Stairs - Progress: Partly met  PT Treatment Precautions/Restrictions  Precautions Precautions: Knee Precaution Comments: KI until 10 active SLR Required Braces or Orthoses: Yes Restrictions Weight Bearing Restrictions: No RLE Weight Bearing: Weight bearing as tolerated Mobility (including Balance) Bed Mobility Bed Mobility: Yes Supine to Sit: 5: Supervision Supine to Sit Details (indicate cue type and reason): only required increased time Sit to Supine - Right: 4: Min assist Sit to Supine - Right Details (indicate cue type and reason): min assist to support R LE up into bed Transfers Transfers: Yes Sit to Stand:  (minguard assist) Sit to Stand Details (indicate cue type and reason): increased time and one VC for hand placement Stand to Sit:  (MinGuard assist) Stand to Sit Details: one VC on hand placement priot to  sit Ambulation/Gait Ambulation/Gait: Yes Ambulation/Gait Assistance:  (MinGuard assist) Ambulation/Gait Assistance Details (indicate cue type and reason): <25% VC's to roll walker and proper distance for his step length Ambulation Distance (Feet): 95 Feet Assistive device: Rolling walker Gait Pattern: Step-to pattern Stairs: No Stairs Assistance Details (indicate cue type and reason): instructed pt he would practice steps tommorrow Number of Stairs: 0     Exercise  Using a bed sheet pt instructed on use with heel slides to assist with knee flexion and increase ROM 10 reps and instructed to perform as needed as pt does not tolerate CPM  End of Session  Assisted pt back to bed with call light in reach  Felecia Shelling PTA [facility]  Acute  Rehab Pager     [phone]

## 2011-09-23 NOTE — Progress Notes (Signed)
Subjective: Doing well this A.M. On CPM.   Objective: Vital signs in last 24 hours: Temp:  [97.6 F (36.4 C)-98.8 F (37.1 C)] 97.8 F (36.6 C) (11/30 0600) Pulse Rate:  [88-97] 91  (11/30 0600) Resp:  [12-18] 12  (11/30 0600) BP: (147-176)/(71-84) 147/71 mmHg (11/30 0600) SpO2:  [95 %-100 %] 100 % (11/30 0600)  Intake/Output from previous day: 11/29 0701 - 11/30 0700 In: 2373.3 [P.O.:480; I.V.:1893.3] Out: 251 [Urine:251] Intake/Output this shift: Total I/O In: 1191.7 [P.O.:480; I.V.:711.7] Out: -    Basename 09/23/11 0450 09/22/11 0436  HGB 9.8* 11.0*    Basename 09/23/11 0450 09/22/11 0436  WBC 9.1 10.9*  RBC 3.15* 3.63*  HCT 27.9* 33.3*  PLT 159 201    Basename 09/23/11 0450 09/22/11 0436  NA 124* 132*  K 3.6 4.2  CL 92* 98  CO2 26 27  BUN 8 13  CREATININE 0.61 0.83  GLUCOSE 127* 161*  CALCIUM 8.2* 8.4    Basename 09/22/11 0436  LABPT --  INR 1.24    Neurologically intact Neurovascular intact Dorsiflexion/Plantar flexion intact  Assessment/Plan: DC Plans for Sat.   Eran Mistry A 09/23/2011, 6:43 AM

## 2011-09-23 NOTE — Progress Notes (Signed)
CM spoke with patient and spouse. Current plans are for patient to go back to his home in [place] where spouse will be caregiver. Pt is requesting HH agency that is in network. He will need RW

## 2011-09-24 LAB — CBC
HCT: 24.8 % — ABNORMAL LOW (ref 39.0–52.0)
Hemoglobin: 8.5 g/dL — ABNORMAL LOW (ref 13.0–17.0)
RDW: 12.6 % (ref 11.5–15.5)
WBC: 7.6 10*3/uL (ref 4.0–10.5)

## 2011-09-24 LAB — PROTIME-INR: INR: 1.37 (ref 0.00–1.49)

## 2011-09-24 NOTE — Progress Notes (Signed)
Subjective: Dressing changed and wound looks fine   Objective: Vital signs in last 24 hours: Temp:  [98.1 F (36.7 C)-98.8 F (37.1 C)] 98.1 F (36.7 C) (12/01 0525) Pulse Rate:  [99-101] 101  (12/01 0525) Resp:  [16] 16  (12/01 0525) BP: (136-156)/(74-77) 136/76 mmHg (12/01 0525) SpO2:  [96 %-97 %] 96 % (12/01 0525)  Intake/Output from previous day: 11/30 0701 - 12/01 0700 In: 1530.3 [P.O.:1440; I.V.:90.3] Out: 2075 [Urine:2075] Intake/Output this shift:     Basename 09/24/11 0432 09/23/11 0450 09/22/11 0436  HGB 8.5* 9.8* 11.0*    Basename 09/24/11 0432 09/23/11 0450  WBC 7.6 9.1  RBC 2.81* 3.15*  HCT 24.8* 27.9*  PLT 187 159    Basename 09/23/11 0450 09/22/11 0436  NA 124* 132*  K 3.6 4.2  CL 92* 98  CO2 26 27  BUN 8 13  CREATININE 0.61 0.83  GLUCOSE 127* 161*  CALCIUM 8.2* 8.4    Basename 09/24/11 0432 09/23/11 0643  LABPT -- --  INR 1.37 1.45    Neurologically intact Intact pulses distally Dorsiflexion/Plantar flexion intact  Assessment/Plan: DC Today   Troy Garrison A 09/24/2011, 7:35 AM

## 2011-09-24 NOTE — Progress Notes (Signed)
Physical Therapy Treatment Pt ready for DC from our standpoint, all education complete Patient Details Name: Troy Garrison MRN: [id number] DOB: 19-Oct-1941 Today's Date: 09/24/2011 845-932 G,ta,ex  PT Assessment/Plan  PT - Assessment/Plan Comments on Treatment Session: pt plans to D/C to home today PT Plan: Discharge plan remains appropriate Follow Up Recommendations: Home health PT PT Goals  Acute Rehab PT Goals PT Goal Formulation: With patient PT Goal: Supine/Side to Sit - Progress: Met PT Goal: Sit to Supine/Side - Progress: Met PT Goal: Ambulate - Progress: Met PT Goal: Up/Down Stairs - Progress: Met  PT Treatment Precautions/Restrictions  Precautions Precautions: Knee Precaution Comments: KI until 10 active SLR Required Braces or Orthoses: Yes Restrictions Weight Bearing Restrictions: No RLE Weight Bearing: Weight bearing as tolerated Mobility (including Balance) Bed Mobility Bed Mobility: Yes Supine to Sit: 7: Independent Sit to Supine - Right: 7: Independent Transfers Transfers: Yes Sit to Stand: 7: Independent Stand to Sit: 7: Independent Ambulation/Gait Ambulation/Gait: Yes Ambulation/Gait Assistance: 5: Supervision Ambulation Distance (Feet):  (worked on exercises and steps today.. no distance walking du) Assistive device: Rolling walker Gait Pattern: Step-through pattern Stairs: Yes Stairs Assistance: 5: Supervision Stairs Assistance Details (indicate cue type and reason): up/down one step with RW cues for sequencing.Marland Kitchen learned nicely!! Number of Stairs: 1     Exercise  Total Joint Exercises Ankle Circles/Pumps: Right;AROM Quad Sets: 10 reps;AROM;Right Short Arc Quad: Right;10 reps;AAROM (AAROM to achieve TKE) Heel Slides: AROM;Right;10 reps Straight Leg Raises: AAROM;Right;10 reps Long Arc Quad: AAROM;Right;10 reps Knee Flexion: Right;AROM;5 reps;Seated End of Session PT - End of Session Activity Tolerance: Patient tolerated treatment  well Patient left: in bed Nurse Communication: Other (comment) (ready for DC from our standpoint, education completed)  Quadir Muns, [facility] 09/24/2011, 9:54 AM Marella Bile, PT Pager: [phone] 09/24/2011

## 2011-09-24 NOTE — Discharge Summary (Signed)
Physician Discharge Summary  Patient ID: Troy Garrison MRN: [id number] DOB/AGE: November 19, 1940 70 y.o.  Admit date: 09/21/2011 Discharge date: 09/27/2011  Admission Diagnoses:Osteoarthritis Discharge Diagnoses: Post Op Total Knee  Active Problems:  * No active hospital problems. *    Discharged Condition: good  Hospital Course: No Complications  Consults: none  Significant Diagnostic Studies: labs: Daily INR  and Posy-Op Knee Xrays  Treatments: antibiotics: Ancef and anticoagulation: warfarin  Discharge Exam: Blood pressure 136/76, pulse 101, temperature 98.1 F (36.7 C), temperature source Oral, resp. rate 16, height 6' (1.829 m), weight 113.399 kg (250 lb), SpO2 96.00%. Extremities: extremities normal, atraumatic, no cyanosis or edema and Homans sign is negative, no sign of DVT Incision/Wound: Wond looked fine  Disposition: Home-Health Care Svc  Discharge Orders    Future Orders Please Complete By Expires   Diet general      Diet general      Call MD / Call 911      Comments:   If you experience chest pain or shortness of breath, CALL 911 and be transported to the hospital emergency room.  If you develope a fever above 101 F, pus (white drainage) or increased drainage or redness at the wound, or calf pain, call your surgeon's office.   Increase activity slowly as tolerated      Weight Bearing as taught in Physical Therapy      Comments:   Use a walker or crutches as instructed.   Discharge instructions      Comments:   Walk with your walker. Weight bearing as instructed. Home Health Agency will follow you at home for your therapy and to manage your Coumadin. Change your dressing daily. Shower only, no tub bath. Call if any temperatures greater than 101 or any wound complications: [phone] during the day and ask for Dr. Jeannetta Ellis nurse, [facility].   Driving restrictions      Comments:   No driving for 4 weeks   Change dressing      Comments:   Change  dressing on Sunday, then change the dressing daily with sterile 4 x 4 inch gauze dressing and apply TED hose.   Call MD / Call 911      Comments:   If you experience chest pain or shortness of breath, CALL 911 and be transported to the hospital emergency room.  If you develope a fever above 101 F, pus (white drainage) or increased drainage or redness at the wound, or calf pain, call your surgeon's office.   Increase activity slowly as tolerated      Weight Bearing as taught in Physical Therapy      Comments:   Use a walker or crutches as instructed.   Discharge instructions      Comments:   Walk with your walker. Weight bearing as instructed. Home Health Agency will follow you at home for your therapy and to manage your Coumadin. Change your dressing daily. Shower only, no tub bath. Call if any temperatures greater than 101 or any wound complications: [phone] during the day and ask for Dr. Jeannetta Ellis nurse, [facility] [place].   Driving restrictions      Comments:   No driving for 4 weeks   Change dressing      Comments:   Change dressing on Sunday, then change the dressing daily with sterile 4 x 4 inch gauze dressing and apply TED hose.  You may clean the incision with alcohol prior to redressing.     Discharge Medication  List as of 09/24/2011  9:38 AM    START taking these medications   Details  ferrous sulfate 325 (65 FE) MG tablet Take 1 tablet (325 mg total) by mouth 3 (three) times daily after meals., Starting 09/23/2011, Until Sat 09/22/12, Print      CONTINUE these medications which have NOT CHANGED   Details  azithromycin (AZASITE) 1 % ophthalmic solution Place 1 drop into both eyes 1 day or 1 dose.  , Starting 09/12/2011, Until Discontinued, Historical Med    esomeprazole (NEXIUM) 40 MG capsule Take 40 mg by mouth daily as needed. Heart burn, Until Discontinued, Historical Med    lisinopril (PRINIVIL,ZESTRIL) 5 MG tablet Take 5 mg by mouth every morning. , Until Discontinued,  Historical Med    Melatonin 5 MG TABS Take 1 tablet by mouth at bedtime as needed. Sleep , Until Discontinued, Historical Med    Multiple Vitamin (MULTIVITAMIN) tablet Take 1 tablet by mouth daily.  , Until Discontinued, Historical Med    Omega-3 Fatty Acids (FISH OIL) 500 MG CAPS Take 500 mg by mouth once.  , Until Discontinued, Historical Med    orlistat (ALLI) 60 MG capsule Take 60 mg by mouth daily as needed. Take when going to eat fried food, Until Discontinued, Historical Med    oxymetazoline (AFRIN) 0.05 % nasal spray Place 2 sprays into the nose 2 (two) times daily as needed. Allergies , Until Discontinued, Historical Med    pravastatin (PRAVACHOL) 40 MG tablet Take 20 mg by mouth at bedtime. , Until Discontinued, Historical Med      STOP taking these medications     aspirin 325 MG tablet      diphenhydramine-acetaminophen (TYLENOL PM) 25-500 MG TABS      ibuprofen (ADVIL,MOTRIN) 200 MG tablet        Follow-up Information    Follow up with Dejaun Vidrio A in 2 weeks.   Contact information:   [facility] [address] [place] [address] [phone]       Follow up with [facility]. (Registered Nurse, Home Physical Therapy)    Contact information:   [phone]         Signed: Gatlin Kittell A 09/27/2011, 12:57 PM

## 2011-09-24 NOTE — Progress Notes (Signed)
Discharged from floor via w/c, spouse with pt. No changes in assessment. Deshaun Schou   

## 2011-09-24 NOTE — Progress Notes (Signed)
Cm spoke with pt concerning d/c planning. [facility] to provide [facility] and HHPT services. RW delivered by [facility], in room during CM interview with pt. Pt request bandages and tape. RN notiifed of pt's request. [facility] notified of pt's d/c. Services to start Monday Dec 3rd.

## 2011-11-09 ENCOUNTER — Other Ambulatory Visit: Payer: Self-pay | Admitting: Nurse Practitioner

## 2012-02-13 ENCOUNTER — Other Ambulatory Visit: Payer: Self-pay | Admitting: Internal Medicine

## 2012-03-13 ENCOUNTER — Encounter: Payer: Self-pay | Admitting: Gastroenterology

## 2012-04-10 ENCOUNTER — Ambulatory Visit (INDEPENDENT_AMBULATORY_CARE_PROVIDER_SITE_OTHER): Payer: Medicare Other | Admitting: Internal Medicine

## 2012-04-10 ENCOUNTER — Encounter: Payer: Self-pay | Admitting: Internal Medicine

## 2012-04-10 VITALS — BP 118/80 | HR 67 | Temp 97.9°F | Resp 12 | Ht 72.0 in | Wt 249.2 lb

## 2012-04-10 DIAGNOSIS — Z85828 Personal history of other malignant neoplasm of skin: Secondary | ICD-10-CM

## 2012-04-10 DIAGNOSIS — R7309 Other abnormal glucose: Secondary | ICD-10-CM

## 2012-04-10 DIAGNOSIS — Z0000 Encounter for general adult medical examination without abnormal findings: Secondary | ICD-10-CM

## 2012-04-10 DIAGNOSIS — E785 Hyperlipidemia, unspecified: Secondary | ICD-10-CM

## 2012-04-10 LAB — CBC WITH DIFFERENTIAL/PLATELET
Basophils Relative: 0.4 % (ref 0.0–3.0)
Eosinophils Relative: 1.6 % (ref 0.0–5.0)
HCT: 45.3 % (ref 39.0–52.0)
Hemoglobin: 15.1 g/dL (ref 13.0–17.0)
Lymphs Abs: 1.9 10*3/uL (ref 0.7–4.0)
MCV: 91.4 fl (ref 78.0–100.0)
Monocytes Absolute: 0.5 10*3/uL (ref 0.1–1.0)
Neutro Abs: 3.6 10*3/uL (ref 1.4–7.7)
Platelets: 209 10*3/uL (ref 150.0–400.0)
WBC: 6.1 10*3/uL (ref 4.5–10.5)

## 2012-04-10 LAB — HEPATIC FUNCTION PANEL
ALT: 18 U/L (ref 0–53)
AST: 18 U/L (ref 0–37)
Albumin: 4.4 g/dL (ref 3.5–5.2)
Total Bilirubin: 0.4 mg/dL (ref 0.3–1.2)
Total Protein: 7.1 g/dL (ref 6.0–8.3)

## 2012-04-10 LAB — HEMOGLOBIN A1C: Hgb A1c MFr Bld: 5.8 % (ref 4.6–6.5)

## 2012-04-10 LAB — BASIC METABOLIC PANEL
BUN: 14 mg/dL (ref 6–23)
Chloride: 104 mEq/L (ref 96–112)
Potassium: 5.1 mEq/L (ref 3.5–5.1)
Sodium: 140 mEq/L (ref 135–145)

## 2012-04-10 LAB — LIPID PANEL: Cholesterol: 103 mg/dL (ref 0–200)

## 2012-04-10 LAB — TSH: TSH: 1.51 u[IU]/mL (ref 0.35–5.50)

## 2012-04-10 NOTE — Progress Notes (Signed)
Subjective:    Patient ID: Troy Garrison, male    DOB: 1941-02-16, 71 y.o.   MRN: [id number]  HPI  Medicare Wellness Visit:  The following psychosocial & medical history were reviewed as required by Medicare.   Social history: caffeine: no , alcohol: no ,  tobacco use : quit 1994  & exercise : farm work Futures trader.   Home & personal  safety / fall risk:no issues, activities of daily living: no limitations , seatbelt use : yes, and smoke alarm employment : yes .  Power of Attorney/Living Will status : in place  Vision ( as recorded per Nurse) & Hearing  evaluation : Ophth exam 12 mos ago;eyes dry. Audiology F/U now as needed; 30% loss on L. Orientation :oriented X3 , memory & recall :good,  math testing: good,and mood & affect : normal . Depression / anxiety: denied Travel history : 2008 [place] , immunization status :PMH of [place] Barre , transfusion history:  1963 post bleeding ulcer, and preventive health surveillance ( colonoscopies, BMD , etc as per protocol/ SOC): ? Colonoscopy due, Dental care:  Seen every 12 mos . Chart reviewed &  Updated. Active issues reviewed & addressed.       Review of Systems  He is on low-dose ramipril in the setting of fasting hyperglycemia. He denies diagnosis of hypertension. He denies chest pain, palpitations, dyspnea, edema, lightheadedness, or syncope. He is not having polyuria, polyphagia, or polydipsia. He also denies blurred vision, double vision, or loss of vision.  He is compliant with his statin; he denies abdominal pain, bowel changes, or myalgias.          Objective:   Physical Exam Gen.: Healthy and well-nourished in appearance. Alert, appropriate and cooperative throughout exam. Head: Normocephalic without obvious abnormalities; pattern alopecia  Eyes: No corneal or conjunctival inflammation noted.  Extraocular motion intact. Vision grossly normal. Ears: External  ear exam reveals no significant lesions or deformities. Canals clear .TMs  normal. Hearing is grossly normal bilaterally to whisper @ 6 ft. Nose: External nasal exam reveals no deformity or inflammation. Nasal mucosa are pink and moist. No lesions or exudates noted.  Mouth: Oral mucosa and oropharynx reveal no lesions or exudates. Teeth in good repair. Neck: No deformities, masses, or tenderness noted. Range of motion decreased laterally. Thyroid normal. Lungs: Normal respiratory effort; chest expands symmetrically. Lungs are clear to auscultation without rales, wheezes, or increased work of breathing. Heart: Normal rate and rhythm. Normal S1 and S2. No gallop, click, or rub.Grade 1/6 systolic murmur  Abdomen: Bowel sounds normal; abdomen soft and nontender. No masses, organomegaly or hernias noted. Genitalia/ DRE: Scar tissue present in each epididymal area. Varices on the left.Prostate is normal without enlargement, asymmetry, nodularity, or induration.                                          Musculoskeletal/extremities: No deformity or scoliosis noted of  the thoracic or lumbar spine. No clubbing, cyanosis, edema, or deformity noted. Range of motion  Decreased @ knees .Tone & strength  Normal. Chronic toenail changes. Vascular: Carotid, radial artery, dorsalis pedis and  posterior tibial pulses are full and equal. No bruits present. Neurologic: Alert and oriented x3. Deep tendon reflexes symmetrical and normal.          Skin: Intact without suspicious lesions or rashes. Lymph: No cervical, axillary, or inguinal lymphadenopathy present.  Psych: Mood and affect are normal. Normally interactive                                                                                         Assessment & Plan:  #1 Medicare Wellness Exam; criteria met ; data entered #2 Problem List reviewed ; Assessment/ Recommendations made Plan: see Orders

## 2012-04-10 NOTE — Patient Instructions (Addendum)
Preventive Health Care: Exercise at least 30-45 minutes a day,  3-4 days a week.  Eat a low-fat diet with lots of fruits and vegetables, up to 7-9 servings per day.  Consume less than 40 grams of sugar per day from foods & drinks with High Fructose Corn Sugar as #1,2,3 or # 4 on label. Please try to go on My Chart within the next 24 hours to allow me to release the results directly to you.  

## 2012-05-22 IMAGING — CR DG CHEST 2V
2 series · 2 of 2 positions shown · non-contrast
Comparison: None.

CLINICAL DATA: Preop for right total knee arthroplasty.  No chest
complaints.  Hypertension.

CHEST - 2 VIEW

[w chest pa]
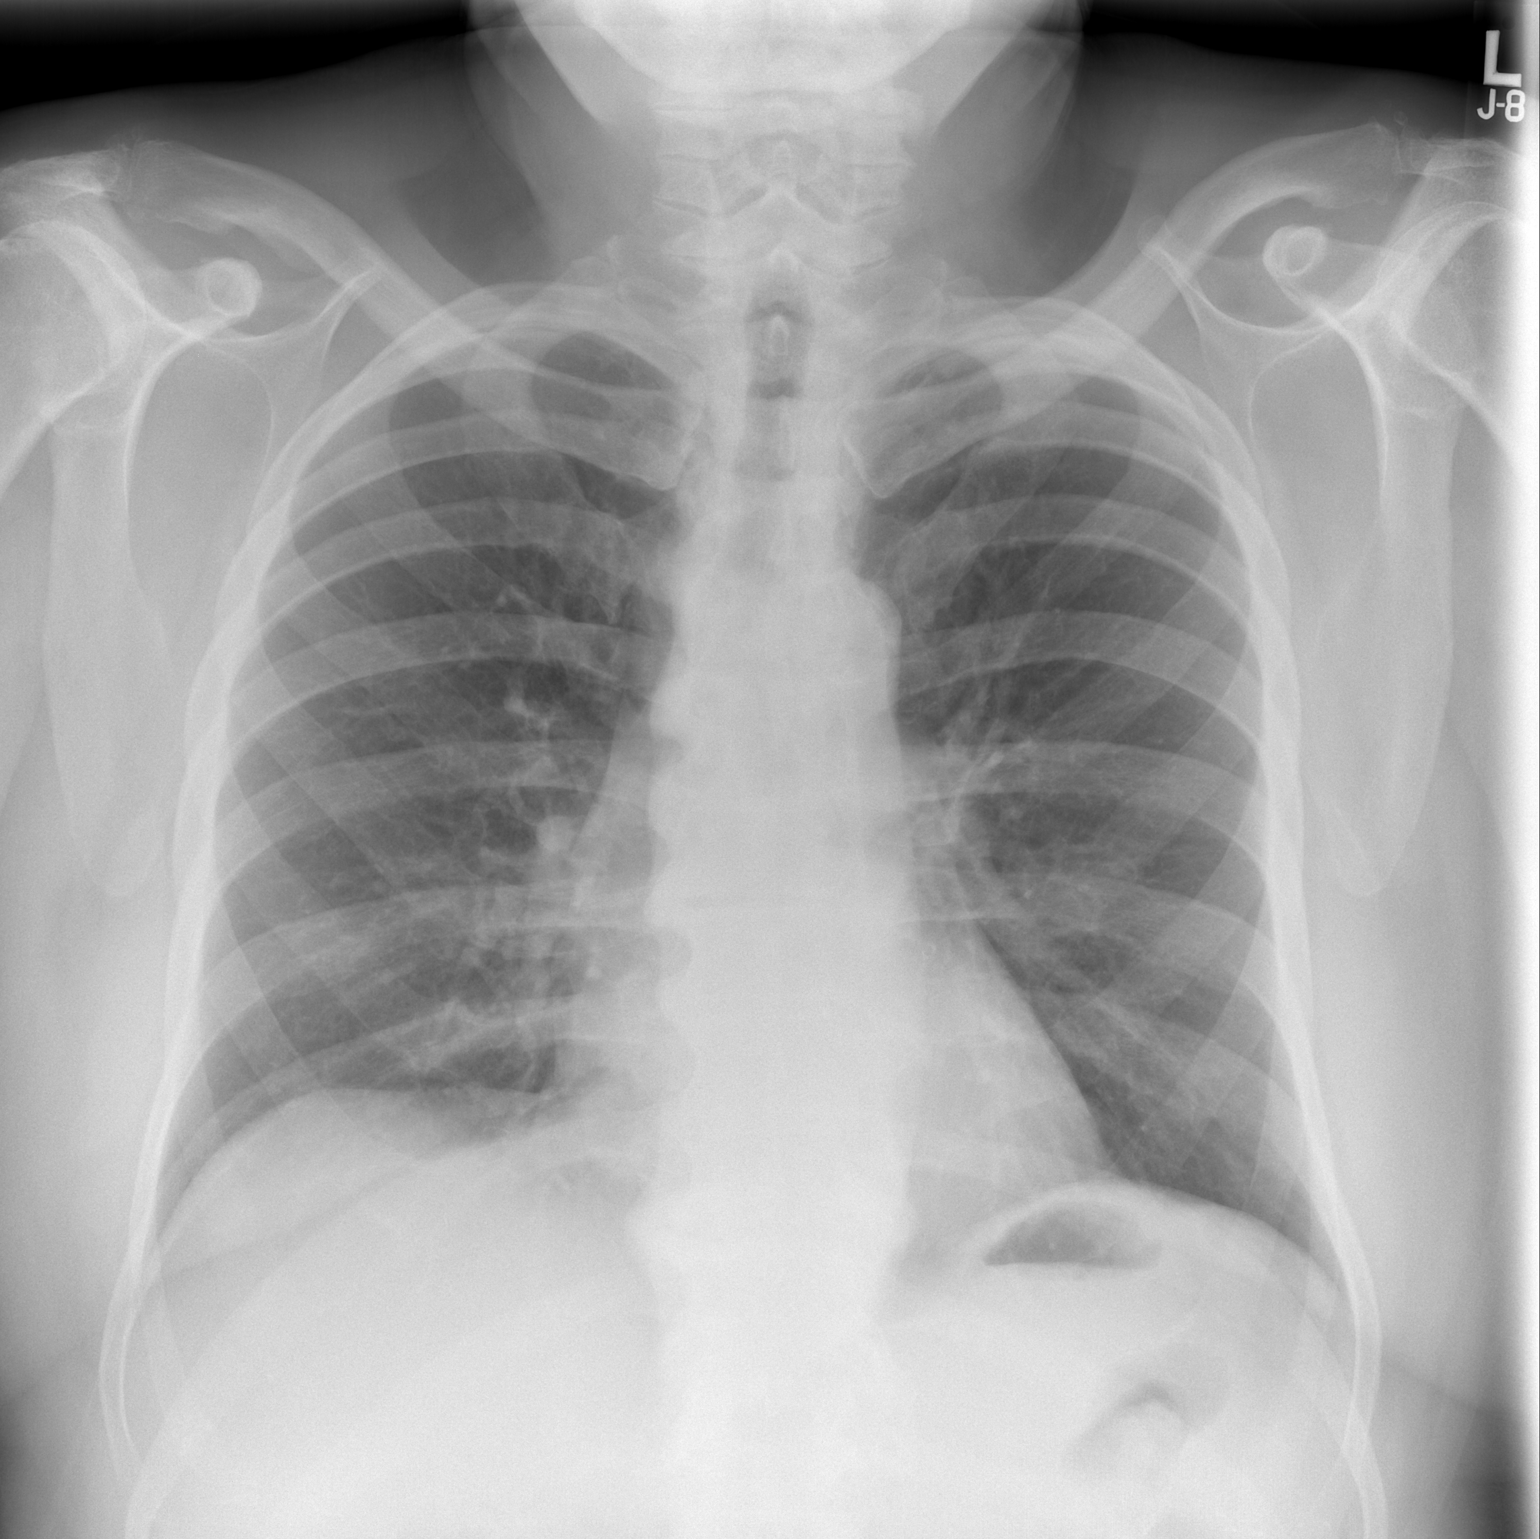

[w chest lat]
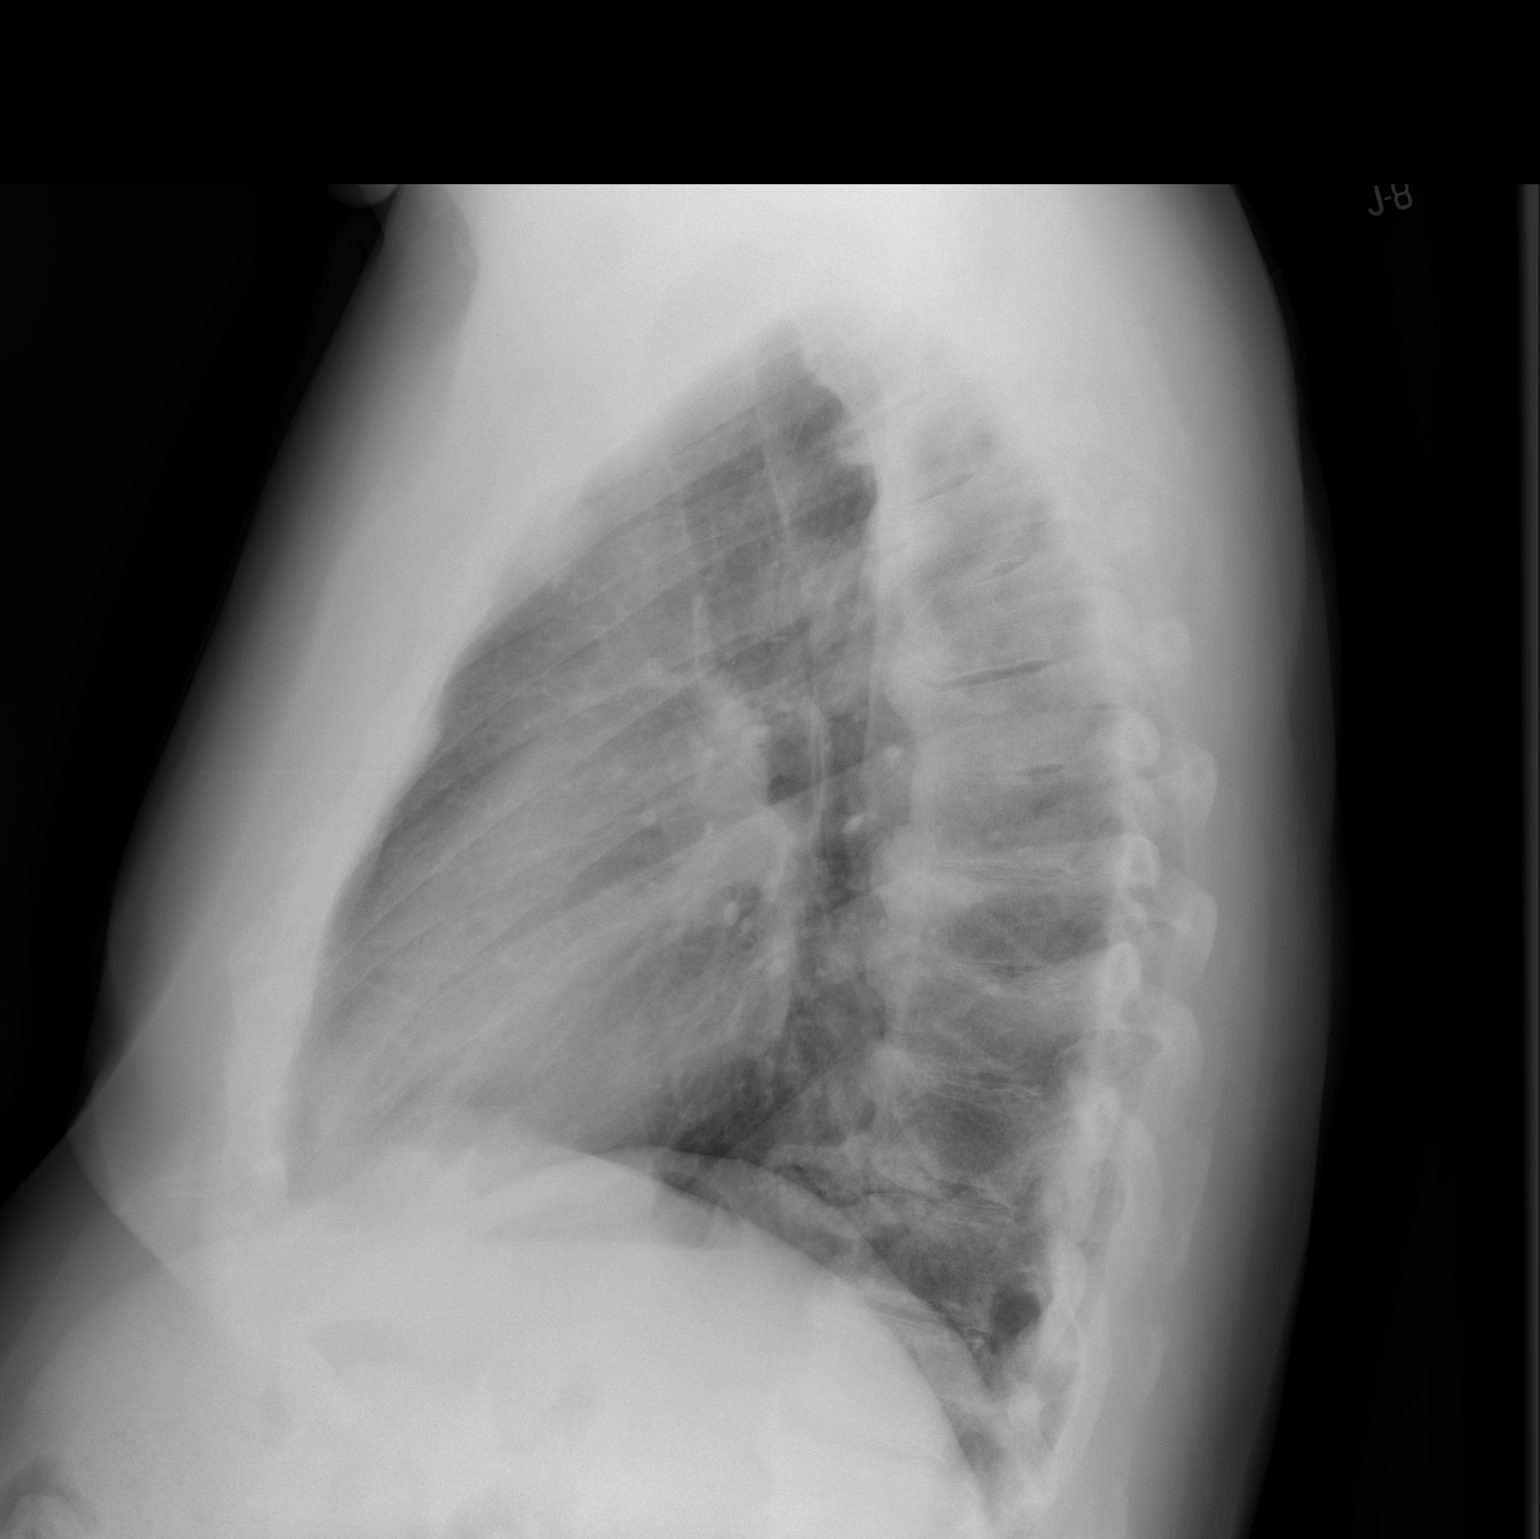

[2 of 2 positions shown; findings below may reference images not displayed]

FINDINGS: Mid thoracic spondylosis versus diffuse idiopathic
skeletal hyperostosis.

Midline trachea.  Normal heart size and mediastinal contours. No
pleural effusion or pneumothorax.  Clear lungs.
IMPRESSION: No acute cardiopulmonary disease.

## 2012-05-24 ENCOUNTER — Other Ambulatory Visit: Payer: Self-pay | Admitting: Cardiology

## 2012-05-24 NOTE — Telephone Encounter (Signed)
..   Requested Prescriptions   Pending Prescriptions Disp Refills  . lisinopril (PRINIVIL,ZESTRIL) 5 MG tablet [Pharmacy Med Name: LISINOPRIL 5MG       TAB] 30 tablet 6    Sig: TAKE ONE TABLET BY MOUTH EVERY DAY

## 2012-07-12 ENCOUNTER — Encounter: Payer: Self-pay | Admitting: Gastroenterology

## 2012-12-08 ENCOUNTER — Other Ambulatory Visit: Payer: Self-pay

## 2012-12-19 ENCOUNTER — Ambulatory Visit (AMBULATORY_SURGERY_CENTER): Payer: Medicare Other | Admitting: *Deleted

## 2012-12-19 VITALS — Ht 72.0 in | Wt 269.6 lb

## 2012-12-19 DIAGNOSIS — Z1211 Encounter for screening for malignant neoplasm of colon: Secondary | ICD-10-CM

## 2012-12-19 MED ORDER — MOVIPREP 100 G PO SOLR: ORAL | Status: DC

## 2012-12-20 ENCOUNTER — Encounter: Payer: Self-pay | Admitting: Gastroenterology

## 2012-12-25 ENCOUNTER — Encounter: Payer: Medicare Other | Admitting: Gastroenterology

## 2013-01-03 ENCOUNTER — Encounter: Payer: Self-pay | Admitting: Gastroenterology

## 2013-01-03 ENCOUNTER — Ambulatory Visit (AMBULATORY_SURGERY_CENTER): Payer: Medicare Other | Admitting: Gastroenterology

## 2013-01-03 VITALS — BP 132/75 | HR 65 | Temp 96.7°F | Resp 19 | Ht 72.0 in | Wt 269.0 lb

## 2013-01-03 DIAGNOSIS — Z860100 Personal history of colon polyps, unspecified: Secondary | ICD-10-CM

## 2013-01-03 DIAGNOSIS — Z1211 Encounter for screening for malignant neoplasm of colon: Secondary | ICD-10-CM

## 2013-01-03 MED ORDER — SODIUM CHLORIDE 0.9 % IV SOLN: 500.0000 mL | INTRAVENOUS | Status: DC

## 2013-01-03 NOTE — Op Note (Signed)
[facility] [address] [place], [address]   COLONOSCOPY PROCEDURE REPORT  PATIENT: Troy Garrison, Troy Garrison  MR#: [id number] BIRTHDATE: 03-06-1941 , 71  yrs. old GENDER: Male ENDOSCOPIST: Meryl Dare, MD, [facility] PROCEDURE DATE:  01/03/2013 PROCEDURE:   Colonoscopy, screening ASA CLASS:   Class II INDICATIONS:Patient's personal history of adenomatous colon polyps.  MEDICATIONS: MAC sedation, administered by CRNA and propofol (Diprivan) 200mg  IV DESCRIPTION OF PROCEDURE:   After the risks benefits and alternatives of the procedure were thoroughly explained, informed consent was obtained.  A digital rectal exam revealed no abnormalities of the rectum.   The LB [id number]-H180AL [id number]  endoscope was introduced through the anus and advanced to the cecum, which was identified by both the appendix and ileocecal valve. No adverse events experienced.   The quality of the prep was good, using MoviPrep  The instrument was then slowly withdrawn as the colon was fully examined.   COLON FINDINGS: Prior [place] ink tattoos noted.   The colon was otherwise normal.  There was no diverticulosis, inflammation, polyps or cancers unless previously stated.  Retroflexed views revealed small internal hemorrhoids. The time to cecum=3 minutes 57 seconds.  Withdrawal time=8 minutes 44 seconds.  The scope was withdrawn and the procedure completed. COMPLICATIONS: There were no complications.  ENDOSCOPIC IMPRESSION: 1.   Prior [place] ink tattoos noted 2.   Small internal hemorrhoids  RECOMMENDATIONS: 1.  Repeat Colonoscopy in 5 years.  eSigned:  Meryl Dare, MD, [facility] 01/03/2013 11:17 AM

## 2013-01-03 NOTE — Patient Instructions (Addendum)
YOU HAD AN ENDOSCOPIC PROCEDURE TODAY AT THE [facility]: Refer to the procedure report that was given to you for any specific questions about what was found during the examination.  If the procedure report does not answer your questions, please call your gastroenterologist to clarify.  If you requested that your care partner not be given the details of your procedure findings, then the procedure report has been included in a sealed envelope for you to review at your convenience later.  YOU SHOULD EXPECT: Some feelings of bloating in the abdomen. Passage of more gas than usual.  Walking can help get rid of the air that was put into your GI tract during the procedure and reduce the bloating. If you had a lower endoscopy (such as a colonoscopy or flexible sigmoidoscopy) you may notice spotting of blood in your stool or on the toilet paper. If you underwent a bowel prep for your procedure, then you may not have a normal bowel movement for a few days.  DIET: Your first meal following the procedure should be a light meal and then it is ok to progress to your normal diet.  A half-sandwich or bowl of soup is an example of a good first meal.  Heavy or fried foods are harder to digest and may make you feel nauseous or bloated.  Likewise meals heavy in dairy and vegetables can cause extra gas to form and this can also increase the bloating.  Drink plenty of fluids but you should avoid alcoholic beverages for 24 hours.  ACTIVITY: Your care partner should take you home directly after the procedure.  You should plan to take it easy, moving slowly for the rest of the day.  You can resume normal activity the day after the procedure however you should NOT DRIVE or use heavy machinery for 24 hours (because of the sedation medicines used during the test).    SYMPTOMS TO REPORT IMMEDIATELY: A gastroenterologist can be reached at any hour.  During normal business hours, 8:30 AM to 5:00 PM Monday through Friday,  call [phone].  After hours and on weekends, please call the GI answering service at [phone] who will take a message and have the physician on call contact you.   Following lower endoscopy (colonoscopy or flexible sigmoidoscopy):  Excessive amounts of blood in the stool  Significant tenderness or worsening of abdominal pains  Swelling of the abdomen that is new, acute  Fever of 100F or higher  FOLLOW UP: If any biopsies were taken you will be contacted by phone or by letter within the next 1-3 weeks.  Call your gastroenterologist if you have not heard about the biopsies in 3 weeks.  Our staff will call the home number listed on your records the next business day following your procedure to check on you and address any questions or concerns that you may have at that time regarding the information given to you following your procedure. This is a courtesy call and so if there is no answer at the home number and we have not heard from you through the emergency physician on call, we will assume that you have returned to your regular daily activities without incident.  SIGNATURES/CONFIDENTIALITY: You and/or your care partner have signed paperwork which will be entered into your electronic medical record.  These signatures attest to the fact that that the information above on your After Visit Summary has been reviewed and is understood.  Full responsibility of the confidentiality of this   discharge information lies with you and/or your care-partner.   Resume medications. Information given on hemorrhoids and high fiber diet with discharge instructions. 

## 2013-01-03 NOTE — Progress Notes (Signed)
Patient did not experience any of the following events: a burn prior to discharge; a fall within the facility; wrong site/side/patient/procedure/implant event; or a hospital transfer or hospital admission upon discharge from the facility. (G8907) Patient did not have preoperative order for IV antibiotic SSI prophylaxis. (G8918)  

## 2013-01-04 ENCOUNTER — Telehealth: Payer: Self-pay

## 2013-01-04 ENCOUNTER — Other Ambulatory Visit: Payer: Self-pay

## 2013-01-04 MED ORDER — LISINOPRIL 5 MG PO TABS: 5.0000 mg | ORAL_TABLET | Freq: Every day | ORAL | Status: DC

## 2013-01-04 NOTE — Telephone Encounter (Signed)
..   Requested Prescriptions   Signed Prescriptions Disp Refills  . lisinopril (PRINIVIL,ZESTRIL) 5 MG tablet 30 tablet 3    Sig: Take 1 tablet (5 mg total) by mouth daily.    Authorizing Provider: Rollene Rotunda    Ordering User: Christella Hartigan, ROSE M  ..Patient needs to contact office to schedule  Appointment  for future refills.[phone]. Thank you.

## 2013-01-04 NOTE — Telephone Encounter (Signed)
  Follow up Call-  Call back number 01/03/2013  Post procedure Call Back phone  # [phone]  Permission to leave phone message Yes     Patient questions:  Do you have a fever, pain , or abdominal swelling? no Pain Score  0 *  Have you tolerated food without any problems? yes  Have you been able to return to your normal activities? yes  Do you have any questions about your discharge instructions: Diet   no Medications  no Follow up visit  no  Do you have questions or concerns about your Care? no  Actions: * If pain score is 4 or above: No action needed, pain <4.  No problems per the pt. Maw

## 2013-02-10 ENCOUNTER — Other Ambulatory Visit: Payer: Self-pay | Admitting: Internal Medicine

## 2013-04-24 ENCOUNTER — Ambulatory Visit (INDEPENDENT_AMBULATORY_CARE_PROVIDER_SITE_OTHER): Payer: Medicare Other | Admitting: Internal Medicine

## 2013-04-24 ENCOUNTER — Other Ambulatory Visit: Payer: Self-pay | Admitting: Internal Medicine

## 2013-04-24 ENCOUNTER — Encounter: Payer: Self-pay | Admitting: Internal Medicine

## 2013-04-24 VITALS — BP 128/90 | HR 73 | Temp 98.0°F | Resp 12 | Ht 72.0 in | Wt 265.0 lb

## 2013-04-24 DIAGNOSIS — Z85828 Personal history of other malignant neoplasm of skin: Secondary | ICD-10-CM

## 2013-04-24 DIAGNOSIS — R7309 Other abnormal glucose: Secondary | ICD-10-CM

## 2013-04-24 DIAGNOSIS — E785 Hyperlipidemia, unspecified: Secondary | ICD-10-CM

## 2013-04-24 DIAGNOSIS — Z0000 Encounter for general adult medical examination without abnormal findings: Secondary | ICD-10-CM

## 2013-04-24 DIAGNOSIS — I10 Essential (primary) hypertension: Secondary | ICD-10-CM

## 2013-04-24 DIAGNOSIS — K219 Gastro-esophageal reflux disease without esophagitis: Secondary | ICD-10-CM

## 2013-04-24 LAB — HEPATIC FUNCTION PANEL
AST: 27 U/L (ref 0–37)
Albumin: 4.5 g/dL (ref 3.5–5.2)
Alkaline Phosphatase: 80 U/L (ref 39–117)
Total Protein: 7.6 g/dL (ref 6.0–8.3)

## 2013-04-24 LAB — BASIC METABOLIC PANEL
CO2: 28 mEq/L (ref 19–32)
Chloride: 103 mEq/L (ref 96–112)
Glucose, Bld: 128 mg/dL — ABNORMAL HIGH (ref 70–99)
Potassium: 4.5 mEq/L (ref 3.5–5.1)
Sodium: 138 mEq/L (ref 135–145)

## 2013-04-24 LAB — CBC WITH DIFFERENTIAL/PLATELET
Basophils Absolute: 0 10*3/uL (ref 0.0–0.1)
Basophils Relative: 0.4 % (ref 0.0–3.0)
Eosinophils Absolute: 0.1 10*3/uL (ref 0.0–0.7)
HCT: 46.8 % (ref 39.0–52.0)
Hemoglobin: 15.7 g/dL (ref 13.0–17.0)
Lymphocytes Relative: 21.9 % (ref 12.0–46.0)
Lymphs Abs: 1.5 10*3/uL (ref 0.7–4.0)
MCHC: 33.5 g/dL (ref 30.0–36.0)
Neutro Abs: 4.7 10*3/uL (ref 1.4–7.7)
RBC: 5.02 Mil/uL (ref 4.22–5.81)
RDW: 13.6 % (ref 11.5–14.6)

## 2013-04-24 LAB — HEMOGLOBIN A1C: Hgb A1c MFr Bld: 6.3 % (ref 4.6–6.5)

## 2013-04-24 LAB — MICROALBUMIN / CREATININE URINE RATIO: Creatinine,U: 131.7 mg/dL

## 2013-04-24 MED ORDER — PRAVASTATIN SODIUM 40 MG PO TABS: ORAL_TABLET | ORAL | Status: DC

## 2013-04-24 NOTE — Patient Instructions (Addendum)
Reflux of gastric acid may be asymptomatic as this may occur mainly during sleep.The triggers for reflux  include stress; the "aspirin family" ; alcohol; peppermint; and caffeine (coffee, tea, cola, and chocolate). The aspirin family would include aspirin and the nonsteroidal agents such as ibuprofen &  Naproxen. Tylenol would not cause reflux. If having symptoms ; food & drink should be avoided for @ least 2 hours before going to bed.  

## 2013-04-24 NOTE — Progress Notes (Signed)
Subjective:    Patient ID: Troy Garrison, male    DOB: 08-01-1941, 72 y.o.   MRN: [id number]  HPI Medicare Wellness Visit:  Psychosocial & medical history were reviewed as required by Medicare (abuse,antisocial behavioral risks,firearm risk).  Social history: caffeine: none , alcohol: no  ,  tobacco use:   Quit 1994 Exercise : farm work   Nurse, mental health / fall risk:no Limitations of activities of daily living:no Seatbelt  and smoke alarm use:yes Power of Attorney/Living Will status : in place Ophthalmology exam status :current Hearing evaluation status:current Orientation :oriented X 3 Memory & recall :good Math testing:good Active depression / anxiety:denied Foreign travel history : 2004 [place] Immunization status for Shingles /Flu/ PNA/ tetanus :PMH of Guillan-Barre Transfusion history:   Preventive health surveillance status of colonoscopy, BMD as per protocol/ WUJ:[id number] Dental care: every 6 mos  Chart reviewed &  Updated. Active issues reviewed & addressed.      Review of Systems He is on a heart healthy diet; he exercises on average 2 hrs/ day without symptoms. Specifically he denies chest pain, palpitations, dyspnea, or claudication. Family history is positive for premature coronary disease in brother & father. Advanced cholesterol testing reveals his LDL goal was less than 110.     Objective:   Physical Exam Gen.:  well-nourished in appearance. Alert, appropriate and cooperative throughout exam. Head: Normocephalic without obvious abnormalities; pattern alopecia  Eyes: No corneal or conjunctival inflammation noted. Ptosis bilaterally. Extraocular motion intact. Vision checked by CMA Ears: External  ear exam reveals no significant lesions or deformities. Canals clear .TMs normal. Hearing is grossly normal bilaterally. Nose: External nasal exam reveals no deformity or inflammation. Nasal mucosa are pink and moist. No lesions or exudates noted.  Mouth: Oral  mucosa and oropharynx reveal no lesions or exudates. Teeth in good repair. Neck: No deformities, masses, or tenderness noted. Range of motion decreased. Thyroid normal. Lungs: Normal respiratory effort; chest expands symmetrically. Lungs are clear to auscultation without rales, wheezes, or increased work of breathing. Heart: Normal rate and rhythm. Normal S1 and S2. No gallop, click, or rub.Grade 1/6 systolic murmur. Abdomen: Bowel sounds normal; abdomen soft and nontender. No masses, organomegaly or hernias noted. Prostate checked @ colonoscopy ; repeat declined. Serial PSAs have been < 1.28                              Musculoskeletal/extremities: No deformity or scoliosis noted of  the thoracic or lumbar spine.  No clubbing, cyanosis, edema, or significant extremity  deformity noted. Range of motion decreased @ knees .Tone & strength  Normal. Joints normal . Toe nails thickened. Able to lie down & sit up w/o help. Negative SLR bilaterally Vascular: Carotid, radial artery, dorsalis pedis and  posterior tibial pulses are full and equal. No bruits present. Neurologic: Alert and oriented x3. Deep tendon reflexes symmetrical and normal.       Skin: Intact without suspicious lesions or rashes. Lymph: No cervical, axillary lymphadenopathy present. Psych: Mood and affect are normal. Normally interactive  Assessment & Plan:  #1 Medicare Wellness Exam; criteria met ; data entered #2 Problem List/Diagnoses reviewed Plan:  Assessments made/ Orders entered  

## 2013-04-25 LAB — NMR LIPOPROFILE WITH LIPIDS
Cholesterol, Total: 137 mg/dL (ref <200)
HDL Particle Number: 28.4 umol/L — ABNORMAL LOW (ref >=30.5)
HDL Size: 8.6 nm — ABNORMAL LOW (ref >=9.2)
HDL-C: 41 mg/dL (ref >=40)
Large HDL-P: <1.3 umol/L — ABNORMAL LOW (ref >=4.8)
Large VLDL-P: 1.3 nmol/L (ref <=2.7)
Triglycerides: 86 mg/dL (ref <150)

## 2013-05-29 ENCOUNTER — Other Ambulatory Visit: Payer: Self-pay

## 2013-08-07 ENCOUNTER — Other Ambulatory Visit: Payer: Self-pay | Admitting: Internal Medicine

## 2013-08-07 NOTE — Telephone Encounter (Signed)
Pravastatin refill sent to pharmacy

## 2013-08-29 ENCOUNTER — Other Ambulatory Visit: Payer: Self-pay

## 2013-12-18 ENCOUNTER — Encounter: Payer: Self-pay | Admitting: Internal Medicine

## 2014-02-06 ENCOUNTER — Encounter: Payer: Self-pay | Admitting: Internal Medicine

## 2014-02-06 MED ORDER — PRAVASTATIN SODIUM 40 MG PO TABS: ORAL_TABLET | ORAL | Status: DC

## 2014-04-15 ENCOUNTER — Encounter: Payer: Self-pay | Admitting: Internal Medicine

## 2014-04-24 ENCOUNTER — Telehealth: Payer: Self-pay | Admitting: *Deleted

## 2014-04-24 NOTE — Telephone Encounter (Addendum)
Medication List and allergies: Reviewed and updated  90 Day supply/mail order:  [facility] Local prescriptions: [facility] [place]  Immunizations Due:  PNA   A/P FH, PSH, or Personal Hx: Reviewed and updated Flu vaccine: Hx G[place]ain-Barre, unable to receive flu shot Tdap: 01/2006 PNA: Never Shingles: Never CCS: 12/2012 [place] Tattoos, small internal hemmorhoids PSA: 01/2011 1.28  To Discuss with Provider: Not at this time

## 2014-04-24 NOTE — Telephone Encounter (Deleted)
Left message with patients wife for him to return my call regarding upcoming appt.

## 2014-04-28 ENCOUNTER — Encounter: Payer: Self-pay | Admitting: Internal Medicine

## 2014-04-28 ENCOUNTER — Ambulatory Visit (INDEPENDENT_AMBULATORY_CARE_PROVIDER_SITE_OTHER): Payer: Commercial Managed Care - HMO | Admitting: Internal Medicine

## 2014-04-28 VITALS — BP 166/82 | HR 75 | Temp 98.2°F | Ht 72.0 in | Wt 272.6 lb

## 2014-04-28 DIAGNOSIS — R7309 Other abnormal glucose: Secondary | ICD-10-CM

## 2014-04-28 DIAGNOSIS — J309 Allergic rhinitis, unspecified: Secondary | ICD-10-CM | POA: Insufficient documentation

## 2014-04-28 DIAGNOSIS — J3089 Other allergic rhinitis: Secondary | ICD-10-CM

## 2014-04-28 DIAGNOSIS — I10 Essential (primary) hypertension: Secondary | ICD-10-CM

## 2014-04-28 DIAGNOSIS — I4892 Unspecified atrial flutter: Secondary | ICD-10-CM

## 2014-04-28 DIAGNOSIS — Z85828 Personal history of other malignant neoplasm of skin: Secondary | ICD-10-CM

## 2014-04-28 DIAGNOSIS — J302 Other seasonal allergic rhinitis: Secondary | ICD-10-CM

## 2014-04-28 DIAGNOSIS — N400 Enlarged prostate without lower urinary tract symptoms: Secondary | ICD-10-CM

## 2014-04-28 DIAGNOSIS — R7303 Prediabetes: Secondary | ICD-10-CM

## 2014-04-28 DIAGNOSIS — Z0000 Encounter for general adult medical examination without abnormal findings: Secondary | ICD-10-CM

## 2014-04-28 DIAGNOSIS — E785 Hyperlipidemia, unspecified: Secondary | ICD-10-CM

## 2014-04-28 DIAGNOSIS — R002 Palpitations: Secondary | ICD-10-CM

## 2014-04-28 LAB — COMPREHENSIVE METABOLIC PANEL
ALT: 25 U/L (ref 0–53)
AST: 27 U/L (ref 0–37)
Albumin: 4.3 g/dL (ref 3.5–5.2)
Alkaline Phosphatase: 82 U/L (ref 39–117)
BILIRUBIN TOTAL: 0.5 mg/dL (ref 0.2–1.2)
BUN: 14 mg/dL (ref 6–23)
CO2: 28 mEq/L (ref 19–32)
CREATININE: 0.8 mg/dL (ref 0.4–1.5)
Calcium: 9.3 mg/dL (ref 8.4–10.5)
Chloride: 103 mEq/L (ref 96–112)
GFR: 98.44 mL/min (ref >60.00)
Glucose, Bld: 144 mg/dL — ABNORMAL HIGH (ref 70–99)
Potassium: 4.9 mEq/L (ref 3.5–5.1)
Sodium: 138 mEq/L (ref 135–145)
Total Protein: 6.6 g/dL (ref 6.0–8.3)

## 2014-04-28 LAB — LIPID PANEL
CHOL/HDL RATIO: 4
Cholesterol: 134 mg/dL (ref 0–200)
HDL: 35.5 mg/dL — AB (ref >39.00)
LDL Cholesterol: 88 mg/dL (ref 0–99)
NONHDL: 98.5
Triglycerides: 55 mg/dL (ref 0.0–149.0)
VLDL: 11 mg/dL (ref 0.0–40.0)

## 2014-04-28 LAB — PSA: PSA: 1.69 ng/mL (ref 0.10–4.00)

## 2014-04-28 LAB — HEMOGLOBIN A1C: Hgb A1c MFr Bld: 6.2 % (ref 4.6–6.5)

## 2014-04-28 NOTE — Assessment & Plan Note (Signed)
Recommend to discontinue afrin and start nasocort

## 2014-04-28 NOTE — Assessment & Plan Note (Addendum)
Prostate gland  today slightly asymmetric, see physical exam. Will check a PSA and consider a urology referral

## 2014-04-28 NOTE — Assessment & Plan Note (Addendum)
Tdap: 01/2006  PNA: Never , declined d/t [place] Barre Shingles: Never   CCS: 12/2012 noted  [place] Ink Tattoos, small internal hemmorhoids , next 5 years per report  Diet-exercise discussed

## 2014-04-28 NOTE — Progress Notes (Signed)
Subjective:    Patient ID: Troy Garrison, male    DOB: [birth year], 73 y.o.   MRN: [id number]  DOS:  04/28/2014 Type of visit - description:  New pt, transferring from Dr Linna Darner, request a CPX Here for Medicare AWV:  1. Risk factors based on Past M, S, F history: reviewed 2. Physical Activities: golf x3/week, has a farm 3. Depression/mood: neg screening  4. Hearing:  Some L hearing lose dx ~ 2011 5. ADL's:  Independent  6. Fall Risk: low, counseled 7. home Safety: does feel safe at home  8. Height, weight, & visual acuity: see VS, sees eye doctor q year 9. Counseling: provided 10. Labs ordered based on risk factors: if needed  11. Referral Coordination: if needed 12. Care Plan, see assessment and plan  13. Cognitive Assessment:cognition /motor skills normal for age   In addition, today we discussed the following: HTN-- off medications for a while, BP today slightly elevated, ambulatory BPs usually in the 130s High cholesterol, good compliance with Pravachol 40 mg half tablet daily.  GERD, well-controlled with as needed medications. Allergic rhinitis, uses Afrin  consistently   ROS No  CP, SOB, palpitations   Denies  nausea, vomiting diarrhea, blood in the stools No abdominal pain  (-) cough, sputum production (-) wheezing, chest congestion No dysuria, gross hematuria, difficulty urinating      Past Medical History  Diagnosis Date  . Hemorrhoids   . Peptic ulcer 1964    with bleed ; transfused  . Osteoarthritis   . Left axis deviation 1999  . Hearing loss, neural     Left ear  . Injury of tendon of long head of biceps   . Atrial flutter     S/P RFCA with Dr. Caryl Comes 6/12  . GERD (gastroesophageal reflux disease)   . Hyperlipidemia   . Hyperglycemia   . Cancer     SKIN; BASAL & SQUAMOUS CELL  . History of Guillain-Barre syndrome 2003  . Prediabetes 07/02/2008    Qualifier: Diagnosis of  By: Linna Darner MD, Gwyndolyn Saxon   Random glucoses were 127 and 161 in November 2011        Past Surgical History  Procedure Laterality Date  . Vasectomy      Scar tissue removed 2 years after the vasectomy  . Replacement total knee  2000    Left Knee  . Colonoscopy with polypectomy  2011    Internal hemorrhoids; [facility]  . Ablation of dysrhythmic focus  2012     for AF;Dr Caryl Comes  . Total knee arthroplasty  09/21/2011    Procedure: TOTAL KNEE ARTHROPLASTY;  Surgeon: Tobi Bastos;  Location: [facility];  Service: Orthopedics;  Laterality: Right;  . Colonoscopy   2004 & 2014    negative    History   Social History  . Marital Status: Married    Spouse Name: N/A    Number of Children: 34  . Years of Education: N/A   Occupational History  . 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 Smoking status: Former Smoker    Quit date: 10/24/1992  . Smokeless tobacco: Never Used     Comment: smoked 1960-1994, up to < 1 ppd  . Alcohol Use: Yes     Comment: wine  . Drug Use: No  . Sexual Activity: Not on file   Other Topics Concern  . Not on file   Social History Narrative  . No narrative  on file     Family History  Problem Relation Age of Onset  . Heart attack Father 86  . Heart failure Father     Died @ 35  . Lupus Mother   . Heart attack Brother 97    MI @ 52;.CABG @ 18  . Diabetes Brother   . Prostate cancer Paternal Grandfather     22  . Cancer Maternal Uncle     X2, unknown primary  . Stroke Maternal Grandmother     > 65  . Peripheral vascular disease Brother     S/P leg amputation  . Colon cancer Neg Hx   . Stomach cancer Neg Hx        Medication List       This list is accurate as of: 04/28/14 10:37 AM.  Always use your most recent med list.               aspirin 325 MG EC tablet  Take 325 mg by mouth daily.     esomeprazole 40 MG capsule  Commonly known as:  NEXIUM  Take 40 mg by mouth daily as needed. Heart burn     Fish Oil 500 MG Caps  Take 500 mg by mouth once.     ibuprofen 200 MG tablet  Commonly  known as:  ADVIL,MOTRIN  Take 200 mg by mouth as needed.     multivitamin tablet  Take 1 tablet by mouth daily.     pravastatin 40 MG tablet  Commonly known as:  PRAVACHOL  1 BY MOUTH AT BEDTIME           Objective:   Physical Exam BP 166/82  Pulse 75  Temp(Src) 98.2 F (36.8 C) (Oral)  Ht 6' (1.829 m)  Wt 272 lb 9.6 oz (123.651 kg)  BMI 36.96 kg/m2  SpO2 97% General -- alert, well-developed, NAD.  Neck --no thyromegaly  HEENT-- Not pale.   Lungs -- normal respiratory effort, no intercostal retractions, no accessory muscle use, and normal breath sounds.  Heart-- normal rate, regular rhythm, no murmur.  Abdomen-- Not distended, good bowel sounds,soft, non-tender. No bruit . Rectal--  Normal sphincter tone. No rectal masses or tenderness. Stool brown   Prostate--Prostate gland is firm and smooth, Slightly larger left lobule? Not tender or nodular Extremities-- no pretibial edema bilaterally  Neurologic--  alert & oriented X3. Speech normal, gait appropriate for age, strength symmetric and appropriate for age.  Psych-- Cognition and judgment appear intact. Cooperative with normal attention span and concentration. No anxious or depressed appearing.        Assessment & Plan:

## 2014-04-28 NOTE — Progress Notes (Signed)
Pre visit review using our clinic review tool, if applicable. No additional management support is needed unless otherwise documented below in the visit note. 

## 2014-04-28 NOTE — Assessment & Plan Note (Signed)
Remains asymptomatic.

## 2014-04-28 NOTE — Assessment & Plan Note (Signed)
Has pre-diabetes, DX was discussed with the patient, diet and exercise encouraged, check A1c

## 2014-04-28 NOTE — Assessment & Plan Note (Signed)
Has not seen dermatology lately, he does do self surveillance, has seen no worrisome lesions

## 2014-04-28 NOTE — Assessment & Plan Note (Addendum)
BP slightly elevated today but reports normal ambulatory BPs.   plan: Monitor BPs, see instructions. EKG nsr, no specific changes

## 2014-04-28 NOTE — Patient Instructions (Signed)
Get your blood work before you leave   Check the  blood pressure 2 or 3 times a month be sure it is between 110/60 and 140/85. Ideal blood pressure is 120/80. If it is consistently higher or lower, let me know  Stop afrin, use NASOCORT OTC 2 sprays a day   Next visit is for routine check up regards your blood sugar , blood pressure  in 6 months  No need to come back fasting Please make an appointment       Fall Prevention and [place] cause injuries and can affect all age groups. It is possible to use preventive measures to significantly decrease the likelihood of falls. There are many simple measures which can make your home safer and prevent falls. OUTDOORS  Repair cracks and edges of walkways and driveways.  Remove high doorway thresholds.  Trim shrubbery on the main path into your home.  Have good outside lighting.  Clear walkways of tools, rocks, debris, and clutter.  Check that handrails are not broken and are securely fastened. Both sides of steps should have handrails.  Have leaves, snow, and ice cleared regularly.  Use sand or salt on walkways during winter months.  In the garage, clean up grease or oil spills. BATHROOM  Install night lights.  Install grab bars by the toilet and in the tub and shower.  Use non-skid mats or decals in the tub or shower.  Place a plastic non-slip stool in the shower to sit on, if needed.  Keep floors dry and clean up all water on the floor immediately.  Remove soap buildup in the tub or shower on a regular basis.  Secure bath mats with non-slip, double-sided rug tape.  Remove throw rugs and tripping hazards from the floors. BEDROOMS  Install night lights.  Make sure a bedside light is easy to reach.  Do not use oversized bedding.  Keep a telephone by your bedside.  Have a firm chair with side arms to use for getting dressed.  Remove throw rugs and tripping hazards from the floor. KITCHEN  Keep handles  on pots and pans turned toward the center of the stove. Use back burners when possible.  Clean up spills quickly and allow time for drying.  Avoid walking on wet floors.  Avoid hot utensils and knives.  Position shelves so they are not too high or low.  Place commonly used objects within easy reach.  If necessary, use a sturdy step stool with a grab bar when reaching.  Keep electrical cables out of the way.  Do not use floor polish or wax that makes floors slippery. If you must use wax, use non-skid floor wax.  Remove throw rugs and tripping hazards from the floor. STAIRWAYS  Never leave objects on stairs.  Place handrails on both sides of stairways and use them. Fix any loose handrails. Make sure handrails on both sides of the stairways are as long as the stairs.  Check carpeting to make sure it is firmly attached along stairs. Make repairs to worn or loose carpet promptly.  Avoid placing throw rugs at the top or bottom of stairways, or properly secure the rug with carpet tape to prevent slippage. Get rid of throw rugs, if possible.  Have an electrician put in a light switch at the top and bottom of the stairs. OTHER FALL PREVENTION TIPS  Wear low-heel or rubber-soled shoes that are supportive and fit well. Wear closed toe shoes.  When using a stepladder,  make sure it is fully opened and both spreaders are firmly locked. Do not climb a closed stepladder.  Add color or contrast paint or tape to grab bars and handrails in your home. Place contrasting color strips on first and last steps.  Learn and use mobility aids as needed. Install an electrical emergency response system.  Turn on lights to avoid dark areas. Replace light bulbs that burn out immediately. Get light switches that glow.  Arrange furniture to create clear pathways. Keep furniture in the same place.  Firmly attach carpet with non-skid or double-sided tape.  Eliminate uneven floor surfaces.  Select a  carpet pattern that does not visually hide the edge of steps.  Be aware of all pets. OTHER HOME SAFETY TIPS  Set the water temperature for 120 F (48.8 C).  Keep emergency numbers on or near the telephone.  Keep smoke detectors on every level of the home and near sleeping areas. Document Released: 09/30/2002 Document Revised: 04/10/2012 Document Reviewed: 12/30/2011 [facility] Patient Information 2015 [place], [place]. This information is not intended to replace advice given to you by your health care provider. Make sure you discuss any questions you have with your health care provider.

## 2014-04-28 NOTE — Assessment & Plan Note (Addendum)
Due for labs, good compliance with Pravachol 20 mg at night

## 2014-04-29 ENCOUNTER — Telehealth: Payer: Self-pay | Admitting: Internal Medicine

## 2014-04-29 NOTE — Telephone Encounter (Signed)
Relevant patient education assigned to patient using Emmi. ° °

## 2014-05-27 ENCOUNTER — Encounter: Payer: Self-pay | Admitting: Internal Medicine

## 2014-05-28 ENCOUNTER — Other Ambulatory Visit: Payer: Self-pay | Admitting: *Deleted

## 2014-05-28 MED ORDER — LISINOPRIL 10 MG PO TABS: 10.0000 mg | ORAL_TABLET | Freq: Every day | ORAL | Status: DC

## 2014-06-27 ENCOUNTER — Other Ambulatory Visit (INDEPENDENT_AMBULATORY_CARE_PROVIDER_SITE_OTHER): Payer: Commercial Managed Care - HMO

## 2014-06-27 DIAGNOSIS — I10 Essential (primary) hypertension: Secondary | ICD-10-CM

## 2014-06-27 LAB — BASIC METABOLIC PANEL
BUN: 15 mg/dL (ref 6–23)
CALCIUM: 8.9 mg/dL (ref 8.4–10.5)
CO2: 29 mEq/L (ref 19–32)
Chloride: 102 mEq/L (ref 96–112)
Creatinine, Ser: 0.8 mg/dL (ref 0.4–1.5)
GFR: 95.7 mL/min (ref >60.00)
GLUCOSE: 119 mg/dL — AB (ref 70–99)
POTASSIUM: 4.5 meq/L (ref 3.5–5.1)
Sodium: 135 mEq/L (ref 135–145)

## 2014-07-29 ENCOUNTER — Other Ambulatory Visit: Payer: Self-pay

## 2014-07-29 MED ORDER — LISINOPRIL 10 MG PO TABS: 10.0000 mg | ORAL_TABLET | Freq: Every day | ORAL | Status: DC

## 2014-08-08 ENCOUNTER — Other Ambulatory Visit: Payer: Self-pay

## 2014-09-15 ENCOUNTER — Encounter: Payer: Self-pay | Admitting: Internal Medicine

## 2014-09-26 ENCOUNTER — Other Ambulatory Visit: Payer: Self-pay | Admitting: Internal Medicine

## 2014-10-07 ENCOUNTER — Other Ambulatory Visit: Payer: Self-pay

## 2014-10-07 ENCOUNTER — Encounter: Payer: Self-pay | Admitting: Internal Medicine

## 2014-10-07 DIAGNOSIS — E785 Hyperlipidemia, unspecified: Secondary | ICD-10-CM

## 2014-10-07 MED ORDER — PRAVASTATIN SODIUM 40 MG PO TABS: ORAL_TABLET | ORAL | Status: DC

## 2014-10-29 ENCOUNTER — Encounter: Payer: Self-pay | Admitting: Internal Medicine

## 2014-10-29 ENCOUNTER — Ambulatory Visit (INDEPENDENT_AMBULATORY_CARE_PROVIDER_SITE_OTHER): Payer: Commercial Managed Care - HMO | Admitting: Internal Medicine

## 2014-10-29 VITALS — BP 152/84 | HR 68 | Temp 97.8°F | Ht 72.0 in | Wt 277.0 lb

## 2014-10-29 DIAGNOSIS — R7303 Prediabetes: Secondary | ICD-10-CM

## 2014-10-29 DIAGNOSIS — R7309 Other abnormal glucose: Secondary | ICD-10-CM

## 2014-10-29 DIAGNOSIS — I10 Essential (primary) hypertension: Secondary | ICD-10-CM

## 2014-10-29 MED ORDER — LISINOPRIL 20 MG PO TABS: 20.0000 mg | ORAL_TABLET | Freq: Every day | ORAL | Status: DC

## 2014-10-29 NOTE — Progress Notes (Signed)
Subjective:    Patient ID: Troy Garrison, male    DOB: 1941/05/30, 74 y.o.   MRN: [id number]  DOS:  10/29/2014 Type of visit - description : rov Interval history: Med list reviewed, good compliance, no apparent side effects Ambulatory BP consistently in the 140s/80. Diet is regular, has not been unable to exercise much due to hamstring pain, left side. Denies back pain per se, no lower extremity paresthesias.    ROS Denies chest pain, difficulty breathing. No lower extremity edema. No cough.   Past Medical History  Diagnosis Date  . Hemorrhoids   . Peptic ulcer 1964    with bleed ; transfused  . Osteoarthritis   . Left axis deviation 1999  . Hearing loss, neural     Left ear  . Injury of tendon of long head of biceps   . Atrial flutter     S/P RFCA with Dr. Caryl Comes 6/12  . GERD (gastroesophageal reflux disease)   . Hyperlipidemia   . Hyperglycemia   . Cancer     SKIN; BASAL & SQUAMOUS CELL  . History of Guillain-Barre syndrome 2003  . Prediabetes 07/02/2008    Qualifier: Diagnosis of  By: Linna Darner MD, Gwyndolyn Saxon   Random glucoses were 127 and 161 in November 2011      Past Surgical History  Procedure Laterality Date  . Vasectomy      Scar tissue removed 2 years after the vasectomy  . Replacement total knee  2000    Left Knee  . Colonoscopy with polypectomy  2011    Internal hemorrhoids; [facility]  . Ablation of dysrhythmic focus  2012     for AF;Dr Caryl Comes  . Total knee arthroplasty  09/21/2011    Procedure: TOTAL KNEE ARTHROPLASTY;  Surgeon: Tobi Bastos;  Location: [facility];  Service: Orthopedics;  Laterality: Right;  . Colonoscopy   2004 & 2014    negative    History   Social History  . Marital Status: Married    Spouse Name: N/A    Number of Children: 21  . Years of Education: N/A   Occupational History  . 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 Smoking status: Former Smoker    Quit date: 10/24/1992  . Smokeless tobacco: Never  Used     Comment: smoked 1960-1994, up to < 1 ppd  . Alcohol Use: Yes     Comment: wine  . Drug Use: No  . Sexual Activity: Not on file   Other Topics Concern  . Not on file   Social History Narrative        Medication List       This list is accurate as of: 10/29/14  7:35 PM.  Always use your most recent med list.               aspirin 325 MG EC tablet  Take 325 mg by mouth daily.     esomeprazole 40 MG capsule  Commonly known as:  NEXIUM  Take 40 mg by mouth daily as needed.     Fish Oil 500 MG Caps  Take 1,000 mg by mouth once.     ibuprofen 200 MG tablet  Commonly known as:  ADVIL,MOTRIN  Take 200 mg by mouth as needed.     lisinopril 20 MG tablet  Commonly known as:  PRINIVIL,ZESTRIL  Take 1 tablet (20 mg total) by mouth daily.     multivitamin tablet  Take 1 tablet by mouth daily.     pravastatin 40 MG tablet  Commonly known as:  PRAVACHOL  Take 1 tablet daily at bedtime           Objective:   Physical Exam BP 152/84 mmHg  Pulse 68  Temp(Src) 97.8 F (36.6 C) (Oral)  Ht 6' (1.829 m)  Wt 277 lb (125.646 kg)  BMI 37.56 kg/m2  SpO2 97% General -- alert, well-developed, NAD.  Lungs -- normal respiratory effort, no intercostal retractions, no accessory muscle use, and normal breath sounds.  Heart-- normal rate, regular rhythm, no murmur.  Back -- no TTP Extremities-- no pretibial edema bilaterally  Neurologic--  alert & oriented X3. Speech normal, gait appropriate for age, strength symmetric and appropriate for age.  DTRs symmetric. Psych-- Cognition and judgment appear intact. Cooperative with normal attention span and concentration. No anxious or depressed appearing.        Assessment & Plan:   Left leg pain, Pain at the left hamstring for 6 months, patient likes to give it a few more weeks, if not better he will call for a referral, either orthopedic surgery or physical therapy.

## 2014-10-29 NOTE — Patient Instructions (Signed)
Change lisinopril to 20 mg daily, a new prescription was sent  Continue checking your blood pressure twice a week, will try to get your blood pressure lower than 140.  Please come back in one month to check your potassium, simply call the lab appointment.  Next visit by 04-2015 for a physical exam

## 2014-10-29 NOTE — Assessment & Plan Note (Signed)
Prediabetes, A1c is stable, recheck on return to the office

## 2014-10-29 NOTE — Progress Notes (Signed)
Pre visit review using our clinic review tool, if applicable. No additional management support is needed unless otherwise documented below in the visit note. 

## 2014-10-29 NOTE — Assessment & Plan Note (Signed)
BP consistently in the 140s, used to be in the 120s, will attempt to lower his BP. Increase lisinopril to 20 mg, check a BMP in about a month, continue monitoring ambulatory BPs

## 2014-11-28 ENCOUNTER — Other Ambulatory Visit (INDEPENDENT_AMBULATORY_CARE_PROVIDER_SITE_OTHER): Payer: Commercial Managed Care - HMO

## 2014-11-28 DIAGNOSIS — I10 Essential (primary) hypertension: Secondary | ICD-10-CM

## 2014-11-28 LAB — BASIC METABOLIC PANEL
BUN: 19 mg/dL (ref 6–23)
CALCIUM: 9.6 mg/dL (ref 8.4–10.5)
CHLORIDE: 103 meq/L (ref 96–112)
CO2: 28 mEq/L (ref 19–32)
Creatinine, Ser: 0.95 mg/dL (ref 0.40–1.50)
GFR: 82.93 mL/min (ref >60.00)
Glucose, Bld: 91 mg/dL (ref 70–99)
Potassium: 4.8 mEq/L (ref 3.5–5.1)
Sodium: 137 mEq/L (ref 135–145)

## 2015-03-19 ENCOUNTER — Encounter: Payer: Self-pay | Admitting: Gastroenterology

## 2015-03-31 ENCOUNTER — Encounter: Payer: Self-pay | Admitting: Internal Medicine

## 2015-04-01 ENCOUNTER — Other Ambulatory Visit: Payer: Self-pay

## 2015-04-01 DIAGNOSIS — M25512 Pain in left shoulder: Secondary | ICD-10-CM

## 2015-04-16 ENCOUNTER — Telehealth: Payer: Self-pay | Admitting: Internal Medicine

## 2015-04-16 NOTE — Telephone Encounter (Signed)
Pre visit letter mailed 04/15/15 °

## 2015-04-20 ENCOUNTER — Other Ambulatory Visit: Payer: Self-pay

## 2015-05-05 ENCOUNTER — Encounter: Payer: Self-pay | Admitting: *Deleted

## 2015-05-05 ENCOUNTER — Telehealth: Payer: Self-pay | Admitting: *Deleted

## 2015-05-05 NOTE — Telephone Encounter (Signed)
Pre-Visit Call completed with patient and chart updated.   Pre-Visit Info documented in Specialty Comments under [employer].    

## 2015-05-06 ENCOUNTER — Encounter: Payer: Self-pay | Admitting: Internal Medicine

## 2015-05-06 ENCOUNTER — Ambulatory Visit (INDEPENDENT_AMBULATORY_CARE_PROVIDER_SITE_OTHER): Payer: Commercial Managed Care - HMO | Admitting: Internal Medicine

## 2015-05-06 VITALS — BP 122/66 | HR 65 | Temp 98.0°F | Ht 72.0 in | Wt 278.0 lb

## 2015-05-06 DIAGNOSIS — E785 Hyperlipidemia, unspecified: Secondary | ICD-10-CM

## 2015-05-06 DIAGNOSIS — I10 Essential (primary) hypertension: Secondary | ICD-10-CM | POA: Diagnosis not present

## 2015-05-06 DIAGNOSIS — N400 Enlarged prostate without lower urinary tract symptoms: Secondary | ICD-10-CM | POA: Diagnosis not present

## 2015-05-06 DIAGNOSIS — M150 Primary generalized (osteo)arthritis: Secondary | ICD-10-CM

## 2015-05-06 DIAGNOSIS — R7309 Other abnormal glucose: Secondary | ICD-10-CM

## 2015-05-06 DIAGNOSIS — Z0000 Encounter for general adult medical examination without abnormal findings: Secondary | ICD-10-CM | POA: Diagnosis not present

## 2015-05-06 DIAGNOSIS — Z85828 Personal history of other malignant neoplasm of skin: Secondary | ICD-10-CM

## 2015-05-06 DIAGNOSIS — I4892 Unspecified atrial flutter: Secondary | ICD-10-CM

## 2015-05-06 DIAGNOSIS — R7303 Prediabetes: Secondary | ICD-10-CM

## 2015-05-06 DIAGNOSIS — M159 Polyosteoarthritis, unspecified: Secondary | ICD-10-CM

## 2015-05-06 LAB — CBC WITH DIFFERENTIAL/PLATELET
BASOS PCT: 0.4 % (ref 0.0–3.0)
Basophils Absolute: 0 10*3/uL (ref 0.0–0.1)
EOS ABS: 0.1 10*3/uL (ref 0.0–0.7)
EOS PCT: 2 % (ref 0.0–5.0)
HEMATOCRIT: 42.6 % (ref 39.0–52.0)
Hemoglobin: 14.3 g/dL (ref 13.0–17.0)
LYMPHS PCT: 25.4 % (ref 12.0–46.0)
Lymphs Abs: 1.5 10*3/uL (ref 0.7–4.0)
MCHC: 33.5 g/dL (ref 30.0–36.0)
MCV: 92.7 fl (ref 78.0–100.0)
MONO ABS: 0.4 10*3/uL (ref 0.1–1.0)
MONOS PCT: 6.8 % (ref 3.0–12.0)
Neutro Abs: 3.9 10*3/uL (ref 1.4–7.7)
Neutrophils Relative %: 65.4 % (ref 43.0–77.0)
PLATELETS: 219 10*3/uL (ref 150.0–400.0)
RBC: 4.6 Mil/uL (ref 4.22–5.81)
RDW: 13.4 % (ref 11.5–15.5)
WBC: 6 10*3/uL (ref 4.0–10.5)

## 2015-05-06 LAB — LIPID PANEL
CHOL/HDL RATIO: 3
Cholesterol: 129 mg/dL (ref 0–200)
HDL: 37.9 mg/dL — ABNORMAL LOW (ref >39.00)
LDL Cholesterol: 80 mg/dL (ref 0–99)
NonHDL: 91.1
Triglycerides: 55 mg/dL (ref 0.0–149.0)
VLDL: 11 mg/dL (ref 0.0–40.0)

## 2015-05-06 LAB — BASIC METABOLIC PANEL
BUN: 17 mg/dL (ref 6–23)
CO2: 27 meq/L (ref 19–32)
Calcium: 9.5 mg/dL (ref 8.4–10.5)
Chloride: 103 mEq/L (ref 96–112)
Creatinine, Ser: 0.87 mg/dL (ref 0.40–1.50)
GFR: 91.68 mL/min (ref >60.00)
Glucose, Bld: 128 mg/dL — ABNORMAL HIGH (ref 70–99)
POTASSIUM: 4.9 meq/L (ref 3.5–5.1)
SODIUM: 139 meq/L (ref 135–145)

## 2015-05-06 LAB — AST: AST: 21 U/L (ref 0–37)

## 2015-05-06 LAB — HEMOGLOBIN A1C: Hgb A1c MFr Bld: 6.3 % (ref 4.6–6.5)

## 2015-05-06 LAB — ALT: ALT: 22 U/L (ref 0–53)

## 2015-05-06 LAB — PSA: PSA: 1.75 ng/mL (ref 0.10–4.00)

## 2015-05-06 MED ORDER — PRAVASTATIN SODIUM 40 MG PO TABS: 20.0000 mg | ORAL_TABLET | Freq: Every day | ORAL | Status: DC

## 2015-05-06 NOTE — Patient Instructions (Addendum)
Get your blood work before you leave      [place] cause injuries and can affect all age groups. It is possible to use preventive measures to significantly decrease the likelihood of falls. There are many simple measures which can make your home safer and prevent falls. OUTDOORS  Repair cracks and edges of walkways and driveways.  Remove high doorway thresholds.  Trim shrubbery on the main path into your home.  Have good outside lighting.  Clear walkways of tools, rocks, debris, and clutter.  Check that handrails are not broken and are securely fastened. Both sides of steps should have handrails.  Have leaves, snow, and ice cleared regularly.  Use sand or salt on walkways during winter months.  In the garage, clean up grease or oil spills. BATHROOM  Install night lights.  Install grab bars by the toilet and in the tub and shower.  Use non-skid mats or decals in the tub or shower.  Place a plastic non-slip stool in the shower to sit on, if needed.  Keep floors dry and clean up all water on the floor immediately.  Remove soap buildup in the tub or shower on a regular basis.  Secure bath mats with non-slip, double-sided rug tape.  Remove throw rugs and tripping hazards from the floors. BEDROOMS  Install night lights.  Make sure a bedside light is easy to reach.  Do not use oversized bedding.  Keep a telephone by your bedside.  Have a firm chair with side arms to use for getting dressed.  Remove throw rugs and tripping hazards from the floor. KITCHEN  Keep handles on pots and pans turned toward the center of the stove. Use back burners when possible.  Clean up spills quickly and allow time for drying.  Avoid walking on wet floors.  Avoid hot utensils and knives.  Position shelves so they are not too high or low.  Place commonly used objects within easy reach.  If necessary, use a sturdy step stool with a grab bar when  reaching.  Keep electrical cables out of the way.  Do not use floor polish or wax that makes floors slippery. If you must use wax, use non-skid floor wax.  Remove throw rugs and tripping hazards from the floor. STAIRWAYS  Never leave objects on stairs.  Place handrails on both sides of stairways and use them. Fix any loose handrails. Make sure handrails on both sides of the stairways are as long as the stairs.  Check carpeting to make sure it is firmly attached along stairs. Make repairs to worn or loose carpet promptly.  Avoid placing throw rugs at the top or bottom of stairways, or properly secure the rug with carpet tape to prevent slippage. Get rid of throw rugs, if possible.  Have an electrician put in a light switch at the top and bottom of the stairs. OTHER FALL PREVENTION TIPS  Wear low-heel or rubber-soled shoes that are supportive and fit well. Wear closed toe shoes.  When using a stepladder, make sure it is fully opened and both spreaders are firmly locked. Do not climb a closed stepladder.  Add color or contrast paint or tape to grab bars and handrails in your home. Place contrasting color strips on first and last steps.  Learn and use mobility aids as needed. Install an electrical emergency response system.  Turn on lights to avoid dark areas. Replace light bulbs that burn out immediately. Get light switches that glow.  Arrange furniture to create clear pathways. Keep furniture in the same place.  Firmly attach carpet with non-skid or double-sided tape.  Eliminate uneven floor surfaces.  Select a carpet pattern that does not visually hide the edge of steps.  Be aware of all pets. OTHER HOME SAFETY TIPS  Set the water temperature for 120 F (48.8 C).  Keep emergency numbers on or near the telephone.  Keep smoke detectors on every level of the home and near sleeping areas. Document Released: 09/30/2002 Document Revised: 04/10/2012 Document Reviewed:  12/30/2011 [facility] Patient Information 2015 [place], [place]. This information is not intended to replace advice given to you by your health care provider. Make sure you discuss any questions you have with your health care provider.   Preventive Care for Adults Ages 27 and over  Blood pressure check.** / Every 1 to 2 years.  Lipid and cholesterol check.**/ Every 5 years beginning at age 55.  Lung cancer screening. / Every year if you are aged 68-80 years and have a 30-pack-year history of smoking and currently smoke or have quit within the past 15 years. Yearly screening is stopped once you have quit smoking for at least 15 years or develop a health problem that would prevent you from having lung cancer treatment.  Fecal occult blood test (FOBT) of stool. / Every year beginning at age 67 and continuing until age 76. You may not have to do this test if you get a colonoscopy every 10 years.  Flexible sigmoidoscopy** or colonoscopy.** / Every 5 years for a flexible sigmoidoscopy or every 10 years for a colonoscopy beginning at age 56 and continuing until age 57.  Hepatitis C blood test.** / For all people born from [birth year] through 1963 and any individual with known risks for hepatitis C.  Abdominal aortic aneurysm (AAA) screening.** / A one-time screening for ages 87 to 26 years who are current or former smokers.  Skin self-exam. / Monthly.  Influenza vaccine. / Every year.  Tetanus, diphtheria, and acellular pertussis (Tdap/Td) vaccine.** / 1 dose of Td every 10 years.  Varicella vaccine.** / Consult your health care provider.  Zoster vaccine.** / 1 dose for adults aged 62 years or older.  Pneumococcal 13-valent conjugate (PCV13) vaccine.** / Consult your health care provider.  Pneumococcal polysaccharide (PPSV23) vaccine.** / 1 dose for all adults aged 43 years and older.  Meningococcal vaccine.** / Consult your health care provider.  Hepatitis A vaccine.** / Consult your health care  provider.  Hepatitis B vaccine.** / Consult your health care provider.  Haemophilus influenzae type b (Hib) vaccine.** / Consult your health care provider. **Family history and personal history of risk and conditions may change your health care provider's recommendations. Document Released: 12/06/2001 Document Revised: 10/15/2013 Document Reviewed: 03/07/2011 [facility] Patient Information 2015 [place], [place]. This information is not intended to replace advice given to you by your health care provider. Make sure you discuss any questions you have with your health care provider.

## 2015-05-06 NOTE — Assessment & Plan Note (Addendum)
History of a flutter, check an EKG today sinus rhythm, no acute changes. Patient is asymptomatic

## 2015-05-06 NOTE — Assessment & Plan Note (Addendum)
Tdap: 01/2006  PNA: Never   Shingles: Never Patient hesitant to get immunization due to history of Guillian barr, I agree. Current guidelines suggest the decision should be made "on individual basis".  CCS: 12/2012 noted  Niger Ink Tattoos, small internal hemmorhoids , next 5 years per report  Diet-exercise discussed

## 2015-05-06 NOTE — Assessment & Plan Note (Signed)
Reports self surveillance: No worrisome findings

## 2015-05-06 NOTE — Assessment & Plan Note (Signed)
Prostate gland minimally asymmetric, no  other worrisome features, patient is asymptomatic, check a PSA.

## 2015-05-06 NOTE — Assessment & Plan Note (Signed)
Status post a local injection shoulder, improved. He takes ibuprofen 200 mg 3 tablets most days, no side effects, GI precautions discussed

## 2015-05-06 NOTE — Progress Notes (Signed)
Pre visit review using our clinic review tool, if applicable. No additional management support is needed unless otherwise documented below in the visit note. 

## 2015-05-06 NOTE — Progress Notes (Signed)
Subjective:    Patient ID: Troy Garrison, male    DOB: [birth year], 74 y.o.   MRN: [id number]  DOS:  05/06/2015 Type of visit - description :  Here for Medicare AWV:  1. Risk factors based on Past M, S, F history: reviewed 2. Physical Activities: golf x3/week, farm work  3. Depression/mood: neg screening  4. Hearing:  Some problems, stable  5. ADL's: independent, drives  6. Fall Risk: no recent falls, prevention discussed , see AVS 7. home Safety: does feel safe at home  8. Height, weight, & visual acuity: see VS, sees eye doctor regulalrly 9. Counseling: provided 10. Labs ordered based on risk factors: if needed  11. Referral Coordination: if needed 12. Care Plan, see assessment and plan , written personalized plan provided , see AVS 13. Cognitive Assessment: motor skills and cognition appropriate for age 77. Care team updated, see Snap Shot  15. End-of-life care discussed , has a healthcare POA  In addition, today we discussed the following: Hypertension, good compliance of medication, ambulatory BPs in the 120/80 High cholesterol, on medications, no apparent side effects  Review of Systems  Constitutional: No fever. No chills. No unexplained wt changes. No unusual sweats  HEENT: No dental problems, no ear discharge, no facial swelling, no voice changes. No eye discharge, no eye  redness , no  intolerance to light   Respiratory: No wheezing , no  difficulty breathing. No cough , no mucus production  Cardiovascular: No CP, no leg swelling , no  Palpitations  GI: no nausea, no vomiting, no diarrhea , no  abdominal pain.  No blood in the stools. No dysphagia, no odynophagia    Endocrine: No polyphagia, no polyuria , no polydipsia  GU: No dysuria, gross hematuria, difficulty urinating. No urinary urgency, no frequency.  Musculoskeletal: Just had a shoulder injection a couple of weeks ago, pain improved. He does take ibuprofen 200 mg 3 tablets most days without apparent  side effects. GI precautions discussed  Skin: No change in the color of the skin, palor , no  Rash  Allergic, immunologic: No environmental allergies , no  food allergies  Neurological: No dizziness no  syncope. No headaches. No diplopia, no slurred, no slurred speech, no motor deficits, no facial  Numbness  Hematological: No enlarged lymph nodes, no easy bruising , no unusual bleedings  Psychiatry: No suicidal ideas, no hallucinations, no beavior problems, no confusion.  No unusual/severe anxiety, no depression   Past Medical History  Diagnosis Date  . Hemorrhoids   . Peptic ulcer 1964    with bleed ; transfused  . Osteoarthritis   . Left axis deviation 1999  . Hearing loss, neural     Left ear  . Injury of tendon of long head of biceps   . Atrial flutter     S/P RFCA with Dr. Caryl Comes 6/12  . GERD (gastroesophageal reflux disease)   . Hyperlipidemia   . Cancer     SKIN; BASAL & SQUAMOUS CELL  . History of Guillain-Barre syndrome 2003  . Prediabetes 07/02/2008    Past Surgical History  Procedure Laterality Date  . Vasectomy      Scar tissue removed 2 years after the vasectomy  . Replacement total knee  2000    Left Knee  . Colonoscopy with polypectomy  2011    Internal hemorrhoids; [facility]  . Ablation of dysrhythmic focus  2012     for AF;Dr Caryl Comes  . Total knee arthroplasty  09/21/2011  Procedure: TOTAL KNEE ARTHROPLASTY;  Surgeon: Tobi Bastos;  Location: [facility];  Service: Orthopedics;  Laterality: Right;  . Colonoscopy   2004 & 2014    negative    History   Social History  . Marital Status: Married    Spouse Name: N/A  . Number of Children: 5  . Years of Education: N/A   Occupational History  . 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 Smoking status: Former Smoker    Quit date: 10/24/1992  . Smokeless tobacco: Never Used     Comment: smoked 1960-1994, up to < 1 ppd  . Alcohol Use: Yes     Comment: wine  . Drug Use: No  .  Sexual Activity: Not on file   Other Topics Concern  . Not on file   Social History Narrative   10 grandchildren      Family History  Problem Relation Age of Onset  . Heart attack Father 32  . Heart failure Father     Died @ 41  . Lupus Mother   . Heart attack Brother 29    MI @ 72;.CABG @ 17  . Diabetes Brother   . Prostate cancer Paternal Grandfather     11  . Cancer Maternal Uncle     X2, unknown primary  . Stroke Maternal Grandmother     > 65  . Peripheral vascular disease Brother     S/P leg amputation  . Colon cancer Neg Hx   . Stomach cancer Neg Hx        Medication List       This list is accurate as of: 05/06/15  5:14 PM.  Always use your most recent med list.               aspirin 325 MG EC tablet  Take 325 mg by mouth daily.     esomeprazole 40 MG capsule  Commonly known as:  NEXIUM  Take 40 mg by mouth daily as needed.     Fish Oil 500 MG Caps  Take 1,000 mg by mouth once.     ibuprofen 200 MG tablet  Commonly known as:  ADVIL,MOTRIN  Take 200 mg by mouth as needed.     lisinopril 20 MG tablet  Commonly known as:  PRINIVIL,ZESTRIL  Take 1 tablet (20 mg total) by mouth daily.     Melatonin 10 MG Caps  Take 1 capsule by mouth daily.     multivitamin tablet  Take 1 tablet by mouth daily.     pravastatin 40 MG tablet  Commonly known as:  PRAVACHOL  Take 0.5 tablets (20 mg total) by mouth daily.           Objective:   Physical Exam BP 122/66 mmHg  Pulse 65  Temp(Src) 98 F (36.7 C) (Oral)  Ht 6' (1.829 m)  Wt 278 lb (126.1 kg)  BMI 37.70 kg/m2  SpO2 96%  General:   Well developed, well nourished . NAD.  Neck:  Full range of motion. Supple. No  thyromegaly   HEENT:  Normocephalic . Face symmetric, atraumatic Lungs:  CTA B Normal respiratory effort, no intercostal retractions, no accessory muscle use. Heart: RRR,  no murmur.  No pretibial edema bilaterally  Abdomen:  Not distended, soft, non-tender. No rebound or  rigidity. No bruit Rectal:  External abnormalities: none. Normal sphincter tone. No rectal masses or tenderness.  Stool brown  Prostate: Prostate gland firm  and smooth, left side seems slightly enlarged but there is no nodularity or tenderness. Exam is similar to last year.  Skin: Exposed areas without rash. Not pale. Not jaundice Neurologic:  alert & oriented X3.  Speech normal, gait appropriate for age and unassisted Strength symmetric and appropriate for age.  Psych: Cognition and judgment appear intact.  Cooperative with normal attention span and concentration.  Behavior appropriate. No anxious or depressed appearing.       Assessment & Plan:     Hypertension: Continue lisinopril, check a BMP  Prediabetes: Check A1c, diet and exercise discussed  Hyperlipidemia: Good compliance with meds, check labs

## 2015-11-23 DIAGNOSIS — M25512 Pain in left shoulder: Secondary | ICD-10-CM | POA: Diagnosis not present

## 2015-12-16 ENCOUNTER — Encounter: Payer: Self-pay | Admitting: Internal Medicine

## 2015-12-16 MED ORDER — LISINOPRIL 20 MG PO TABS: 20.0000 mg | ORAL_TABLET | Freq: Every day | ORAL | Status: DC

## 2015-12-16 NOTE — Telephone Encounter (Signed)
Rx sent 

## 2015-12-24 ENCOUNTER — Encounter: Payer: Self-pay | Admitting: Internal Medicine

## 2015-12-24 NOTE — Telephone Encounter (Signed)
-----   Message from Colon Branch, MD sent at 12/24/2015  1:19 PM EST ----- Regarding: Troy Garrison, call pt ASAP please  needs an appointment for today-tomorrow, acute, dx URI if unable to be seen here, needs UC

## 2015-12-24 NOTE — Telephone Encounter (Signed)
Patient scheduled for 12/25/15 at 9:30am

## 2015-12-24 NOTE — Telephone Encounter (Signed)
LM for pt to call and schedule acute appt asap today or tomorrow morning

## 2015-12-25 ENCOUNTER — Encounter: Payer: Self-pay | Admitting: Internal Medicine

## 2015-12-25 ENCOUNTER — Ambulatory Visit (INDEPENDENT_AMBULATORY_CARE_PROVIDER_SITE_OTHER): Payer: PPO | Admitting: Internal Medicine

## 2015-12-25 VITALS — BP 124/72 | HR 81 | Temp 98.1°F | Ht 72.0 in | Wt 274.2 lb

## 2015-12-25 DIAGNOSIS — I10 Essential (primary) hypertension: Secondary | ICD-10-CM | POA: Diagnosis not present

## 2015-12-25 DIAGNOSIS — Z09 Encounter for follow-up examination after completed treatment for conditions other than malignant neoplasm: Secondary | ICD-10-CM

## 2015-12-25 DIAGNOSIS — R739 Hyperglycemia, unspecified: Secondary | ICD-10-CM

## 2015-12-25 DIAGNOSIS — J209 Acute bronchitis, unspecified: Secondary | ICD-10-CM

## 2015-12-25 LAB — BASIC METABOLIC PANEL
BUN: 17 mg/dL (ref 6–23)
CO2: 24 meq/L (ref 19–32)
Calcium: 9.7 mg/dL (ref 8.4–10.5)
Chloride: 100 mEq/L (ref 96–112)
Creatinine, Ser: 0.85 mg/dL (ref 0.40–1.50)
GFR: 94 mL/min (ref >60.00)
GLUCOSE: 122 mg/dL — AB (ref 70–99)
POTASSIUM: 4.6 meq/L (ref 3.5–5.1)
SODIUM: 133 meq/L — AB (ref 135–145)

## 2015-12-25 LAB — HEMOGLOBIN A1C: HEMOGLOBIN A1C: 6.7 % — AB (ref 4.6–6.5)

## 2015-12-25 MED ORDER — AZITHROMYCIN 250 MG PO TABS: ORAL_TABLET | ORAL | Status: DC

## 2015-12-25 NOTE — Patient Instructions (Signed)
GO TO THE LAB : Get the blood work    GO TO THE FRONT DESK  Schedule a complete physical exam to be done by 04-2016 Please be fasting   ----- Rest, fluids , tylenol  For cough:  Take Mucinex DM twice a day as needed until better  For nasal congestion: Use OTC Nasocort or Flonase : 2 nasal sprays on each side of the nose in the morning until you feel better   Avoid decongestants such as  Pseudoephedrine or phenylephrine     Take the antibiotic as prescribed  (zithromax )  Call if not gradually better over the next  10 days  Call anytime if the symptoms are severe

## 2015-12-25 NOTE — Progress Notes (Signed)
Subjective:    Patient ID: Troy Garrison, male    DOB: 10/09/1941, 75 y.o.   MRN: [id number]  DOS:  12/25/2015 Type of visit - description : Acute visit Interval history: Symptoms started 2 weeks ago, he felt "terrible", had cough, chest congestion, taking OTC Mucinex without much help.  he feels now better but has a persisting cough with occ yellow sputum  production  We also discussed the following: HTN: good med compliance,  ambulatory BPs when checked was normal Prediabetes: Doing well with exercise except for the last 2 weeks, due to resp sx .  Review of Systems  No fever, occasional chills. Mild sinus congestion and postnasal dripping. Has some nausea and dry heaves the first week of the illness No wheezing. No rash.  Past Medical History  Diagnosis Date  . Hemorrhoids   . Peptic ulcer 1964    with bleed ; transfused  . Osteoarthritis   . Left axis deviation 1999  . Hearing loss, neural     Left ear  . Injury of tendon of long head of biceps   . Atrial flutter ([place])     S/P RFCA with Dr. Caryl Comes 6/12  . GERD (gastroesophageal reflux disease)   . Hyperlipidemia   . Cancer (HCC)     SKIN; BASAL & SQUAMOUS CELL  . History of Guillain-Barre syndrome 2003  . Prediabetes 07/02/2008  . Bursitis of shoulder     Past Surgical History  Procedure Laterality Date  . Vasectomy      Scar tissue removed 2 years after the vasectomy  . Replacement total knee  2000    Left Knee  . Colonoscopy with polypectomy  2011    Internal hemorrhoids; [facility]  . Ablation of dysrhythmic focus  2012     for AF;Dr Caryl Comes  . Total knee arthroplasty  09/21/2011    Procedure: TOTAL KNEE ARTHROPLASTY;  Surgeon: Tobi Bastos;  Location: [facility];  Service: Orthopedics;  Laterality: Right;  . Colonoscopy   2004 & 2014    negative    Social History   Social History  . Marital Status: Married    Spouse Name: N/A  . Number of Children: 5  . Years of Education: N/A   Occupational  History  . 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 Smoking status: Former Smoker    Quit date: 10/24/1992  . Smokeless tobacco: Never Used     Comment: smoked 1960-1994, up to < 1 ppd  . Alcohol Use: Yes     Comment: wine  . Drug Use: No  . Sexual Activity: Not on file   Other Topics Concern  . Not on file   Social History Narrative   10 grandchildren         Medication List       This list is accurate as of: 12/25/15 11:59 PM.  Always use your most recent med list.               aspirin 325 MG EC tablet  Take 325 mg by mouth daily.     azithromycin 250 MG tablet  Commonly known as:  ZITHROMAX Z-PAK  2 tabs a day the first day, then 1 tab a day x 4 days     esomeprazole 40 MG capsule  Commonly known as:  NEXIUM  Take 40 mg by mouth daily as needed.     Fish Oil 500 MG Caps  Take 1,000 mg by mouth once.     ibuprofen 200 MG tablet  Commonly known as:  ADVIL,MOTRIN  Take 200 mg by mouth as needed.     lisinopril 20 MG tablet  Commonly known as:  PRINIVIL,ZESTRIL  Take 1 tablet (20 mg total) by mouth daily.     Melatonin 10 MG Caps  Take 1 capsule by mouth daily.     multivitamin tablet  Take 1 tablet by mouth daily.     pravastatin 40 MG tablet  Commonly known as:  PRAVACHOL  Take 0.5 tablets (20 mg total) by mouth daily.           Objective:   Physical Exam BP 124/72 mmHg  Pulse 81  Temp(Src) 98.1 F (36.7 C) (Oral)  Ht 6' (1.829 m)  Wt 274 lb 4 oz (124.399 kg)  BMI 37.19 kg/m2  SpO2 96% General:   Well developed, well nourished . NAD.  HEENT:  Normocephalic . Face symmetric, atraumatic TM: obscured by dry wax, Throat wnl Nose congested, sinuses not TTP Lungs:  CTA B except for few ronchi Normal respiratory effort, no intercostal retractions, no accessory muscle use. Heart: RRR,  no murmur.  No pretibial edema bilaterally  Skin: Not pale. Not jaundice Neurologic:  alert & oriented X3.  Speech normal, gait  appropriate for age and unassisted Psych--  Cognition and judgment appear intact.  Cooperative with normal attention span and concentration.  Behavior appropriate. No anxious or depressed appearing.      Assessment & Plan:   Assessment Prediabetes 2009 HTN Hyperlipidemia GERD DJD Atrial fibrillation s/p RFCA---Dr. Caryl Comes 2012 Skin cancer: BCC, SCC Guillain-Barre syndrome 2003 L HOH H/o Peptic ulcer 1964  Plan: Bronchitis: Z-Pak, see instructions Prediabetes: Check A1c slightly  HTN: Check a BMP RTC, CPX, 04-2016

## 2015-12-25 NOTE — Progress Notes (Signed)
Pre visit review using our clinic review tool, if applicable. No additional management support is needed unless otherwise documented below in the visit note. 

## 2015-12-27 DIAGNOSIS — Z09 Encounter for follow-up examination after completed treatment for conditions other than malignant neoplasm: Secondary | ICD-10-CM | POA: Insufficient documentation

## 2015-12-27 NOTE — Assessment & Plan Note (Signed)
Bronchitis: Z-Pak, see instructions Prediabetes: Check A1c slightly  HTN: Check a BMP RTC, CPX, 04-2016

## 2016-03-28 ENCOUNTER — Telehealth: Payer: Self-pay | Admitting: Internal Medicine

## 2016-03-28 MED ORDER — LISINOPRIL 20 MG PO TABS: 20.0000 mg | ORAL_TABLET | Freq: Every day | ORAL | Status: DC

## 2016-03-28 NOTE — Telephone Encounter (Signed)
Rx sent 

## 2016-03-28 NOTE — Telephone Encounter (Signed)
Can be reached: [phone] Pharmacy: [facility], [place]  Reason for call: pt will need lisinopril prior to cpe (scheduled for 05/17/16). He has 2 weeks on hand.

## 2016-05-04 ENCOUNTER — Telehealth: Payer: Self-pay | Admitting: Internal Medicine

## 2016-05-10 NOTE — Telephone Encounter (Signed)
Completed.

## 2016-05-17 ENCOUNTER — Encounter: Payer: PPO | Admitting: Internal Medicine

## 2016-06-08 DIAGNOSIS — J343 Hypertrophy of nasal turbinates: Secondary | ICD-10-CM | POA: Diagnosis not present

## 2016-06-08 DIAGNOSIS — H6123 Impacted cerumen, bilateral: Secondary | ICD-10-CM | POA: Diagnosis not present

## 2016-06-08 DIAGNOSIS — H9042 Sensorineural hearing loss, unilateral, left ear, with unrestricted hearing on the contralateral side: Secondary | ICD-10-CM | POA: Diagnosis not present

## 2016-06-08 DIAGNOSIS — H9312 Tinnitus, left ear: Secondary | ICD-10-CM | POA: Diagnosis not present

## 2016-06-08 DIAGNOSIS — R42 Dizziness and giddiness: Secondary | ICD-10-CM | POA: Diagnosis not present

## 2016-07-02 ENCOUNTER — Other Ambulatory Visit: Payer: Self-pay | Admitting: Internal Medicine

## 2016-07-04 MED ORDER — LISINOPRIL 20 MG PO TABS: 20.0000 mg | ORAL_TABLET | Freq: Every day | ORAL | 1 refills | Status: DC

## 2016-07-16 ENCOUNTER — Encounter: Payer: Self-pay | Admitting: Internal Medicine

## 2016-07-16 DIAGNOSIS — E785 Hyperlipidemia, unspecified: Secondary | ICD-10-CM

## 2016-07-16 DIAGNOSIS — R739 Hyperglycemia, unspecified: Secondary | ICD-10-CM

## 2016-07-16 DIAGNOSIS — I10 Essential (primary) hypertension: Secondary | ICD-10-CM

## 2016-07-18 ENCOUNTER — Other Ambulatory Visit (INDEPENDENT_AMBULATORY_CARE_PROVIDER_SITE_OTHER): Payer: PPO

## 2016-07-18 DIAGNOSIS — E785 Hyperlipidemia, unspecified: Secondary | ICD-10-CM | POA: Diagnosis not present

## 2016-07-18 DIAGNOSIS — I10 Essential (primary) hypertension: Secondary | ICD-10-CM | POA: Diagnosis not present

## 2016-07-18 DIAGNOSIS — R739 Hyperglycemia, unspecified: Secondary | ICD-10-CM | POA: Diagnosis not present

## 2016-07-18 NOTE — Telephone Encounter (Signed)
See patient's message:  Labs:  BMP- CBC---------- HTN  FLP, AST, ALT-------------- high cholesterol  A1c------------prediabetes     JP

## 2016-07-19 LAB — BASIC METABOLIC PANEL
BUN: 18 mg/dL (ref 6–23)
CALCIUM: 9.2 mg/dL (ref 8.4–10.5)
CO2: 26 meq/L (ref 19–32)
CREATININE: 0.92 mg/dL (ref 0.40–1.50)
Chloride: 103 mEq/L (ref 96–112)
GFR: 85.66 mL/min (ref >60.00)
GLUCOSE: 100 mg/dL — AB (ref 70–99)
POTASSIUM: 4.6 meq/L (ref 3.5–5.1)
Sodium: 137 mEq/L (ref 135–145)

## 2016-07-19 LAB — CBC WITH DIFFERENTIAL/PLATELET
Basophils Absolute: 0 10*3/uL (ref 0.0–0.1)
Basophils Relative: 0.3 % (ref 0.0–3.0)
EOS PCT: 2.8 % (ref 0.0–5.0)
Eosinophils Absolute: 0.2 10*3/uL (ref 0.0–0.7)
HCT: 42.8 % (ref 39.0–52.0)
HEMOGLOBIN: 14.7 g/dL (ref 13.0–17.0)
LYMPHS ABS: 1.9 10*3/uL (ref 0.7–4.0)
Lymphocytes Relative: 23.3 % (ref 12.0–46.0)
MCHC: 34.2 g/dL (ref 30.0–36.0)
MCV: 90.7 fl (ref 78.0–100.0)
MONOS PCT: 7 % (ref 3.0–12.0)
Monocytes Absolute: 0.6 10*3/uL (ref 0.1–1.0)
Neutro Abs: 5.3 10*3/uL (ref 1.4–7.7)
Neutrophils Relative %: 66.6 % (ref 43.0–77.0)
Platelets: 223 10*3/uL (ref 150.0–400.0)
RBC: 4.73 Mil/uL (ref 4.22–5.81)
RDW: 13.9 % (ref 11.5–15.5)
WBC: 8 10*3/uL (ref 4.0–10.5)

## 2016-07-19 LAB — LIPID PANEL
Cholesterol: 129 mg/dL (ref 0–200)
HDL: 35.4 mg/dL — ABNORMAL LOW (ref >39.00)
LDL CALC: 71 mg/dL (ref 0–99)
NONHDL: 93.53
Total CHOL/HDL Ratio: 4
Triglycerides: 111 mg/dL (ref 0.0–149.0)
VLDL: 22.2 mg/dL (ref 0.0–40.0)

## 2016-07-19 LAB — ALT: ALT: 22 U/L (ref 0–53)

## 2016-07-19 LAB — AST: AST: 23 U/L (ref 0–37)

## 2016-07-19 LAB — HEMOGLOBIN A1C: Hgb A1c MFr Bld: 6.3 % (ref 4.6–6.5)

## 2016-07-20 ENCOUNTER — Ambulatory Visit (INDEPENDENT_AMBULATORY_CARE_PROVIDER_SITE_OTHER): Payer: PPO | Admitting: Internal Medicine

## 2016-07-20 ENCOUNTER — Encounter: Payer: Self-pay | Admitting: Internal Medicine

## 2016-07-20 VITALS — BP 128/78 | HR 94 | Temp 98.0°F | Resp 14 | Ht 72.0 in | Wt 275.0 lb

## 2016-07-20 DIAGNOSIS — Z0000 Encounter for general adult medical examination without abnormal findings: Secondary | ICD-10-CM | POA: Diagnosis not present

## 2016-07-20 MED ORDER — LISINOPRIL 20 MG PO TABS: 20.0000 mg | ORAL_TABLET | Freq: Every day | ORAL | 3 refills | Status: DC

## 2016-07-20 MED ORDER — PRAVASTATIN SODIUM 40 MG PO TABS: 20.0000 mg | ORAL_TABLET | Freq: Every day | ORAL | 3 refills | Status: DC

## 2016-07-20 NOTE — Progress Notes (Signed)
Pre visit review using our clinic review tool, if applicable. No additional management support is needed unless otherwise documented below in the visit note. 

## 2016-07-20 NOTE — Assessment & Plan Note (Signed)
Tdap: 01/2006 ; PNA: Never  ; Shingles: Never Patient hesitant to get immunization due to history of Guillian barr, I agree. Current guidelines suggest the decision should be made "on individual basis".  CCS: 12/2012 noted  Niger Ink Tattoos, small internal hemmorhoids , next 5 years per report DRE and PSA 2016 negative/normal.  Diet-exercise discussed , he is doing very well

## 2016-07-20 NOTE — Progress Notes (Signed)
Subjective:    Patient ID: Troy Garrison, male    DOB: 04/21/1941, 75 y.o.   MRN: [id number]  DOS:  07/20/2016 Type of visit - description :  Complete physical exam Interval history: In general feeling well, diet and exercise have improved. Medications reviewed, good compliance. Recent labs reviewed with the patient as well.     Review of Systems Constitutional: No fever. No chills. No unexplained wt changes. No unusual sweats  HEENT: No dental problems, no ear discharge, no facial swelling, no voice changes. No eye discharge, no eye  redness , no  intolerance to light   Respiratory: No wheezing , no  difficulty breathing. On and off cough for years, over the last year has noticed the mucus to be more abundant, no wheezing, no hemoptysis. He is a former smoker but quit more than 25 years ago.  Cardiovascular: No CP, no leg swelling , no  Palpitations  GI: no nausea, no vomiting, no diarrhea , no  abdominal pain.  No blood in the stools. No dysphagia, no odynophagia    Endocrine: No polyphagia, no polyuria , no polydipsia  GU: No dysuria, gross hematuria, difficulty urinating. No urinary urgency, no frequency. 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 no  syncope. No headaches. No diplopia, no slurred, no slurred speech, no motor deficits, no facial  Numbness  Hematological: No enlarged lymph nodes, no easy bruising , no unusual bleedings  Psychiatry: No suicidal ideas, no hallucinations, no beavior problems, no confusion.    No unusual/severe anxiety, no depression    Past Medical History:  Diagnosis Date  . Atrial flutter ([place])    S/P RFCA with Dr. Caryl Comes 6/12  . Bursitis of shoulder   . Cancer (HCC)    SKIN; BASAL & SQUAMOUS CELL  . GERD (gastroesophageal reflux disease)   . Hearing loss, neural    Left ear  . Hemorrhoids   . History of Guillain-Barre syndrome 2003  . Hyperlipidemia   . Injury of tendon of long head of biceps   . Left axis deviation 1999  . Osteoarthritis   . Peptic ulcer 1964   with bleed ; transfused  . Prediabetes 07/02/2008    Past Surgical History:  Procedure Laterality Date  . ABLATION OF DYSRHYTHMIC FOCUS  2012    for AF;Dr Caryl Comes  . colonoscopy with polypectomy  2011   Internal hemorrhoids; [facility]  . REPLACEMENT TOTAL KNEE  2000   Left Knee  . TOTAL KNEE ARTHROPLASTY  09/21/2011   Procedure: TOTAL KNEE ARTHROPLASTY;  Surgeon: Tobi Bastos;  Location: [facility];  Service: Orthopedics;  Laterality: Right;  [facility] VASECTOMY     Scar tissue removed 2 years after the vasectomy    Social History   Social History  . Marital status: Married    Spouse name: N/A  . Number of children: 5  . Years of education: N/A   Occupational History  . 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  Smoking status: Former Smoker    Quit date: 10/24/1992  . Smokeless tobacco: Never Used     Comment: smoked 1960-1994, up to < 1 ppd  . Alcohol use Yes     Comment: wine  . Drug use: No  . Sexual activity: Not on file   Other Topics Concern  . Not on file   Social History Narrative   10 grandchildren    Family History  Problem Relation Age of Onset  . Heart attack Father 57  . Heart failure Father     Died @ 26  . Lupus Mother   . Heart attack Brother 59    MI @ 27;.CABG @ 6  . Diabetes Brother   . Prostate cancer Paternal Grandfather     73  . Cancer Maternal Uncle     X2, unknown primary  . Stroke Maternal Grandmother     > 65  . Peripheral  vascular disease Brother     S/P leg amputation  . Colon cancer Neg Hx   . Stomach cancer Neg Hx         Medication List       Accurate as of 07/20/16  3:21 PM. Always use your most recent med list.          aspirin 325 MG EC tablet Take 325 mg by mouth daily.   azithromycin 250 MG tablet Commonly known as:  ZITHROMAX Z-PAK 2 tabs a day the first day, then 1 tab a day x 4 days   esomeprazole 40 MG capsule Commonly known as:  NEXIUM Take 40 mg by mouth daily as needed.   Fish Oil 500 MG Caps Take 1,000 mg by mouth once.   ibuprofen 200 MG tablet Commonly known as:  ADVIL,MOTRIN Take 200 mg by mouth as needed.   lisinopril 20 MG tablet Commonly known as:  PRINIVIL,ZESTRIL Take 1 tablet (20 mg total) by mouth daily.   Melatonin 10 MG Caps Take 1 capsule by mouth daily.   multivitamin tablet Take 1 tablet by mouth daily.   pravastatin 40 MG tablet Commonly known as:  PRAVACHOL Take 0.5 tablets (20 mg total) by mouth daily.          Objective:   Physical Exam BP 128/78 (BP Location: Left Arm, Patient Position: Sitting, Cuff Size: Normal)   Pulse 94   Temp 98 F (36.7 C) (Oral)   Resp 14   Ht 6' (1.829 m)   Wt 275 lb (124.7 kg)   SpO2 97%   BMI 37.30 kg/m   General:   Well developed, well nourished . NAD.  Neck: No  thyromegaly  HEENT:  Normocephalic . Face symmetric, atraumatic Lungs:  CTA B Normal respiratory effort, no intercostal retractions, no accessory muscle use. Heart: RRR,  no murmur.  No pretibial edema bilaterally  Abdomen:  Not distended, soft, non-tender. No rebound or rigidity.   Skin: Exposed areas without rash. Not pale. Not jaundice Neurologic:  alert & oriented X3.  Speech normal, gait appropriate for age and unassisted Strength symmetric and appropriate for age.  Psych: Cognition and judgment appear intact.  Cooperative with normal attention span and concentration.  Behavior appropriate. No anxious or depressed  appearing.    Assessment & Plan:   Assessment Prediabetes 2009 HTN Hyperlipidemia GERD DJD Atrial fibrillation s/p RFCA---Dr. Klein 2012 Skin cancer: BCC, SCC Guillain-Barre syndrome 2003 L HOH H/o Peptic ulcer 1964  PLAN: Prediabetes: Has improved his lifestyle, last A1c excellent HTN: Continue with lisinopril,  BP controlled. Recent BMP satisfactory. High cholesterol: Under excellent control with Pravachol. See last FLP Cough: Chronic, quit tobacco more than 25 years ago, declined further eval with a chest x-ray. DJD: Takes ibuprofen sporadically, not daily. Left biceps tear? See physical exam, recommend observation for now. RTC one year

## 2016-07-20 NOTE — Patient Instructions (Signed)
Next visit in 1 year  Consider a healthcare power of attorney    Fall Prevention and Home Safety Falls cause injuries and can affect all age groups. It is possible to use preventive measures to significantly decrease the likelihood of falls. There are many simple measures which can make your home safer and prevent falls. OUTDOORS  Repair cracks and edges of walkways and driveways.  Remove high doorway thresholds.  Trim shrubbery on the main path into your home.  Have good outside lighting.  Clear walkways of tools, rocks, debris, and clutter.  Check that handrails are not broken and are securely fastened. Both sides of steps should have handrails.  Have leaves, snow, and ice cleared regularly.  Use sand or salt on walkways during winter months.  In the garage, clean up grease or oil spills. BATHROOM  Install night lights.  Install grab bars by the toilet and in the tub and shower.  Use non-skid mats or decals in the tub or shower.  Place a plastic non-slip stool in the shower to sit on, if needed.  Keep floors dry and clean up all water on the floor immediately.  Remove soap buildup in the tub or shower on a regular basis.  Secure bath mats with non-slip, double-sided rug tape.  Remove throw rugs and tripping hazards from the floors. BEDROOMS  Install night lights.  Make sure a bedside light is easy to reach.  Do not use oversized bedding.  Keep a telephone by your bedside.  Have a firm chair with side arms to use for getting dressed.  Remove throw rugs and tripping hazards from the floor. KITCHEN  Keep handles on pots and pans turned toward the center of the stove. Use back burners when possible.  Clean up spills quickly and allow time for drying.  Avoid walking on wet floors.  Avoid hot utensils and knives.  Position shelves so they are not too high or low.  Place commonly used objects within easy reach.  If necessary, use a sturdy step stool  with a grab bar when reaching.  Keep electrical cables out of the way.  Do not use floor polish or wax that makes floors slippery. If you must use wax, use non-skid floor wax.  Remove throw rugs and tripping hazards from the floor. STAIRWAYS  Never leave objects on stairs.  Place handrails on both sides of stairways and use them. Fix any loose handrails. Make sure handrails on both sides of the stairways are as long as the stairs.  Check carpeting to make sure it is firmly attached along stairs. Make repairs to worn or loose carpet promptly.  Avoid placing throw rugs at the top or bottom of stairways, or properly secure the rug with carpet tape to prevent slippage. Get rid of throw rugs, if possible.  Have an electrician put in a light switch at the top and bottom of the stairs. OTHER FALL PREVENTION TIPS  Wear low-heel or rubber-soled shoes that are supportive and fit well. Wear closed toe shoes.  When using a stepladder, make sure it is fully opened and both spreaders are firmly locked. Do not climb a closed stepladder.  Add color or contrast paint or tape to grab bars and handrails in your home. Place contrasting color strips on first and last steps.  Learn and use mobility aids as needed. Install an electrical emergency response system.  Turn on lights to avoid dark areas. Replace light bulbs that burn out immediately. Get light switches  that glow.  Arrange furniture to create clear pathways. Keep furniture in the same place.  Firmly attach carpet with non-skid or double-sided tape.  Eliminate uneven floor surfaces.  Select a carpet pattern that does not visually hide the edge of steps.  Be aware of all pets. OTHER HOME SAFETY TIPS  Set the water temperature for 120 F (48.8 C).  Keep emergency numbers on or near the telephone.  Keep smoke detectors on every level of the home and near sleeping areas. Document Released: 09/30/2002 Document Revised: 04/10/2012  Document Reviewed: 12/30/2011 [facility] Patient Information 2015 [place], [place]. This information is not intended to replace advice given to you by your health care provider. Make sure you discuss any questions you have with your health care provider.   Preventive Care for Adults Ages 47 and over  Blood pressure check.** / Every 1 to 2 years.  Lipid and cholesterol check.**/ Every 5 years beginning at age 32.  Lung cancer screening. / Every year if you are aged 53-80 years and have a 30-pack-year history of smoking and currently smoke or have quit within the past 15 years. Yearly screening is stopped once you have quit smoking for at least 15 years or develop a health problem that would prevent you from having lung cancer treatment.  Fecal occult blood test (FOBT) of stool. / Every year beginning at age 85 and continuing until age 19. You may not have to do this test if you get a colonoscopy every 10 years.  Flexible sigmoidoscopy** or colonoscopy.** / Every 5 years for a flexible sigmoidoscopy or every 10 years for a colonoscopy beginning at age 88 and continuing until age 26.  Hepatitis C blood test.** / For all people born from [birth year] through 1963 and any individual with known risks for hepatitis C.  Abdominal aortic aneurysm (AAA) screening.** / A one-time screening for ages 33 to 71 years who are current or former smokers.  Skin self-exam. / Monthly.  Influenza vaccine. / Every year.  Tetanus, diphtheria, and acellular pertussis (Tdap/Td) vaccine.** / 1 dose of Td every 10 years.  Varicella vaccine.** / Consult your health care provider.  Zoster vaccine.** / 1 dose for adults aged 62 years or older.  Pneumococcal 13-valent conjugate (PCV13) vaccine.** / Consult your health care provider.  Pneumococcal polysaccharide (PPSV23) vaccine.** / 1 dose for all adults aged 39 years and older.  Meningococcal vaccine.** / Consult your health care provider.  Hepatitis A vaccine.** /  Consult your health care provider.  Hepatitis B vaccine.** / Consult your health care provider.  Haemophilus influenzae type b (Hib) vaccine.** / Consult your health care provider. **Family history and personal history of risk and conditions may change your health care provider's recommendations. Document Released: 12/06/2001 Document Revised: 10/15/2013 Document Reviewed: 03/07/2011 [facility] Patient Information 2015 [place], [place]. This information is not intended to replace advice given to you by your health care provider. Make sure you discuss any questions you have with your health care provider.

## 2016-07-21 NOTE — Assessment & Plan Note (Signed)
Prediabetes: Has improved his lifestyle, last A1c excellent HTN: Continue with lisinopril, BP controlled. Recent BMP satisfactory. High cholesterol: Under excellent control with Pravachol. See last FLP Cough: Chronic, quit tobacco more than 25 years ago, declined further eval with a chest x-ray. DJD: Takes ibuprofen sporadically, not daily. Left biceps tear? See physical exam, recommend observation for now. RTC one year

## 2016-09-29 DIAGNOSIS — S4431XA Injury of axillary nerve, right arm, initial encounter: Secondary | ICD-10-CM | POA: Diagnosis not present

## 2016-09-29 DIAGNOSIS — M25511 Pain in right shoulder: Secondary | ICD-10-CM | POA: Diagnosis not present

## 2016-10-06 DIAGNOSIS — Z85828 Personal history of other malignant neoplasm of skin: Secondary | ICD-10-CM | POA: Diagnosis not present

## 2016-10-06 DIAGNOSIS — L570 Actinic keratosis: Secondary | ICD-10-CM | POA: Diagnosis not present

## 2016-10-06 DIAGNOSIS — C44519 Basal cell carcinoma of skin of other part of trunk: Secondary | ICD-10-CM | POA: Diagnosis not present

## 2016-10-06 DIAGNOSIS — D485 Neoplasm of uncertain behavior of skin: Secondary | ICD-10-CM | POA: Diagnosis not present

## 2016-10-06 DIAGNOSIS — L821 Other seborrheic keratosis: Secondary | ICD-10-CM | POA: Diagnosis not present

## 2016-10-06 DIAGNOSIS — D1801 Hemangioma of skin and subcutaneous tissue: Secondary | ICD-10-CM | POA: Diagnosis not present

## 2016-10-06 DIAGNOSIS — D034 Melanoma in situ of scalp and neck: Secondary | ICD-10-CM | POA: Diagnosis not present

## 2016-10-12 DIAGNOSIS — D485 Neoplasm of uncertain behavior of skin: Secondary | ICD-10-CM | POA: Diagnosis not present

## 2016-10-12 DIAGNOSIS — Z85828 Personal history of other malignant neoplasm of skin: Secondary | ICD-10-CM | POA: Diagnosis not present

## 2016-10-12 DIAGNOSIS — Z85820 Personal history of malignant melanoma of skin: Secondary | ICD-10-CM | POA: Diagnosis not present

## 2016-10-12 DIAGNOSIS — D034 Melanoma in situ of scalp and neck: Secondary | ICD-10-CM | POA: Diagnosis not present

## 2016-11-15 DIAGNOSIS — L900 Lichen sclerosus et atrophicus: Secondary | ICD-10-CM | POA: Diagnosis not present

## 2016-11-15 DIAGNOSIS — D034 Melanoma in situ of scalp and neck: Secondary | ICD-10-CM | POA: Diagnosis not present

## 2016-11-16 ENCOUNTER — Telehealth: Payer: Self-pay | Admitting: Internal Medicine

## 2016-11-16 NOTE — Telephone Encounter (Signed)
Patient declined medicare wellness appointment.

## 2016-11-24 DIAGNOSIS — H43811 Vitreous degeneration, right eye: Secondary | ICD-10-CM | POA: Diagnosis not present

## 2016-11-29 DIAGNOSIS — S0100XA Unspecified open wound of scalp, initial encounter: Secondary | ICD-10-CM | POA: Diagnosis not present

## 2016-11-29 DIAGNOSIS — Z85828 Personal history of other malignant neoplasm of skin: Secondary | ICD-10-CM | POA: Diagnosis not present

## 2016-12-13 DIAGNOSIS — S0100XA Unspecified open wound of scalp, initial encounter: Secondary | ICD-10-CM | POA: Diagnosis not present

## 2017-01-13 ENCOUNTER — Telehealth: Payer: Self-pay | Admitting: Internal Medicine

## 2017-01-13 NOTE — Telephone Encounter (Signed)
Pt declined AWV at this time.  °

## 2017-01-17 DIAGNOSIS — S0100XD Unspecified open wound of scalp, subsequent encounter: Secondary | ICD-10-CM | POA: Diagnosis not present

## 2017-04-24 DIAGNOSIS — M25512 Pain in left shoulder: Secondary | ICD-10-CM | POA: Diagnosis not present

## 2017-05-30 DIAGNOSIS — Z85820 Personal history of malignant melanoma of skin: Secondary | ICD-10-CM | POA: Diagnosis not present

## 2017-05-30 DIAGNOSIS — L821 Other seborrheic keratosis: Secondary | ICD-10-CM | POA: Diagnosis not present

## 2017-05-30 DIAGNOSIS — L814 Other melanin hyperpigmentation: Secondary | ICD-10-CM | POA: Diagnosis not present

## 2017-05-30 DIAGNOSIS — Z85828 Personal history of other malignant neoplasm of skin: Secondary | ICD-10-CM | POA: Diagnosis not present

## 2017-05-30 DIAGNOSIS — D1801 Hemangioma of skin and subcutaneous tissue: Secondary | ICD-10-CM | POA: Diagnosis not present

## 2017-05-30 DIAGNOSIS — L570 Actinic keratosis: Secondary | ICD-10-CM | POA: Diagnosis not present

## 2017-05-30 DIAGNOSIS — D485 Neoplasm of uncertain behavior of skin: Secondary | ICD-10-CM | POA: Diagnosis not present

## 2017-06-30 ENCOUNTER — Telehealth: Payer: Self-pay | Admitting: Internal Medicine

## 2017-06-30 NOTE — Telephone Encounter (Signed)
Ok w/ me , thx 

## 2017-06-30 NOTE — Telephone Encounter (Signed)
Patient would like to transfer care to Dr. [name] here at [facility] from Dr. Larose Kells at [facility] due to location. Out of courtesy for both providers I will await approval or denial before making the transfer official. Please respond with approval or denial of transfer.

## 2017-06-30 NOTE — Telephone Encounter (Signed)
[place] with me.  Algis Greenhouse. [name], MD 06/30/2017 11:07 AM

## 2017-07-24 ENCOUNTER — Ambulatory Visit (INDEPENDENT_AMBULATORY_CARE_PROVIDER_SITE_OTHER): Payer: PPO | Admitting: Family Medicine

## 2017-07-24 ENCOUNTER — Encounter: Payer: Self-pay | Admitting: Family Medicine

## 2017-07-24 VITALS — BP 126/82 | HR 63 | Temp 98.0°F | Ht 70.75 in | Wt 269.4 lb

## 2017-07-24 DIAGNOSIS — Z125 Encounter for screening for malignant neoplasm of prostate: Secondary | ICD-10-CM

## 2017-07-24 DIAGNOSIS — Z0001 Encounter for general adult medical examination with abnormal findings: Secondary | ICD-10-CM

## 2017-07-24 DIAGNOSIS — I10 Essential (primary) hypertension: Secondary | ICD-10-CM | POA: Diagnosis not present

## 2017-07-24 DIAGNOSIS — R7303 Prediabetes: Secondary | ICD-10-CM | POA: Diagnosis not present

## 2017-07-24 DIAGNOSIS — E785 Hyperlipidemia, unspecified: Secondary | ICD-10-CM

## 2017-07-24 DIAGNOSIS — K219 Gastro-esophageal reflux disease without esophagitis: Secondary | ICD-10-CM

## 2017-07-24 DIAGNOSIS — H6123 Impacted cerumen, bilateral: Secondary | ICD-10-CM

## 2017-07-24 DIAGNOSIS — I4892 Unspecified atrial flutter: Secondary | ICD-10-CM | POA: Diagnosis not present

## 2017-07-24 LAB — COMPREHENSIVE METABOLIC PANEL
ALK PHOS: 66 U/L (ref 39–117)
ALT: 24 U/L (ref 0–53)
AST: 23 U/L (ref 0–37)
Albumin: 4.3 g/dL (ref 3.5–5.2)
BILIRUBIN TOTAL: 0.4 mg/dL (ref 0.2–1.2)
BUN: 13 mg/dL (ref 6–23)
CALCIUM: 9.7 mg/dL (ref 8.4–10.5)
CO2: 25 mEq/L (ref 19–32)
Chloride: 100 mEq/L (ref 96–112)
Creatinine, Ser: 0.77 mg/dL (ref 0.40–1.50)
GFR: 104.9 mL/min (ref >60.00)
Glucose, Bld: 117 mg/dL — ABNORMAL HIGH (ref 70–99)
Potassium: 4.6 mEq/L (ref 3.5–5.1)
Sodium: 135 mEq/L (ref 135–145)
TOTAL PROTEIN: 6.4 g/dL (ref 6.0–8.3)

## 2017-07-24 LAB — LIPID PANEL
CHOL/HDL RATIO: 4
Cholesterol: 160 mg/dL (ref 0–200)
HDL: 37.1 mg/dL — AB (ref >39.00)
LDL CALC: 108 mg/dL — AB (ref 0–99)
NONHDL: 123.05
TRIGLYCERIDES: 75 mg/dL (ref 0.0–149.0)
VLDL: 15 mg/dL (ref 0.0–40.0)

## 2017-07-24 LAB — HEMOGLOBIN A1C: Hgb A1c MFr Bld: 6.2 % (ref 4.6–6.5)

## 2017-07-24 LAB — CBC
HCT: 42.9 % (ref 39.0–52.0)
HEMOGLOBIN: 14.3 g/dL (ref 13.0–17.0)
MCHC: 33.4 g/dL (ref 30.0–36.0)
MCV: 93.8 fl (ref 78.0–100.0)
PLATELETS: 241 10*3/uL (ref 150.0–400.0)
RBC: 4.57 Mil/uL (ref 4.22–5.81)
RDW: 13.2 % (ref 11.5–15.5)
WBC: 5.4 10*3/uL (ref 4.0–10.5)

## 2017-07-24 LAB — PSA: PSA: 1.39 ng/mL (ref 0.10–4.00)

## 2017-07-24 MED ORDER — PRAVASTATIN SODIUM 40 MG PO TABS: 20.0000 mg | ORAL_TABLET | Freq: Every day | ORAL | 3 refills | Status: DC

## 2017-07-24 MED ORDER — ESOMEPRAZOLE MAGNESIUM 40 MG PO CPDR: 40.0000 mg | DELAYED_RELEASE_CAPSULE | Freq: Every day | ORAL | 3 refills | Status: DC | PRN

## 2017-07-24 NOTE — Assessment & Plan Note (Signed)
At goal off lisinopril. We'll discontinue lisinopril. Encouraged patient to continue checking blood pressures at home. Follow-up in one year.

## 2017-07-24 NOTE — Assessment & Plan Note (Signed)
Nexium refilled today.

## 2017-07-24 NOTE — Assessment & Plan Note (Signed)
Check lipid panel today. Continue pravastatin.

## 2017-07-24 NOTE — Assessment & Plan Note (Signed)
In sinus rhythm today. CHADSvasc score of 1. Continue aspirin 325 mg daily.

## 2017-07-24 NOTE — Assessment & Plan Note (Signed)
Check A1c today. Discussed starting metformin today, however patient deferred.

## 2017-07-24 NOTE — Patient Instructions (Addendum)
We will check blood work.  Stop the lisinopril.  Come back in 1 year for your next visit, or sooner as needed.  Take care,  Dr [name]

## 2017-07-24 NOTE — Progress Notes (Signed)
Subjective:  Troy Garrison is a 76 y.o. male who presents today for his annual comprehensive physical exam.    HPI:  Troy Garrison has no acute complaints today.   He has a few Stable chronic conditions as outlined below:  Hypertension: No longer taking lisinopril. Has been well controlled at home. Hyperlipidemia. Stable on pravastatin 40 mg every other day. A flutter: Only on aspirin 325 daily. Status post ablation.  Insomnia: Stable on melatonin 10 mg capsules as needed. GERD: Controlled on Nexium as needed.  Lifestyle Diet: Trying intermittent fasting [place] - [place] at least 30 minutes daily. Plays golf twice weekly.   Depression screen PHQ 2/9 07/24/2017  Decreased Interest 0  Down, Depressed, Hopeless 0  PHQ - 2 Score 0   ROS: A 14 point review of systems was performed and was negative  PMH:  The following were reviewed and entered/updated in epic: Past Medical History:  Diagnosis Date  . Atrial flutter ([place])    S/P RFCA with Dr. Caryl Comes 6/12  . Bursitis of shoulder   . Cancer (HCC)    SKIN; BASAL & SQUAMOUS CELL  . GERD (gastroesophageal reflux disease)   . Hearing loss, neural    Left ear  . Hemorrhoids   . History of Guillain-Barre syndrome 2003  . Hyperlipidemia   . Injury of tendon of long head of biceps   . Left axis deviation 1999  . Osteoarthritis   . Peptic ulcer 1964   with bleed ; transfused  . Prediabetes 07/02/2008   Patient Active Problem List   Diagnosis Date Noted  . Allergic rhinitis 04/28/2014  . HTN (hypertension) 08/25/2011  . Atrial flutter ([place]) 02/09/2011  . Left anterior fascicular block 02/09/2011  . BPH (benign prostatic hyperplasia) 07/08/2009  . SKIN CANCER, HX OF 07/08/2009  . Hyperlipidemia 07/02/2008  . GERD 07/02/2008  . NOCTURIA 07/02/2008  . Prediabetes 07/02/2008  . GUILLAIN-BARRE SYNDROME 03/01/2007  . Osteoarthritis 03/01/2007   Past Surgical History:  Procedure Laterality Date  .  ABLATION OF DYSRHYTHMIC FOCUS  2012    for AF;Dr Caryl Comes  . colonoscopy with polypectomy  2011   Internal hemorrhoids; [facility]  . REPLACEMENT TOTAL KNEE  2000   Left Knee  . TOTAL KNEE ARTHROPLASTY  09/21/2011   Procedure: TOTAL KNEE ARTHROPLASTY;  Surgeon: Tobi Bastos;  Location: [facility];  Service: Orthopedics;  Laterality: Right;  Marland Kitchen VASECTOMY     Scar tissue removed 2 years after the vasectomy    Family History  Problem Relation Age of Onset  . Heart attack Father 54  . Heart failure Father        Died @ 65  . Lupus Mother   . Heart attack Brother 2       MI @ 54;.CABG @ 45  . Diabetes Brother   . Prostate cancer Paternal Grandfather        35  . Cancer Maternal Uncle        X2, unknown primary  . Stroke Maternal Grandmother        > 65  . Peripheral vascular disease Brother        S/P leg amputation  . Colon cancer Neg Hx   . Stomach cancer Neg Hx     Medications- reviewed and updated Current Outpatient Prescriptions  Medication Sig Dispense Refill  . aspirin 325 MG EC tablet Take 325 mg by mouth daily.    Marland Kitchen esomeprazole (NEXIUM) 40 MG capsule  Take 1 capsule (40 mg total) by mouth daily as needed. 90 capsule 3  . ibuprofen (ADVIL,MOTRIN) 200 MG tablet Take 200 mg by mouth as needed.    . Melatonin 10 MG CAPS Take 1 capsule by mouth daily.    . Multiple Vitamin (MULTIVITAMIN) tablet Take 1 tablet by mouth daily.      . Omega-3 Fatty Acids (FISH OIL) 500 MG CAPS Take 1,000 mg by mouth once.     . pravastatin (PRAVACHOL) 40 MG tablet Take 0.5 tablets (20 mg total) by mouth daily. 45 tablet 3   No current facility-administered medications for this visit.     Allergies-reviewed and updated No Known Allergies  Social History   Social History  . Marital status: Married    Spouse name: N/A  . Number of children: 5  . Years of education: N/A   Occupational History  . 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 Smoking  status: Former Smoker    Quit date: 10/24/1992  . Smokeless tobacco: Never Used     Comment: smoked 1960-1994, up to < 1 ppd  . Alcohol use Yes     Comment: wine  . Drug use: No  . Sexual activity: Not Asked   Other Topics Concern  . None   Social History Narrative   10 grandchildren     Objective:  Physical Exam: BP 126/82   Pulse 63   Temp 98 F (36.7 C) (Oral)   Ht 5' 10.75" (1.797 m)   Wt 269 lb 6 oz (122.2 kg)   SpO2 97%   BMI 37.84 kg/m   Body mass index is 37.84 kg/m. Gen: NAD, resting comfortably HEENT: Bilateral cerumen impaction noted bilaterally.OP clear. No thyromegaly noted.  CV: RRR with no murmurs appreciated Pulm: NWOB, CTAB with no crackles, wheezes, or rhonchi GI: Normal bowel sounds present. Soft, Nontender, Nondistended. MSK: no edema, cyanosis, or clubbing noted Skin: warm, dry Neuro: CN2-12 grossly intact. Strength 5/5 in upper and lower extremities. Reflexes symmetric and intact bilaterally.  Psych: Normal affect and thought content  Cerumen successfully irrigated by CMA today.  Assessment/Plan:  Atrial flutter In sinus rhythm today. CHADSvasc score of 1. Continue aspirin 325 mg daily.  HTN (hypertension) At goal off lisinopril. We'll discontinue lisinopril. Encouraged patient to continue checking blood pressures at home. Follow-up in one year.  GERD Nexium refilled today.  Hyperlipidemia Check lipid panel today. Continue pravastatin.  Prediabetes Check A1c today. Discussed starting metformin today, however patient deferred.  Cerumen impaction Successfully irrigated today. Hearing improved after irrigation.  Preventative Healthcare: Not a candidate for flu shot given his history of Guillian-Barre syndrome. We will check PSA today per patient request.  Patient Counseling:  -Nutrition: Stressed importance of moderation in sodium/caffeine intake, saturated fat and cholesterol, caloric balance, sufficient intake of fresh fruits,  vegetables, and fiber.  -Stressed the importance of regular exercise.   -Substance Abuse: Discussed cessation/primary prevention of tobacco, alcohol, or other drug use; driving or other dangerous activities under the influence; availability of treatment for abuse.   -Injury prevention: Discussed safety belts, safety helmets, smoke detector, smoking near bedding or upholstery.   -Sexuality: Discussed sexually transmitted diseases, partner selection, use of condoms, avoidance of unintended pregnancy and contraceptive alternatives.   -Dental health: Discussed importance of regular tooth brushing, flossing, and dental visits.  -Health maintenance and immunizations reviewed. Please refer to Health maintenance section.  Return to care in 1  year for next preventative visit.   Algis Greenhouse. [name], MD 07/24/2017 11:08 AM

## 2017-11-10 DIAGNOSIS — H903 Sensorineural hearing loss, bilateral: Secondary | ICD-10-CM | POA: Diagnosis not present

## 2017-11-27 DIAGNOSIS — H40013 Open angle with borderline findings, low risk, bilateral: Secondary | ICD-10-CM | POA: Diagnosis not present

## 2017-12-06 DIAGNOSIS — Z85820 Personal history of malignant melanoma of skin: Secondary | ICD-10-CM | POA: Diagnosis not present

## 2017-12-06 DIAGNOSIS — L570 Actinic keratosis: Secondary | ICD-10-CM | POA: Diagnosis not present

## 2017-12-06 DIAGNOSIS — Z85828 Personal history of other malignant neoplasm of skin: Secondary | ICD-10-CM | POA: Diagnosis not present

## 2017-12-06 DIAGNOSIS — D1801 Hemangioma of skin and subcutaneous tissue: Secondary | ICD-10-CM | POA: Diagnosis not present

## 2017-12-06 DIAGNOSIS — L821 Other seborrheic keratosis: Secondary | ICD-10-CM | POA: Diagnosis not present

## 2017-12-15 DIAGNOSIS — H903 Sensorineural hearing loss, bilateral: Secondary | ICD-10-CM | POA: Diagnosis not present

## 2017-12-30 ENCOUNTER — Encounter: Payer: Self-pay | Admitting: Gastroenterology

## 2018-01-02 DIAGNOSIS — L720 Epidermal cyst: Secondary | ICD-10-CM | POA: Diagnosis not present

## 2018-01-02 DIAGNOSIS — L02419 Cutaneous abscess of limb, unspecified: Secondary | ICD-10-CM | POA: Diagnosis not present

## 2018-01-02 DIAGNOSIS — M79671 Pain in right foot: Secondary | ICD-10-CM | POA: Diagnosis not present

## 2018-07-25 ENCOUNTER — Encounter: Payer: Self-pay | Admitting: Family Medicine

## 2018-07-25 ENCOUNTER — Encounter: Payer: Self-pay | Admitting: Gastroenterology

## 2018-07-25 ENCOUNTER — Ambulatory Visit (INDEPENDENT_AMBULATORY_CARE_PROVIDER_SITE_OTHER): Payer: PPO | Admitting: Family Medicine

## 2018-07-25 VITALS — BP 124/78 | HR 79 | Temp 97.8°F | Ht 72.0 in | Wt 263.0 lb

## 2018-07-25 DIAGNOSIS — Z1211 Encounter for screening for malignant neoplasm of colon: Secondary | ICD-10-CM

## 2018-07-25 DIAGNOSIS — Z125 Encounter for screening for malignant neoplasm of prostate: Secondary | ICD-10-CM | POA: Diagnosis not present

## 2018-07-25 DIAGNOSIS — E785 Hyperlipidemia, unspecified: Secondary | ICD-10-CM | POA: Diagnosis not present

## 2018-07-25 DIAGNOSIS — R7303 Prediabetes: Secondary | ICD-10-CM

## 2018-07-25 DIAGNOSIS — I4892 Unspecified atrial flutter: Secondary | ICD-10-CM | POA: Diagnosis not present

## 2018-07-25 DIAGNOSIS — K219 Gastro-esophageal reflux disease without esophagitis: Secondary | ICD-10-CM | POA: Diagnosis not present

## 2018-07-25 DIAGNOSIS — Z0001 Encounter for general adult medical examination with abnormal findings: Secondary | ICD-10-CM | POA: Diagnosis not present

## 2018-07-25 DIAGNOSIS — E669 Obesity, unspecified: Secondary | ICD-10-CM | POA: Insufficient documentation

## 2018-07-25 LAB — CBC
HCT: 44.4 % (ref 39.0–52.0)
Hemoglobin: 15.1 g/dL (ref 13.0–17.0)
MCHC: 34.1 g/dL (ref 30.0–36.0)
MCV: 91.9 fl (ref 78.0–100.0)
PLATELETS: 213 10*3/uL (ref 150.0–400.0)
RBC: 4.83 Mil/uL (ref 4.22–5.81)
RDW: 13.5 % (ref 11.5–15.5)
WBC: 6.4 10*3/uL (ref 4.0–10.5)

## 2018-07-25 LAB — PSA: PSA: 1.31 ng/mL (ref 0.10–4.00)

## 2018-07-25 LAB — LIPID PANEL
Cholesterol: 150 mg/dL (ref 0–200)
HDL: 36.2 mg/dL — ABNORMAL LOW (ref >39.00)
LDL CALC: 99 mg/dL (ref 0–99)
NONHDL: 113.85
Total CHOL/HDL Ratio: 4
Triglycerides: 73 mg/dL (ref 0.0–149.0)
VLDL: 14.6 mg/dL (ref 0.0–40.0)

## 2018-07-25 LAB — COMPREHENSIVE METABOLIC PANEL
ALT: 21 U/L (ref 0–53)
AST: 21 U/L (ref 0–37)
Albumin: 4.4 g/dL (ref 3.5–5.2)
Alkaline Phosphatase: 67 U/L (ref 39–117)
BILIRUBIN TOTAL: 0.5 mg/dL (ref 0.2–1.2)
BUN: 16 mg/dL (ref 6–23)
CALCIUM: 9.6 mg/dL (ref 8.4–10.5)
CO2: 26 meq/L (ref 19–32)
Chloride: 103 mEq/L (ref 96–112)
Creatinine, Ser: 0.9 mg/dL (ref 0.40–1.50)
GFR: 87.38 mL/min (ref >60.00)
Glucose, Bld: 129 mg/dL — ABNORMAL HIGH (ref 70–99)
Potassium: 4.9 mEq/L (ref 3.5–5.1)
Sodium: 138 mEq/L (ref 135–145)
TOTAL PROTEIN: 6.8 g/dL (ref 6.0–8.3)

## 2018-07-25 LAB — HEMOGLOBIN A1C: Hgb A1c MFr Bld: 6.2 % (ref 4.6–6.5)

## 2018-07-25 MED ORDER — PRAVASTATIN SODIUM 40 MG PO TABS: 20.0000 mg | ORAL_TABLET | Freq: Every day | ORAL | 3 refills | Status: DC

## 2018-07-25 NOTE — Assessment & Plan Note (Signed)
Stable.  Continue Nexium as needed.

## 2018-07-25 NOTE — Patient Instructions (Signed)
It was very nice to see you today!  Keep up the good work!  No changes today.  We will check blood work.  Come back to see me in 1 year, or sooner as needed.   Take care, Dr Parker   Preventive Care 65 Years and Older, Male Preventive care refers to lifestyle choices and visits with your health care provider that can promote health and wellness. What does preventive care include?  A yearly physical exam. This is also called an annual well check.  Dental exams once or twice a year.  Routine eye exams. Ask your health care provider how often you should have your eyes checked.  Personal lifestyle choices, including: ? Daily care of your teeth and gums. ? Regular physical activity. ? Eating a healthy diet. ? Avoiding tobacco and drug use. ? Limiting alcohol use. ? Practicing safe sex. ? Taking low doses of aspirin every day. ? Taking vitamin and mineral supplements as recommended by your health care provider. What happens during an annual well check? The services and screenings done by your health care provider during your annual well check will depend on your age, overall health, lifestyle risk factors, and family history of disease. Counseling Your health care provider may ask you questions about your:  Alcohol use.  Tobacco use.  Drug use.  Emotional well-being.  Home and relationship well-being.  Sexual activity.  Eating habits.  History of falls.  Memory and ability to understand (cognition).  Work and work environment.  Screening You may have the following tests or measurements:  Height, weight, and BMI.  Blood pressure.  Lipid and cholesterol levels. These may be checked every 5 years, or more frequently if you are over 50 years old.  Skin check.  Lung cancer screening. You may have this screening every year starting at age 55 if you have a 30-pack-year history of smoking and currently smoke or have quit within the past 15 years.  Fecal occult  blood test (FOBT) of the stool. You may have this test every year starting at age 50.  Flexible sigmoidoscopy or colonoscopy. You may have a sigmoidoscopy every 5 years or a colonoscopy every 10 years starting at age 50.  Prostate cancer screening. Recommendations will vary depending on your family history and other risks.  Hepatitis C blood test.  Hepatitis B blood test.  Sexually transmitted disease (STD) testing.  Diabetes screening. This is done by checking your blood sugar (glucose) after you have not eaten for a while (fasting). You may have this done every 1-3 years.  Abdominal aortic aneurysm (AAA) screening. You may need this if you are a current or former smoker.  Osteoporosis. You may be screened starting at age 70 if you are at high risk.  Talk with your health care provider about your test results, treatment options, and if necessary, the need for more tests. Vaccines Your health care provider may recommend certain vaccines, such as:  Influenza vaccine. This is recommended every year.  Tetanus, diphtheria, and acellular pertussis (Tdap, Td) vaccine. You may need a Td booster every 10 years.  Varicella vaccine. You may need this if you have not been vaccinated.  Zoster vaccine. You may need this after age 60.  Measles, mumps, and rubella (MMR) vaccine. You may need at least one dose of MMR if you were born in 1957 or later. You may also need a second dose.  Pneumococcal 13-valent conjugate (PCV13) vaccine. One dose is recommended after age 65.  Pneumococcal   polysaccharide (PPSV23) vaccine. One dose is recommended after age 65.  Meningococcal vaccine. You may need this if you have certain conditions.  Hepatitis A vaccine. You may need this if you have certain conditions or if you travel or work in places where you may be exposed to hepatitis A.  Hepatitis B vaccine. You may need this if you have certain conditions or if you travel or work in places where you may be  exposed to hepatitis B.  Haemophilus influenzae type b (Hib) vaccine. You may need this if you have certain risk factors.  Talk to your health care provider about which screenings and vaccines you need and how often you need them. This information is not intended to replace advice given to you by your health care provider. Make sure you discuss any questions you have with your health care provider. Document Released: 11/06/2015 Document Revised: 06/29/2016 Document Reviewed: 08/11/2015 Elsevier Interactive Patient Education  2018 [employer]  

## 2018-07-25 NOTE — Assessment & Plan Note (Signed)
Regular rhythm on exam.  Continue aspirin.  Check CBC and CMET.

## 2018-07-25 NOTE — Assessment & Plan Note (Signed)
Patient down 6 pounds since last year.  Congratulated patient on this.  Encouraged continued lifestyle modifications.  Follow-up in 1 year.

## 2018-07-25 NOTE — Progress Notes (Signed)
Subjective:  Troy Garrison is a 77 y.o. male who presents today for his annual comprehensive physical exam.    HPI:  He has no acute complaints today.   Lifestyle Diet: Intermittent fasting.  Tries to eat a healthy and balanced diet. Exercise: Walks 30 minutes every day.  Depression screen PHQ 2/9 07/25/2018  Decreased Interest 0  Down, Depressed, Hopeless 0  PHQ - 2 Score 0    Health Maintenance Due  Topic Date Due  . COLONOSCOPY  01/03/2018     ROS: A complete review of systems was negative.   PMH:  The following were reviewed and entered/updated in epic: Past Medical History:  Diagnosis Date  . Atrial flutter ([place])    S/P RFCA with Dr. Caryl Comes 6/12  . Bursitis of shoulder   . Cancer (HCC)    SKIN; BASAL & SQUAMOUS CELL  . GERD (gastroesophageal reflux disease)   . Hearing loss, neural    Left ear  . Hemorrhoids   . History of Guillain-Barre syndrome 2003  . Hyperlipidemia   . Injury of tendon of long head of biceps   . Left axis deviation 1999  . Osteoarthritis   . Peptic ulcer 1964   with bleed ; transfused  . Prediabetes 07/02/2008   Patient Active Problem List   Diagnosis Date Noted  . Morbid obesity ([place]) 07/25/2018  . Allergic rhinitis 04/28/2014  . HTN (hypertension) 08/25/2011  . Atrial flutter ([place]) 02/09/2011  . Left anterior fascicular block 02/09/2011  . BPH (benign prostatic hyperplasia) 07/08/2009  . SKIN CANCER, HX OF 07/08/2009  . Hyperlipidemia 07/02/2008  . GERD 07/02/2008  . NOCTURIA 07/02/2008  . Prediabetes 07/02/2008  . GUILLAIN-BARRE SYNDROME 03/01/2007  . Osteoarthritis 03/01/2007   Past Surgical History:  Procedure Laterality Date  . ABLATION OF DYSRHYTHMIC FOCUS  2012    for AF;Dr Caryl Comes  . colonoscopy with polypectomy  2011   Internal hemorrhoids; [facility]  . MELANOMA EXCISION     on scalp January 2019  . REPLACEMENT TOTAL KNEE  2000   Left Knee  . TOTAL KNEE ARTHROPLASTY  09/21/2011   Procedure: TOTAL KNEE  ARTHROPLASTY;  Surgeon: Tobi Bastos;  Location: [facility];  Service: Orthopedics;  Laterality: Right;  Marland Kitchen VASECTOMY     Scar tissue removed 2 years after the vasectomy    Family History  Problem Relation Age of Onset  . Heart attack Father 51  . Heart failure Father        Died @ 63  . Lupus Mother   . Heart attack Brother 7       MI @ 77;.CABG @ 34  . Diabetes Brother   . Prostate cancer Paternal Grandfather        34  . Cancer Maternal Uncle        X2, unknown primary  . Stroke Maternal Grandmother        > 65  . Peripheral vascular disease Brother        S/P leg amputation  . Colon cancer Neg Hx   . Stomach cancer Neg Hx     Medications- reviewed and updated Current Outpatient Medications  Medication Sig Dispense Refill  . aspirin 325 MG EC tablet Take 325 mg by mouth daily.    Marland Kitchen esomeprazole (NEXIUM) 40 MG capsule Take 1 capsule (40 mg total) by mouth daily as needed. 90 capsule 3  . ibuprofen (ADVIL,MOTRIN) 200 MG tablet Take 200 mg by mouth as needed.    Marland Kitchen  Melatonin 10 MG CAPS Take 1 capsule by mouth daily.    . Multiple Vitamin (MULTIVITAMIN) tablet Take 1 tablet by mouth daily.      . Omega-3 Fatty Acids (FISH OIL) 500 MG CAPS Take 1,000 mg by mouth once.     . pravastatin (PRAVACHOL) 40 MG tablet Take 0.5 tablets (20 mg total) by mouth daily. 45 tablet 3   No current facility-administered medications for this visit.     Allergies-reviewed and updated No Known Allergies  Social History   Socioeconomic History  . Marital status: Married    Spouse name: Not on file  . Number of children: 5  . Years of education: Not on file  . Highest education level: Not on file  Occupational History  . 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 Financial resource strain: Not on file  . Food insecurity:    Worry: Not on file    Inability: Not on file  . Transportation needs:    Medical: Not on file    Non-medical: Not on file    Tobacco Use  . Smoking status: Former Smoker    Last attempt to quit: 10/24/1992    Years since quitting: 25.7  . Smokeless tobacco: Never Used  . Tobacco comment: smoked 1960-1994, up to < 1 ppd  Substance and Sexual Activity  . Alcohol use: Yes    Comment: wine  . Drug use: No  . Sexual activity: Not on file  Lifestyle  . Physical activity:    Days per week: Not on file    Minutes per session: Not on file  . Stress: Not on file  Relationships  . Social connections:    Talks on phone: Not on file    Gets together: Not on file    Attends religious service: Not on file    Active member of club or organization: Not on file    Attends meetings of clubs or organizations: Not on file    Relationship status: Not on file  Other Topics Concern  . Not on file  Social History Narrative   10 grandchildren    Objective:  Physical Exam: BP 124/78 (BP Location: Left Arm, Patient Position: Sitting, Cuff Size: Normal)   Pulse 79   Temp 97.8 F (36.6 C) (Oral)   Ht 6' (1.829 m)   Wt 263 lb (119.3 kg)   SpO2 97%   BMI 35.67 kg/m   Body mass index is 35.67 kg/m. Wt Readings from Last 3 Encounters:  07/25/18 263 lb (119.3 kg)  07/24/17 269 lb 6 oz (122.2 kg)  07/20/16 275 lb (124.7 kg)   Gen: NAD, resting comfortably HEENT: TMs normal bilaterally. OP clear. No thyromegaly noted.  CV: RRR with no murmurs appreciated Pulm: NWOB, CTAB with no crackles, wheezes, or rhonchi GI: Normal bowel sounds present. Soft, Nontender, Nondistended. MSK: no edema, cyanosis, or clubbing noted Skin: warm, dry Neuro: CN2-12 grossly intact. Strength 5/5 in upper and lower extremities. Reflexes symmetric and intact bilaterally.  Psych: Normal affect and thought content  Assessment/Plan:  Prediabetes Diet controlled.  Check A1c with blood draw.  Hyperlipidemia Continue pravastatin 40 mg daily.  Check lipid panel today.  Atrial flutter Regular rhythm on exam.  Continue aspirin.  Check CBC and  CMET.  GERD Stable.  Continue Nexium as needed.  Morbid obesity ([place]) Patient down 6 pounds since last year.  Congratulated patient on this.  Encouraged continued lifestyle modifications.  Follow-up in 1 year.  Preventative Healthcare: Check lipid panel, PSA.  Referral placed for colon cancer screening.  Patient Counseling(The following topics were reviewed and/or handout was given):  -Nutrition: Stressed importance of moderation in sodium/caffeine intake, saturated fat and cholesterol, caloric balance, sufficient intake of fresh fruits, vegetables, and fiber.  -Stressed the importance of regular exercise.   -Substance Abuse: Discussed cessation/primary prevention of tobacco, alcohol, or other drug use; driving or other dangerous activities under the influence; availability of treatment for abuse.   -Injury prevention: Discussed safety belts, safety helmets, smoke detector, smoking near bedding or upholstery.   -Sexuality: Discussed sexually transmitted diseases, partner selection, use of condoms, avoidance of unintended pregnancy and contraceptive alternatives.   -Dental health: Discussed importance of regular tooth brushing, flossing, and dental visits.  -Health maintenance and immunizations reviewed. Please refer to Health maintenance section.  Return to care in 1 year for next preventative visit.   Algis Greenhouse. [name], MD 07/25/2018 11:43 AM

## 2018-07-25 NOTE — Assessment & Plan Note (Signed)
Diet controlled.  Check A1c with blood draw.

## 2018-07-25 NOTE — Assessment & Plan Note (Signed)
Continue pravastatin 40 mg daily.  Check lipid panel today.

## 2018-07-26 ENCOUNTER — Telehealth: Payer: Self-pay | Admitting: Family Medicine

## 2018-07-26 NOTE — Telephone Encounter (Signed)
Copied from [place] [phone]. Topic: Quick Communication - See Telephone Encounter >> Jul 26, 2018  5:08 PM Blase Mess A wrote: CRM for notification. See Telephone encounter for: 07/26/18. Deandra from [facility] is requesting record for the patient they were sent the wrong records. [phone]

## 2018-07-27 NOTE — Progress Notes (Signed)
Dr Marigene Ehlers interpretation of your lab work:  Your A1c is 6.2 - this is right were we want it to be. Your electrolytes, kidney function, liver function, and blood counts are all normal. Your PSA is normal.   Your "good" cholesterol is a bit low, but stable. The rest of your cholesterol levels are normal.   Please continue taking your medications as were prescribed. Keep up the good work with your diet and regular exercise. We can recheck blood work again in 1 year.   If you have any additional questions, please give [place] a call or send [place] a message through [place].  Take care, Dr [name]

## 2018-08-21 DIAGNOSIS — M25531 Pain in right wrist: Secondary | ICD-10-CM | POA: Diagnosis not present

## 2018-08-27 NOTE — Telephone Encounter (Addendum)
° °  Reason for call:  Spoke with [employer] and they verified they fax over records this morning, advised patient to follow up with [employer]) to verify records were received. [employer] Applicant # [phone]

## 2018-08-29 ENCOUNTER — Encounter: Payer: PPO | Admitting: Gastroenterology

## 2018-12-06 DIAGNOSIS — Z85828 Personal history of other malignant neoplasm of skin: Secondary | ICD-10-CM | POA: Diagnosis not present

## 2018-12-06 DIAGNOSIS — L821 Other seborrheic keratosis: Secondary | ICD-10-CM | POA: Diagnosis not present

## 2018-12-06 DIAGNOSIS — L565 Disseminated superficial actinic porokeratosis (DSAP): Secondary | ICD-10-CM | POA: Diagnosis not present

## 2018-12-06 DIAGNOSIS — L570 Actinic keratosis: Secondary | ICD-10-CM | POA: Diagnosis not present

## 2018-12-06 DIAGNOSIS — C44319 Basal cell carcinoma of skin of other parts of face: Secondary | ICD-10-CM | POA: Diagnosis not present

## 2018-12-06 DIAGNOSIS — D485 Neoplasm of uncertain behavior of skin: Secondary | ICD-10-CM | POA: Diagnosis not present

## 2018-12-06 DIAGNOSIS — Z85820 Personal history of malignant melanoma of skin: Secondary | ICD-10-CM | POA: Diagnosis not present

## 2019-01-01 DIAGNOSIS — Z85828 Personal history of other malignant neoplasm of skin: Secondary | ICD-10-CM | POA: Diagnosis not present

## 2019-01-01 DIAGNOSIS — C44319 Basal cell carcinoma of skin of other parts of face: Secondary | ICD-10-CM | POA: Diagnosis not present

## 2019-04-23 DIAGNOSIS — M25531 Pain in right wrist: Secondary | ICD-10-CM | POA: Diagnosis not present

## 2019-06-19 ENCOUNTER — Telehealth: Payer: Self-pay | Admitting: Family Medicine

## 2019-06-19 NOTE — Telephone Encounter (Signed)
I called the patient to schedule AWV with Loma Sousa, but he declined. VDM (Dee-Dee)

## 2019-07-30 DIAGNOSIS — M25511 Pain in right shoulder: Secondary | ICD-10-CM | POA: Diagnosis not present

## 2019-09-03 DIAGNOSIS — M25511 Pain in right shoulder: Secondary | ICD-10-CM | POA: Diagnosis not present

## 2019-09-17 ENCOUNTER — Telehealth: Payer: Self-pay | Admitting: Family Medicine

## 2019-09-17 ENCOUNTER — Other Ambulatory Visit: Payer: Self-pay

## 2019-09-17 MED ORDER — PRAVASTATIN SODIUM 40 MG PO TABS: 20.0000 mg | ORAL_TABLET | Freq: Every day | ORAL | 1 refills | Status: DC

## 2019-09-17 NOTE — Telephone Encounter (Signed)
Rx sent 

## 2019-09-17 NOTE — Telephone Encounter (Signed)
See below

## 2019-09-17 NOTE — Telephone Encounter (Signed)
Patient requesting a 90 day supply pravastatin (PRAVACHOL) 40 MG tablet , informed please allow 48 to 72 hour turn around tim, patient states he's completely out,   [facility] - [place], [place]

## 2019-10-30 DIAGNOSIS — M25519 Pain in unspecified shoulder: Secondary | ICD-10-CM | POA: Diagnosis not present

## 2019-10-30 DIAGNOSIS — M25511 Pain in right shoulder: Secondary | ICD-10-CM | POA: Diagnosis not present

## 2019-11-05 ENCOUNTER — Ambulatory Visit: Payer: Medicare Other | Attending: Internal Medicine

## 2019-11-05 DIAGNOSIS — Z23 Encounter for immunization: Secondary | ICD-10-CM | POA: Insufficient documentation

## 2019-11-05 NOTE — Progress Notes (Signed)
   Covid-19 Vaccination Clinic  Name:  Troy Garrison    MRN: [id number] DOB: 05/12/1941  11/05/2019  Troy Garrison was observed post Covid-19 immunization for 15 minutes without incidence. He was provided with Vaccine Information Sheet and instruction to access the V-Safe system.   Troy Garrison was instructed to call 911 with any severe reactions post vaccine: Marland Kitchen Difficulty breathing  . Swelling of your face and throat  . A fast heartbeat  . A bad rash all over your body  . Dizziness and weakness    Immunizations Administered    Name Date Dose VIS Date Route   Pfizer COVID-19 Vaccine 11/05/2019 11:32 AM 0.3 mL 10/04/2019 Intramuscular   Manufacturer: [place]   Lot: [id number]   [place]: [id number]

## 2019-11-06 ENCOUNTER — Encounter: Payer: Self-pay | Admitting: Family Medicine

## 2019-11-06 DIAGNOSIS — H40003 Preglaucoma, unspecified, bilateral: Secondary | ICD-10-CM | POA: Diagnosis not present

## 2019-11-06 DIAGNOSIS — H43811 Vitreous degeneration, right eye: Secondary | ICD-10-CM | POA: Diagnosis not present

## 2019-11-06 DIAGNOSIS — H2513 Age-related nuclear cataract, bilateral: Secondary | ICD-10-CM | POA: Diagnosis not present

## 2019-11-06 DIAGNOSIS — H25013 Cortical age-related cataract, bilateral: Secondary | ICD-10-CM | POA: Diagnosis not present

## 2019-11-12 DIAGNOSIS — M25519 Pain in unspecified shoulder: Secondary | ICD-10-CM | POA: Diagnosis not present

## 2019-11-18 DIAGNOSIS — H2511 Age-related nuclear cataract, right eye: Secondary | ICD-10-CM | POA: Diagnosis not present

## 2019-11-18 DIAGNOSIS — H25013 Cortical age-related cataract, bilateral: Secondary | ICD-10-CM | POA: Diagnosis not present

## 2019-11-18 DIAGNOSIS — H25043 Posterior subcapsular polar age-related cataract, bilateral: Secondary | ICD-10-CM | POA: Diagnosis not present

## 2019-11-18 DIAGNOSIS — H2513 Age-related nuclear cataract, bilateral: Secondary | ICD-10-CM | POA: Diagnosis not present

## 2019-11-25 ENCOUNTER — Ambulatory Visit: Payer: PPO | Attending: Internal Medicine

## 2019-11-25 DIAGNOSIS — Z23 Encounter for immunization: Secondary | ICD-10-CM | POA: Insufficient documentation

## 2019-11-25 NOTE — Progress Notes (Signed)
   Covid-19 Vaccination Clinic  Name:  Troy Garrison    MRN: [id number] DOB: 05/23/1941  11/25/2019  Mr. Troy Garrison was observed post Covid-19 immunization for 15 minutes without incidence. He was provided with Vaccine Information Sheet and instruction to access the V-Safe system.   Mr. Troy Garrison was instructed to call 911 with any severe reactions post vaccine: Marland Kitchen Difficulty breathing  . Swelling of your face and throat  . A fast heartbeat  . A bad rash all over your body  . Dizziness and weakness    Immunizations Administered    Name Date Dose VIS Date Route   Pfizer COVID-19 Vaccine 11/25/2019 10:42 AM 0.3 mL 10/04/2019 Intramuscular   Manufacturer: [place]   Lot: [id number]   [place]: [id number]

## 2019-12-03 DIAGNOSIS — H2511 Age-related nuclear cataract, right eye: Secondary | ICD-10-CM | POA: Diagnosis not present

## 2019-12-03 DIAGNOSIS — H25042 Posterior subcapsular polar age-related cataract, left eye: Secondary | ICD-10-CM | POA: Diagnosis not present

## 2019-12-03 DIAGNOSIS — H25811 Combined forms of age-related cataract, right eye: Secondary | ICD-10-CM | POA: Diagnosis not present

## 2019-12-03 DIAGNOSIS — H25012 Cortical age-related cataract, left eye: Secondary | ICD-10-CM | POA: Diagnosis not present

## 2019-12-03 DIAGNOSIS — H2512 Age-related nuclear cataract, left eye: Secondary | ICD-10-CM | POA: Diagnosis not present

## 2019-12-05 ENCOUNTER — Encounter: Payer: Self-pay | Admitting: Family Medicine

## 2019-12-11 DIAGNOSIS — Z85820 Personal history of malignant melanoma of skin: Secondary | ICD-10-CM | POA: Diagnosis not present

## 2019-12-11 DIAGNOSIS — L814 Other melanin hyperpigmentation: Secondary | ICD-10-CM | POA: Diagnosis not present

## 2019-12-11 DIAGNOSIS — L570 Actinic keratosis: Secondary | ICD-10-CM | POA: Diagnosis not present

## 2019-12-11 DIAGNOSIS — Z85828 Personal history of other malignant neoplasm of skin: Secondary | ICD-10-CM | POA: Diagnosis not present

## 2019-12-13 ENCOUNTER — Other Ambulatory Visit: Payer: Self-pay

## 2019-12-16 ENCOUNTER — Encounter: Payer: Self-pay | Admitting: Family Medicine

## 2019-12-16 ENCOUNTER — Ambulatory Visit (INDEPENDENT_AMBULATORY_CARE_PROVIDER_SITE_OTHER): Payer: PPO | Admitting: Family Medicine

## 2019-12-16 VITALS — BP 142/80 | HR 69 | Temp 97.6°F | Ht 72.0 in | Wt 271.2 lb

## 2019-12-16 DIAGNOSIS — Z0000 Encounter for general adult medical examination without abnormal findings: Secondary | ICD-10-CM | POA: Diagnosis not present

## 2019-12-16 DIAGNOSIS — E785 Hyperlipidemia, unspecified: Secondary | ICD-10-CM | POA: Diagnosis not present

## 2019-12-16 DIAGNOSIS — Z6836 Body mass index (BMI) 36.0-36.9, adult: Secondary | ICD-10-CM

## 2019-12-16 DIAGNOSIS — K219 Gastro-esophageal reflux disease without esophagitis: Secondary | ICD-10-CM | POA: Diagnosis not present

## 2019-12-16 DIAGNOSIS — I10 Essential (primary) hypertension: Secondary | ICD-10-CM

## 2019-12-16 DIAGNOSIS — Z0001 Encounter for general adult medical examination with abnormal findings: Secondary | ICD-10-CM

## 2019-12-16 DIAGNOSIS — R7303 Prediabetes: Secondary | ICD-10-CM | POA: Diagnosis not present

## 2019-12-16 DIAGNOSIS — Z125 Encounter for screening for malignant neoplasm of prostate: Secondary | ICD-10-CM | POA: Diagnosis not present

## 2019-12-16 LAB — LIPID PANEL
Cholesterol: 141 mg/dL (ref 0–200)
HDL: 34 mg/dL — ABNORMAL LOW (ref >39.00)
LDL Cholesterol: 90 mg/dL (ref 0–99)
NonHDL: 106.7
Total CHOL/HDL Ratio: 4
Triglycerides: 83 mg/dL (ref 0.0–149.0)
VLDL: 16.6 mg/dL (ref 0.0–40.0)

## 2019-12-16 LAB — PSA: PSA: 1.1 ng/mL (ref 0.10–4.00)

## 2019-12-16 LAB — COMPREHENSIVE METABOLIC PANEL
ALT: 27 U/L (ref 0–53)
AST: 26 U/L (ref 0–37)
Albumin: 4.5 g/dL (ref 3.5–5.2)
Alkaline Phosphatase: 76 U/L (ref 39–117)
BUN: 16 mg/dL (ref 6–23)
CO2: 30 mEq/L (ref 19–32)
Calcium: 9.6 mg/dL (ref 8.4–10.5)
Chloride: 103 mEq/L (ref 96–112)
Creatinine, Ser: 0.97 mg/dL (ref 0.40–1.50)
GFR: 75.13 mL/min (ref >60.00)
Glucose, Bld: 151 mg/dL — ABNORMAL HIGH (ref 70–99)
Potassium: 4.9 mEq/L (ref 3.5–5.1)
Sodium: 140 mEq/L (ref 135–145)
Total Bilirubin: 0.5 mg/dL (ref 0.2–1.2)
Total Protein: 6.6 g/dL (ref 6.0–8.3)

## 2019-12-16 LAB — CBC
HCT: 45.7 % (ref 39.0–52.0)
Hemoglobin: 15.1 g/dL (ref 13.0–17.0)
MCHC: 33 g/dL (ref 30.0–36.0)
MCV: 93.9 fl (ref 78.0–100.0)
Platelets: 223 10*3/uL (ref 150.0–400.0)
RBC: 4.87 Mil/uL (ref 4.22–5.81)
RDW: 13 % (ref 11.5–15.5)
WBC: 6.4 10*3/uL (ref 4.0–10.5)

## 2019-12-16 LAB — TSH: TSH: 2.92 u[IU]/mL (ref 0.35–4.50)

## 2019-12-16 LAB — HEMOGLOBIN A1C: Hgb A1c MFr Bld: 6.7 % — ABNORMAL HIGH (ref 4.6–6.5)

## 2019-12-16 MED ORDER — LISINOPRIL 10 MG PO TABS: 10.0000 mg | ORAL_TABLET | Freq: Every day | ORAL | 3 refills | Status: DC

## 2019-12-16 MED ORDER — PRAVASTATIN SODIUM 40 MG PO TABS: 20.0000 mg | ORAL_TABLET | Freq: Every day | ORAL | 1 refills | Status: DC

## 2019-12-16 NOTE — Assessment & Plan Note (Signed)
At goal per JNC 8.  Will restart lisinopril 10 mg daily per patient request.  Check CBC, C met, TSH today.  Discussed home blood pressure monitoring with goal 140/90 or lower.  Discussed potential side effects and  reasons to return to care.

## 2019-12-16 NOTE — Assessment & Plan Note (Signed)
Check A1c today.

## 2019-12-16 NOTE — Assessment & Plan Note (Signed)
Stable.  Continue Nexium 40 mg daily as needed.

## 2019-12-16 NOTE — Assessment & Plan Note (Signed)
Stable.  Will check lipid panel today.  Continue pravastatin 20 mg daily.

## 2019-12-16 NOTE — Patient Instructions (Signed)
It was very nice to see you today!  We will check blood work today.  I will restart your lisinopril.  Keep an eye on your blood pressure and let me know if persistently 140/90 or higher or if you have any lows with symptoms such as dizziness or lightheadedness.  Come back to see me in 1 year for your next physical with blood work, or sooner if needed.  Take care, Dr [name]  Please try these tips to maintain a healthy lifestyle:   Eat at least 3 REAL meals and 1-2 snacks per day.  Aim for no more than 5 hours between eating.  If you eat breakfast, please do so within one hour of getting up.    Each meal should contain half fruits/vegetables, one quarter protein, and one quarter carbs (no bigger than a computer mouse)   Cut down on sweet beverages. This includes juice, soda, and sweet tea.     Drink at least 1 glass of water with each meal and aim for at least 8 glasses per day   Exercise at least 150 minutes every week.    Preventive Care 77 Years and Older, Male Preventive care refers to lifestyle choices and visits with your health care provider that can promote health and wellness. This includes:  A yearly physical exam. This is also called an annual well check.  Regular dental and eye exams.  Immunizations.  Screening for certain conditions.  Healthy lifestyle choices, such as diet and exercise. What can I expect for my preventive care visit? Physical exam Your health care provider will check:  Height and weight. These may be used to calculate body mass index (BMI), which is a measurement that tells if you are at a healthy weight.  Heart rate and blood pressure.  Your skin for abnormal spots. Counseling Your health care provider may ask you questions about:  Alcohol, tobacco, and drug use.  Emotional well-being.  Home and relationship well-being.  Sexual activity.  Eating habits.  History of falls.  Memory and ability to understand  (cognition).  Work and work Statistician. What immunizations do I need?  Influenza (flu) vaccine  This is recommended every year. Tetanus, diphtheria, and pertussis (Tdap) vaccine  You may need a Td booster every 10 years. Varicella (chickenpox) vaccine  You may need this vaccine if you have not already been vaccinated. Zoster (shingles) vaccine  You may need this after age 24. Pneumococcal conjugate (PCV13) vaccine  One dose is recommended after age 18. Pneumococcal polysaccharide (PPSV23) vaccine  One dose is recommended after age 90. Measles, mumps, and rubella (MMR) vaccine  You may need at least one dose of MMR if you were born in 1957 or later. You may also need a second dose. Meningococcal conjugate (MenACWY) vaccine  You may need this if you have certain conditions. Hepatitis A vaccine  You may need this if you have certain conditions or if you travel or work in places where you may be exposed to hepatitis A. Hepatitis B vaccine  You may need this if you have certain conditions or if you travel or work in places where you may be exposed to hepatitis B. Haemophilus influenzae type b (Hib) vaccine  You may need this if you have certain conditions. You may receive vaccines as individual doses or as more than one vaccine together in one shot (combination vaccines). Talk with your health care provider about the risks and benefits of combination vaccines. What tests do I need? Blood  tests  Lipid and cholesterol levels. These may be checked every 5 years, or more frequently depending on your overall health.  Hepatitis C test.  Hepatitis B test. Screening  Lung cancer screening. You may have this screening every year starting at age 14 if you have a 30-pack-year history of smoking and currently smoke or have quit within the past 15 years.  Colorectal cancer screening. All adults should have this screening starting at age 42 and continuing until age 56. Your health  care provider may recommend screening at age 48 if you are at increased risk. You will have tests every 1-10 years, depending on your results and the type of screening test.  Prostate cancer screening. Recommendations will vary depending on your family history and other risks.  Diabetes screening. This is done by checking your blood sugar (glucose) after you have not eaten for a while (fasting). You may have this done every 1-3 years.  Abdominal aortic aneurysm (AAA) screening. You may need this if you are a current or former smoker.  Sexually transmitted disease (STD) testing. Follow these instructions at home: Eating and drinking  Eat a diet that includes fresh fruits and vegetables, whole grains, lean protein, and low-fat dairy products. Limit your intake of foods with high amounts of sugar, saturated fats, and salt.  Take vitamin and mineral supplements as recommended by your health care provider.  Do not drink alcohol if your health care provider tells you not to drink.  If you drink alcohol: ? Limit how much you have to 0-2 drinks a day. ? Be aware of how much alcohol is in your drink. In the [place] one drink equals one 12 oz bottle of beer (355 mL), one 5 oz glass of wine (148 mL), or one 1 oz glass of hard liquor (44 mL). Lifestyle  Take daily care of your teeth and gums.  Stay active. Exercise for at least 30 minutes on 5 or more days each week.  Do not use any products that contain nicotine or tobacco, such as cigarettes, e-cigarettes, and chewing tobacco. If you need help quitting, ask your health care provider.  If you are sexually active, practice safe sex. Use a condom or other form of protection to prevent STIs (sexually transmitted infections).  Talk with your health care provider about taking a low-dose aspirin or statin. What's next?  Visit your health care provider once a year for a well check visit.  Ask your health care provider how often you should have your  eyes and teeth checked.  Stay up to date on all vaccines. This information is not intended to replace advice given to you by your health care provider. Make sure you discuss any questions you have with your health care provider. Document Revised: 10/04/2018 Document Reviewed: 10/04/2018 Elsevier Patient Education  2020 [employer].

## 2019-12-16 NOTE — Progress Notes (Signed)
Chief Complaint:  Troy Garrison is a 79 y.o. male who presents today for his annual comprehensive physical exam and subsequent medicare annual wellness visit.    Assessment/Plan:  Chronic Problems Addressed Today: Prediabetes Check A1c today.  GERD Stable.  Continue Nexium 40 mg daily as needed.  Hyperlipidemia Stable.  Will check lipid panel today.  Continue pravastatin 20 mg daily.  HTN (hypertension) At goal per JNC 8.  Will restart lisinopril 10 mg daily per patient request.  Check CBC, C met, TSH today.  Discussed home blood pressure monitoring with goal 140/90 or lower.  Discussed potential side effects and  reasons to return to care.  Body mass index is 36.78 kg/m. / Obese BMI Metric Follow Up - 12/16/19 1008      BMI Metric Follow Up-Please document annually   BMI Metric Follow Up  Education provided       Preventative Healthcare: Up-to-date on vaccines and screenings.  No longer needs colonoscopy due to age.  Discussed 1 more screening between ages of 64 and 13 however patient declined.  We will check labs including CBC, C met, TSH, lipid panel, and PSA.  Due for flu vaccine next season.  Has already been vaccinated for COVID-19.  Patient Counseling(The following topics were reviewed and/or handout was given):  -Nutrition: Stressed importance of moderation in sodium/caffeine intake, saturated fat and cholesterol, caloric balance, sufficient intake of fresh fruits, vegetables, and fiber.  -Stressed the importance of regular exercise.   -Substance Abuse: Discussed cessation/primary prevention of tobacco, alcohol, or other drug use; driving or other dangerous activities under the influence; availability of treatment for abuse.   -Injury prevention: Discussed safety belts, safety helmets, smoke detector, smoking near bedding or upholstery.   -Sexuality: Discussed sexually transmitted diseases, partner selection, use of condoms, avoidance of unintended pregnancy and  contraceptive alternatives.   -Dental health: Discussed importance of regular tooth brushing, flossing, and dental visits.  -Health maintenance and immunizations reviewed. Please refer to Health maintenance section.  During the course of the visit the patient was educated and counseled about appropriate screening and preventive services including:        Fall prevention   Nutrition Physical Activity Weight Management Cognition  Return to care in 1 year for next preventative visit.     Subjective:  HPI:  He would like to restart blood pressure medication today.  Recently had cataract surgery with lens implantation was found to have systolic blood pressure in the low 90s.  Is previously on lisinopril 10 mg daily and did well with this.  Has been on for the last couple of years.  He is able to check his blood pressures at home.  No reported chest pain or shortness of breath.   Health Risk Assessment: Patient considers his overall health to be good. He has no difficulty performing the following: . Preparing food and eating . Bathing  . Getting dressed . Using the toilet . Shopping . Managing Finances . Moving around from place to place  He has not had any falls within the past year.   Depression screen PHQ 2/9 07/25/2018  Decreased Interest 0  Down, Depressed, Hopeless 0  PHQ - 2 Score 0   Lifestyle Factors: Diet: Balanced. Plenty of fruits and vegetables.  Exercise: Walk 30 minutes per day.   Patient Care Team: Vivi Barrack, MD as PCP - General (Family Medicine) Latanya Maudlin, MD as Consulting Physician (Orthopedic Surgery) Melida Quitter, MD as Consulting Physician (Otolaryngology)  ROS: Per HPI, otherwise a complete review of systems was negative.   PMH:  The following were reviewed and entered/updated in epic: Past Medical History:  Diagnosis Date  . Atrial flutter ([place])    S/P RFCA with Dr. Caryl Comes 6/12  . Bursitis of shoulder   . Cancer (HCC)    SKIN;  BASAL & SQUAMOUS CELL  . GERD (gastroesophageal reflux disease)   . Hearing loss, neural    Left ear  . Hemorrhoids   . History of Guillain-Barre syndrome 2003  . Hyperlipidemia   . Injury of tendon of long head of biceps   . Left axis deviation 1999  . Osteoarthritis   . Peptic ulcer 1964   with bleed ; transfused  . Prediabetes 07/02/2008   Patient Active Problem List   Diagnosis Date Noted  . Morbid obesity ([place]) 07/25/2018  . Allergic rhinitis 04/28/2014  . HTN (hypertension) 08/25/2011  . Atrial flutter ([place]) 02/09/2011  . BPH (benign prostatic hyperplasia) 07/08/2009  . SKIN CANCER, HX OF 07/08/2009  . Hyperlipidemia 07/02/2008  . GERD 07/02/2008  . NOCTURIA 07/02/2008  . Prediabetes 07/02/2008  . GUILLAIN-BARRE SYNDROME 03/01/2007  . Osteoarthritis 03/01/2007   Past Surgical History:  Procedure Laterality Date  . ABLATION OF DYSRHYTHMIC FOCUS  2012    for AF;Dr Caryl Comes  . colonoscopy with polypectomy  2011   Internal hemorrhoids; [facility]  . INTRAOCULAR LENS INSERTION    . MELANOMA EXCISION     on scalp January 2019  . REPLACEMENT TOTAL KNEE  2000   Left Knee  . TOTAL KNEE ARTHROPLASTY  09/21/2011   Procedure: TOTAL KNEE ARTHROPLASTY;  Surgeon: Tobi Bastos;  Location: [facility];  Service: Orthopedics;  Laterality: Right;  Marland Kitchen VASECTOMY     Scar tissue removed 2 years after the vasectomy    Family History  Problem Relation Age of Onset  . Heart attack Father 59  . Heart failure Father        Died @ 64  . Lupus Mother   . Heart attack Brother 19       MI @ 75;.CABG @ 31  . Diabetes Brother   . Prostate cancer Paternal Grandfather        14  . Cancer Maternal Uncle        X2, unknown primary  . Stroke Maternal Grandmother        > 65  . Peripheral vascular disease Brother        S/P leg amputation  . Colon cancer Neg Hx   . Stomach cancer Neg Hx     Medications- reviewed and updated Current Outpatient Medications  Medication Sig Dispense Refill    . aspirin 325 MG EC tablet Take 325 mg by mouth daily.    Marland Kitchen esomeprazole (NEXIUM) 40 MG capsule Take 1 capsule (40 mg total) by mouth daily as needed. 90 capsule 3  . ibuprofen (ADVIL,MOTRIN) 200 MG tablet Take 200 mg by mouth as needed.    . Melatonin 10 MG CAPS Take 1 capsule by mouth daily.    . Multiple Vitamin (MULTIVITAMIN) tablet Take 1 tablet by mouth daily.      . Omega-3 Fatty Acids (FISH OIL) 500 MG CAPS Take 1,000 mg by mouth once.     . pravastatin (PRAVACHOL) 40 MG tablet Take 0.5 tablets (20 mg total) by mouth daily. 45 tablet 1  . lisinopril (ZESTRIL) 10 MG tablet Take 1 tablet (10 mg total) by mouth daily. 90 tablet 3  No current facility-administered medications for this visit.    Allergies-reviewed and updated No Known Allergies  Social History   Socioeconomic History  . Marital status: Married    Spouse name: Not on file  . Number of children: 5  . Years of education: Not on file  . Highest education level: Not on file  Occupational History  . 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 Smoking status: Former Smoker    Quit date: 10/24/1992    Years since quitting: 27.1  . Smokeless tobacco: Never Used  . Tobacco comment: smoked 1960-1994, up to < 1 ppd  Substance and Sexual Activity  . Alcohol use: Yes    Comment: wine  . Drug use: No  . Sexual activity: Not on file  Other Topics Concern  . Not on file  Social History Narrative   10 grandchildren    Social Determinants of Health   Financial Resource Strain:   . Difficulty of Paying Living Expenses: Not on file  Food Insecurity:   . Worried About Charity fundraiser in the Last Year: Not on file  . Ran Out of Food in the Last Year: Not on file  Transportation Needs:   . Lack of Transportation (Medical): Not on file  . Lack of Transportation (Non-Medical): Not on file  Physical Activity:   . Days of Exercise per Week: Not on file  . Minutes of Exercise per  Session: Not on file  Stress:   . Feeling of Stress : Not on file  Social Connections:   . Frequency of Communication with Friends and Family: Not on file  . Frequency of Social Gatherings with Friends and Family: Not on file  . Attends Religious Services: Not on file  . Active Member of Clubs or Organizations: Not on file  . Attends Archivist Meetings: Not on file  . Marital Status: Not on file        Objective:  Physical Exam: BP (!) 142/80   Pulse 69   Temp 97.6 F (36.4 C)   Ht 6' (1.829 m)   Wt 271 lb 3.2 oz (123 kg)   SpO2 98%   BMI 36.78 kg/m   Body mass index is 36.78 kg/m. Wt Readings from Last 3 Encounters:  12/16/19 271 lb 3.2 oz (123 kg)  07/25/18 263 lb (119.3 kg)  07/24/17 269 lb 6 oz (122.2 kg)   Gen: NAD, resting comfortably HEENT: TMs normal bilaterally. OP clear. No thyromegaly noted.  CV: RRR with no murmurs appreciated Pulm: NWOB, CTAB with no crackles, wheezes, or rhonchi GI: Normal bowel sounds present. Soft, Nontender, Nondistended. MSK: no edema, cyanosis, or clubbing noted Skin: warm, dry Neuro: CN2-12 grossly intact. Strength 5/5 in upper and lower extremities. Reflexes symmetric and intact bilaterally. Normal minicog.  Psych: Normal affect and thought content     Barrie Wale M. [name], MD 12/16/2019 10:09 AM

## 2019-12-17 DIAGNOSIS — H25812 Combined forms of age-related cataract, left eye: Secondary | ICD-10-CM | POA: Diagnosis not present

## 2019-12-17 DIAGNOSIS — H2512 Age-related nuclear cataract, left eye: Secondary | ICD-10-CM | POA: Diagnosis not present

## 2019-12-17 NOTE — Progress Notes (Signed)
Please inform patient of the following:  Blood sugar is up since last time but all of his other labs are STABLE. He may benefit from starting metformin 750mg  daily to improve his numbers and lower risk of heart attack and stroke. Please send in if he is willing to start. Regardless, he should continue to work on diet and exercise and we can recheck in a year or so.  Troy Garrison. [name], MD 12/17/2019 1:07 PM

## 2019-12-18 ENCOUNTER — Encounter: Payer: Self-pay | Admitting: Family Medicine

## 2019-12-18 ENCOUNTER — Other Ambulatory Visit: Payer: Self-pay

## 2019-12-18 MED ORDER — METFORMIN HCL ER 750 MG PO TB24: 750.0000 mg | ORAL_TABLET | Freq: Every day | ORAL | 0 refills | Status: DC

## 2019-12-27 ENCOUNTER — Encounter: Payer: Self-pay | Admitting: Family Medicine

## 2020-02-13 ENCOUNTER — Other Ambulatory Visit: Payer: Self-pay | Admitting: *Deleted

## 2020-02-13 MED ORDER — METFORMIN HCL ER 750 MG PO TB24: 750.0000 mg | ORAL_TABLET | Freq: Every day | ORAL | 0 refills | Status: DC

## 2020-03-10 DIAGNOSIS — M25519 Pain in unspecified shoulder: Secondary | ICD-10-CM | POA: Diagnosis not present

## 2020-03-10 DIAGNOSIS — S46011D Strain of muscle(s) and tendon(s) of the rotator cuff of right shoulder, subsequent encounter: Secondary | ICD-10-CM | POA: Diagnosis not present

## 2020-04-15 DIAGNOSIS — H16223 Keratoconjunctivitis sicca, not specified as Sjogren's, bilateral: Secondary | ICD-10-CM | POA: Diagnosis not present

## 2020-04-21 ENCOUNTER — Other Ambulatory Visit: Payer: Self-pay | Admitting: *Deleted

## 2020-04-21 MED ORDER — METFORMIN HCL ER 750 MG PO TB24: 750.0000 mg | ORAL_TABLET | Freq: Every day | ORAL | 1 refills | Status: DC

## 2020-06-07 DIAGNOSIS — H6121 Impacted cerumen, right ear: Secondary | ICD-10-CM | POA: Diagnosis not present

## 2020-06-25 DIAGNOSIS — M545 Low back pain: Secondary | ICD-10-CM | POA: Diagnosis not present

## 2020-07-15 ENCOUNTER — Other Ambulatory Visit: Payer: Self-pay

## 2020-07-15 ENCOUNTER — Encounter (HOSPITAL_COMMUNITY): Payer: Self-pay

## 2020-07-15 DIAGNOSIS — Z833 Family history of diabetes mellitus: Secondary | ICD-10-CM | POA: Diagnosis not present

## 2020-07-15 DIAGNOSIS — Z8249 Family history of ischemic heart disease and other diseases of the circulatory system: Secondary | ICD-10-CM | POA: Diagnosis not present

## 2020-07-15 DIAGNOSIS — K430 Incisional hernia with obstruction, without gangrene: Principal | ICD-10-CM | POA: Diagnosis present

## 2020-07-15 DIAGNOSIS — G610 Guillain-Barre syndrome: Secondary | ICD-10-CM | POA: Diagnosis present

## 2020-07-15 DIAGNOSIS — Z8042 Family history of malignant neoplasm of prostate: Secondary | ICD-10-CM | POA: Diagnosis not present

## 2020-07-15 DIAGNOSIS — R109 Unspecified abdominal pain: Secondary | ICD-10-CM | POA: Diagnosis not present

## 2020-07-15 DIAGNOSIS — Z7982 Long term (current) use of aspirin: Secondary | ICD-10-CM

## 2020-07-15 DIAGNOSIS — Z823 Family history of stroke: Secondary | ICD-10-CM | POA: Diagnosis not present

## 2020-07-15 DIAGNOSIS — Z20822 Contact with and (suspected) exposure to covid-19: Secondary | ICD-10-CM | POA: Diagnosis present

## 2020-07-15 DIAGNOSIS — E785 Hyperlipidemia, unspecified: Secondary | ICD-10-CM | POA: Diagnosis present

## 2020-07-15 DIAGNOSIS — Z4682 Encounter for fitting and adjustment of non-vascular catheter: Secondary | ICD-10-CM | POA: Diagnosis not present

## 2020-07-15 DIAGNOSIS — H905 Unspecified sensorineural hearing loss: Secondary | ICD-10-CM | POA: Diagnosis present

## 2020-07-15 DIAGNOSIS — K460 Unspecified abdominal hernia with obstruction, without gangrene: Secondary | ICD-10-CM | POA: Diagnosis not present

## 2020-07-15 DIAGNOSIS — Z832 Family history of diseases of the blood and blood-forming organs and certain disorders involving the immune mechanism: Secondary | ICD-10-CM

## 2020-07-15 DIAGNOSIS — Z79899 Other long term (current) drug therapy: Secondary | ICD-10-CM | POA: Diagnosis not present

## 2020-07-15 DIAGNOSIS — K436 Other and unspecified ventral hernia with obstruction, without gangrene: Secondary | ICD-10-CM | POA: Diagnosis not present

## 2020-07-15 DIAGNOSIS — Z87891 Personal history of nicotine dependence: Secondary | ICD-10-CM | POA: Diagnosis not present

## 2020-07-15 DIAGNOSIS — E119 Type 2 diabetes mellitus without complications: Secondary | ICD-10-CM | POA: Diagnosis present

## 2020-07-15 DIAGNOSIS — Z96653 Presence of artificial knee joint, bilateral: Secondary | ICD-10-CM | POA: Diagnosis present

## 2020-07-15 DIAGNOSIS — K6389 Other specified diseases of intestine: Secondary | ICD-10-CM | POA: Diagnosis not present

## 2020-07-15 DIAGNOSIS — K219 Gastro-esophageal reflux disease without esophagitis: Secondary | ICD-10-CM | POA: Diagnosis present

## 2020-07-15 DIAGNOSIS — R112 Nausea with vomiting, unspecified: Secondary | ICD-10-CM | POA: Diagnosis not present

## 2020-07-15 DIAGNOSIS — I10 Essential (primary) hypertension: Secondary | ICD-10-CM | POA: Diagnosis present

## 2020-07-15 DIAGNOSIS — Z8711 Personal history of peptic ulcer disease: Secondary | ICD-10-CM

## 2020-07-15 DIAGNOSIS — Z4659 Encounter for fitting and adjustment of other gastrointestinal appliance and device: Secondary | ICD-10-CM

## 2020-07-15 DIAGNOSIS — Z85820 Personal history of malignant melanoma of skin: Secondary | ICD-10-CM

## 2020-07-15 DIAGNOSIS — I4891 Unspecified atrial fibrillation: Secondary | ICD-10-CM | POA: Diagnosis not present

## 2020-07-15 DIAGNOSIS — R1031 Right lower quadrant pain: Secondary | ICD-10-CM | POA: Diagnosis not present

## 2020-07-15 LAB — COMPREHENSIVE METABOLIC PANEL
ALT: 29 U/L (ref 0–44)
AST: 24 U/L (ref 15–41)
Albumin: 4.5 g/dL (ref 3.5–5.0)
Alkaline Phosphatase: 73 U/L (ref 38–126)
Anion gap: 10 (ref 5–15)
BUN: 23 mg/dL (ref 8–23)
CO2: 25 mmol/L (ref 22–32)
Calcium: 9.4 mg/dL (ref 8.9–10.3)
Chloride: 99 mmol/L (ref 98–111)
Creatinine, Ser: 1.04 mg/dL (ref 0.61–1.24)
GFR calc Af Amer: >60 mL/min (ref >60)
GFR calc non Af Amer: >60 mL/min (ref >60)
Glucose, Bld: 163 mg/dL — ABNORMAL HIGH (ref 70–99)
Potassium: 4.3 mmol/L (ref 3.5–5.1)
Sodium: 134 mmol/L — ABNORMAL LOW (ref 135–145)
Total Bilirubin: 0.5 mg/dL (ref 0.3–1.2)
Total Protein: 7.5 g/dL (ref 6.5–8.1)

## 2020-07-15 LAB — CBC
HCT: 46.6 % (ref 39.0–52.0)
Hemoglobin: 15.7 g/dL (ref 13.0–17.0)
MCH: 31.2 pg (ref 26.0–34.0)
MCHC: 33.7 g/dL (ref 30.0–36.0)
MCV: 92.6 fL (ref 80.0–100.0)
Platelets: 194 10*3/uL (ref 150–400)
RBC: 5.03 MIL/uL (ref 4.22–5.81)
RDW: 13.7 % (ref 11.5–15.5)
WBC: 10.8 10*3/uL — ABNORMAL HIGH (ref 4.0–10.5)
nRBC: 0 % (ref 0.0–0.2)

## 2020-07-15 LAB — URINALYSIS, ROUTINE W REFLEX MICROSCOPIC
Bacteria, UA: NONE SEEN
Bilirubin Urine: NEGATIVE
Glucose, UA: NEGATIVE mg/dL
Hgb urine dipstick: NEGATIVE
Ketones, ur: NEGATIVE mg/dL
Leukocytes,Ua: NEGATIVE
Nitrite: NEGATIVE
Protein, ur: 30 mg/dL — AB
Specific Gravity, Urine: 1.017 (ref 1.005–1.030)
pH: 6 (ref 5.0–8.0)

## 2020-07-15 LAB — LIPASE, BLOOD: Lipase: 39 U/L (ref 11–51)

## 2020-07-15 NOTE — ED Notes (Signed)
Labeled urine specimen and culture sent to lab. [facility] 

## 2020-07-15 NOTE — ED Triage Notes (Signed)
Pt c/o RLQ abdominal pain since 2000 tonight. Pt sts he feels a knot. Denies N/V/D

## 2020-07-15 NOTE — ED Notes (Signed)
Pt provided w/labeled specimen cup for U/A collection. [employer]

## 2020-07-16 ENCOUNTER — Emergency Department (HOSPITAL_COMMUNITY): Payer: PPO

## 2020-07-16 ENCOUNTER — Observation Stay (HOSPITAL_COMMUNITY): Payer: PPO

## 2020-07-16 ENCOUNTER — Encounter (HOSPITAL_COMMUNITY): Payer: Self-pay

## 2020-07-16 ENCOUNTER — Encounter (HOSPITAL_COMMUNITY): Admission: EM | Disposition: A | Discharge status: Home / Self Care | Payer: Self-pay | Source: Home / Self Care

## 2020-07-16 ENCOUNTER — Inpatient Hospital Stay (HOSPITAL_COMMUNITY)
Admission: EM | Admit: 2020-07-16 | Discharge: 2020-07-18 | DRG: 354 | Disposition: A | Discharge status: Home / Self Care | Payer: PPO | Attending: General Surgery | Admitting: General Surgery

## 2020-07-16 ENCOUNTER — Observation Stay (HOSPITAL_COMMUNITY): Payer: PPO | Admitting: Anesthesiology

## 2020-07-16 DIAGNOSIS — Z4659 Encounter for fitting and adjustment of other gastrointestinal appliance and device: Secondary | ICD-10-CM

## 2020-07-16 DIAGNOSIS — K6389 Other specified diseases of intestine: Secondary | ICD-10-CM | POA: Diagnosis not present

## 2020-07-16 DIAGNOSIS — R1031 Right lower quadrant pain: Secondary | ICD-10-CM | POA: Diagnosis not present

## 2020-07-16 DIAGNOSIS — R112 Nausea with vomiting, unspecified: Secondary | ICD-10-CM | POA: Diagnosis not present

## 2020-07-16 DIAGNOSIS — K436 Other and unspecified ventral hernia with obstruction, without gangrene: Secondary | ICD-10-CM

## 2020-07-16 DIAGNOSIS — K430 Incisional hernia with obstruction, without gangrene: Secondary | ICD-10-CM | POA: Diagnosis present

## 2020-07-16 DIAGNOSIS — Z4682 Encounter for fitting and adjustment of non-vascular catheter: Secondary | ICD-10-CM | POA: Diagnosis not present

## 2020-07-16 HISTORY — PX: INGUINAL HERNIA REPAIR: SHX194

## 2020-07-16 HISTORY — DX: Unspecified malignant neoplasm of skin, unspecified: C44.90

## 2020-07-16 LAB — SARS CORONAVIRUS 2 BY RT PCR (HOSPITAL ORDER, PERFORMED IN ~~LOC~~ HOSPITAL LAB): SARS Coronavirus 2: NEGATIVE

## 2020-07-16 LAB — HEMOGLOBIN A1C
Hgb A1c MFr Bld: 6.6 % — ABNORMAL HIGH (ref 4.8–5.6)
Mean Plasma Glucose: 142.72 mg/dL

## 2020-07-16 LAB — TYPE AND SCREEN
ABO/RH(D): O POS
Antibody Screen: NEGATIVE

## 2020-07-16 LAB — GLUCOSE, CAPILLARY
Glucose-Capillary: 127 mg/dL — ABNORMAL HIGH (ref 70–99)
Glucose-Capillary: 168 mg/dL — ABNORMAL HIGH (ref 70–99)
Glucose-Capillary: 184 mg/dL — ABNORMAL HIGH (ref 70–99)

## 2020-07-16 SURGERY — REPAIR, HERNIA, INGUINAL, LAPAROSCOPIC
Anesthesia: General | Site: Abdomen

## 2020-07-16 MED ORDER — SODIUM CHLORIDE 0.9 % IV SOLN: INTRAVENOUS | Status: DC

## 2020-07-16 MED ORDER — LISINOPRIL 10 MG PO TABS
10.0000 mg | ORAL_TABLET | Freq: Every day | ORAL | Status: DC
  Administered 2020-07-17: 10 mg via ORAL
  Filled 2020-07-16: qty 1

## 2020-07-16 MED ORDER — ONDANSETRON HCL 4 MG/2ML IJ SOLN
4.0000 mg | Freq: Once | INTRAMUSCULAR | Status: AC
  Administered 2020-07-16: 4 mg via INTRAVENOUS
  Filled 2020-07-16: qty 2

## 2020-07-16 MED ORDER — LACTATED RINGERS IV SOLN: INTRAVENOUS | Status: DC | PRN

## 2020-07-16 MED ORDER — METOPROLOL TARTRATE 5 MG/5ML IV SOLN
INTRAVENOUS | Status: AC
  Filled 2020-07-16: qty 5

## 2020-07-16 MED ORDER — SODIUM CHLORIDE 0.9 % IV SOLN: Freq: Once | INTRAVENOUS | Status: AC

## 2020-07-16 MED ORDER — EPHEDRINE SULFATE-NACL 50-0.9 MG/10ML-% IV SOSY
PREFILLED_SYRINGE | INTRAVENOUS | Status: DC | PRN
  Administered 2020-07-16: 15 mg via INTRAVENOUS

## 2020-07-16 MED ORDER — ALBUMIN HUMAN 5 % IV SOLN: INTRAVENOUS | Status: DC | PRN

## 2020-07-16 MED ORDER — FENTANYL CITRATE (PF) 100 MCG/2ML IJ SOLN
100.0000 ug | Freq: Once | INTRAMUSCULAR | Status: AC
  Administered 2020-07-16: 100 ug via INTRAVENOUS
  Filled 2020-07-16: qty 2

## 2020-07-16 MED ORDER — PROPOFOL 10 MG/ML IV BOLUS
INTRAVENOUS | Status: DC | PRN
  Administered 2020-07-16: 200 mg via INTRAVENOUS

## 2020-07-16 MED ORDER — PANTOPRAZOLE SODIUM 40 MG IV SOLR
40.0000 mg | Freq: Two times a day (BID) | INTRAVENOUS | Status: DC
  Administered 2020-07-16 – 2020-07-18 (×4): 40 mg via INTRAVENOUS
  Filled 2020-07-16 (×4): qty 40

## 2020-07-16 MED ORDER — FENTANYL CITRATE (PF) 100 MCG/2ML IJ SOLN
100.0000 ug | Freq: Once | INTRAMUSCULAR | Status: DC
  Filled 2020-07-16: qty 2

## 2020-07-16 MED ORDER — DIPHENHYDRAMINE HCL 50 MG/ML IJ SOLN: 12.5000 mg | Freq: Four times a day (QID) | INTRAMUSCULAR | Status: DC | PRN

## 2020-07-16 MED ORDER — DIPHENHYDRAMINE HCL 50 MG/ML IJ SOLN
INTRAMUSCULAR | Status: DC | PRN
  Administered 2020-07-16: 12.5 mg via INTRAVENOUS

## 2020-07-16 MED ORDER — SIMETHICONE 80 MG PO CHEW: 40.0000 mg | CHEWABLE_TABLET | Freq: Four times a day (QID) | ORAL | Status: DC | PRN

## 2020-07-16 MED ORDER — ENOXAPARIN SODIUM 40 MG/0.4ML ~~LOC~~ SOLN
40.0000 mg | SUBCUTANEOUS | Status: DC
  Administered 2020-07-17 – 2020-07-18 (×2): 40 mg via SUBCUTANEOUS
  Filled 2020-07-16 (×2): qty 0.4

## 2020-07-16 MED ORDER — PHENYLEPHRINE HCL-NACL 10-0.9 MG/250ML-% IV SOLN
INTRAVENOUS | Status: DC | PRN
  Administered 2020-07-16: 45 ug/min via INTRAVENOUS

## 2020-07-16 MED ORDER — ROCURONIUM BROMIDE 10 MG/ML (PF) SYRINGE
PREFILLED_SYRINGE | INTRAVENOUS | Status: DC | PRN
  Administered 2020-07-16: 50 mg via INTRAVENOUS
  Administered 2020-07-16: 10 mg via INTRAVENOUS
  Administered 2020-07-16: 30 mg via INTRAVENOUS

## 2020-07-16 MED ORDER — IOHEXOL 300 MG/ML  SOLN
100.0000 mL | Freq: Once | INTRAMUSCULAR | Status: AC | PRN
  Administered 2020-07-16: 100 mL via INTRAVENOUS

## 2020-07-16 MED ORDER — FENTANYL CITRATE (PF) 250 MCG/5ML IJ SOLN
INTRAMUSCULAR | Status: AC
  Filled 2020-07-16: qty 5

## 2020-07-16 MED ORDER — ONDANSETRON 4 MG PO TBDP: 4.0000 mg | ORAL_TABLET | Freq: Four times a day (QID) | ORAL | Status: DC | PRN

## 2020-07-16 MED ORDER — CEFAZOLIN SODIUM-DEXTROSE 2-4 GM/100ML-% IV SOLN
2.0000 g | INTRAVENOUS | Status: AC
  Administered 2020-07-16: 2 g via INTRAVENOUS
  Filled 2020-07-16: qty 100

## 2020-07-16 MED ORDER — BUPIVACAINE-EPINEPHRINE (PF) 0.25% -1:200000 IJ SOLN
INTRAMUSCULAR | Status: DC | PRN
  Administered 2020-07-16: 30 mL

## 2020-07-16 MED ORDER — LIDOCAINE 2% (20 MG/ML) 5 ML SYRINGE
INTRAMUSCULAR | Status: DC | PRN
  Administered 2020-07-16: 100 mg via INTRAVENOUS

## 2020-07-16 MED ORDER — BUPIVACAINE-EPINEPHRINE (PF) 0.25% -1:200000 IJ SOLN
INTRAMUSCULAR | Status: AC
  Filled 2020-07-16: qty 30

## 2020-07-16 MED ORDER — LACTATED RINGERS IV SOLN: INTRAVENOUS | Status: DC

## 2020-07-16 MED ORDER — SUCCINYLCHOLINE CHLORIDE 200 MG/10ML IV SOSY
PREFILLED_SYRINGE | INTRAVENOUS | Status: DC | PRN
  Administered 2020-07-16: 160 mg via INTRAVENOUS
  Administered 2020-07-16: 40 mg via INTRAVENOUS

## 2020-07-16 MED ORDER — PROPOFOL 10 MG/ML IV BOLUS
INTRAVENOUS | Status: AC
  Filled 2020-07-16: qty 20

## 2020-07-16 MED ORDER — ACETAMINOPHEN 10 MG/ML IV SOLN
1000.0000 mg | Freq: Four times a day (QID) | INTRAVENOUS | Status: DC
  Administered 2020-07-16 – 2020-07-17 (×3): 1000 mg via INTRAVENOUS
  Filled 2020-07-16 (×4): qty 100

## 2020-07-16 MED ORDER — METHOCARBAMOL 1000 MG/10ML IJ SOLN
500.0000 mg | Freq: Three times a day (TID) | INTRAVENOUS | Status: DC
  Administered 2020-07-16 – 2020-07-17 (×2): 500 mg via INTRAVENOUS
  Filled 2020-07-16: qty 5
  Filled 2020-07-16 (×2): qty 500

## 2020-07-16 MED ORDER — DEXAMETHASONE SODIUM PHOSPHATE 10 MG/ML IJ SOLN
INTRAMUSCULAR | Status: AC
  Filled 2020-07-16: qty 1

## 2020-07-16 MED ORDER — METOPROLOL TARTRATE 5 MG/5ML IV SOLN: 5.0000 mg | Freq: Once | INTRAVENOUS | Status: DC

## 2020-07-16 MED ORDER — PANTOPRAZOLE SODIUM 40 MG IV SOLR
40.0000 mg | Freq: Once | INTRAVENOUS | Status: AC
  Administered 2020-07-16: 40 mg via INTRAVENOUS
  Filled 2020-07-16: qty 40

## 2020-07-16 MED ORDER — DEXAMETHASONE SODIUM PHOSPHATE 10 MG/ML IJ SOLN
INTRAMUSCULAR | Status: DC | PRN
  Administered 2020-07-16: 8 mg via INTRAVENOUS

## 2020-07-16 MED ORDER — SUGAMMADEX SODIUM 500 MG/5ML IV SOLN
INTRAVENOUS | Status: DC | PRN
  Administered 2020-07-16: 300 mg via INTRAVENOUS

## 2020-07-16 MED ORDER — FENTANYL CITRATE (PF) 100 MCG/2ML IJ SOLN
50.0000 ug | INTRAMUSCULAR | Status: DC | PRN
  Administered 2020-07-16 (×2): 50 ug via INTRAVENOUS
  Filled 2020-07-16 (×2): qty 2

## 2020-07-16 MED ORDER — FENTANYL CITRATE (PF) 100 MCG/2ML IJ SOLN: 25.0000 ug | INTRAMUSCULAR | Status: DC | PRN

## 2020-07-16 MED ORDER — INSULIN ASPART 100 UNIT/ML ~~LOC~~ SOLN: 0.0000 [IU] | Freq: Every day | SUBCUTANEOUS | Status: DC

## 2020-07-16 MED ORDER — LIDOCAINE VISCOUS HCL 2 % MT SOLN
15.0000 mL | Freq: Once | OROMUCOSAL | Status: AC
  Administered 2020-07-16: 15 mL via OROMUCOSAL
  Filled 2020-07-16: qty 15

## 2020-07-16 MED ORDER — LACTATED RINGERS IR SOLN
Status: DC | PRN
  Administered 2020-07-16: 1000 mL

## 2020-07-16 MED ORDER — FENTANYL CITRATE (PF) 100 MCG/2ML IJ SOLN
INTRAMUSCULAR | Status: DC | PRN
Start: 2020-07-16 — End: 2020-07-16
  Administered 2020-07-16 (×7): 50 ug via INTRAVENOUS

## 2020-07-16 MED ORDER — PROMETHAZINE HCL 25 MG/ML IJ SOLN: 6.2500 mg | INTRAMUSCULAR | Status: DC | PRN

## 2020-07-16 MED ORDER — ONDANSETRON HCL 4 MG/2ML IJ SOLN
INTRAMUSCULAR | Status: DC | PRN
  Administered 2020-07-16: 4 mg via INTRAVENOUS

## 2020-07-16 MED ORDER — INSULIN ASPART 100 UNIT/ML ~~LOC~~ SOLN
0.0000 [IU] | Freq: Four times a day (QID) | SUBCUTANEOUS | Status: DC
  Administered 2020-07-16: 4 [IU] via SUBCUTANEOUS
  Administered 2020-07-17 – 2020-07-18 (×4): 3 [IU] via SUBCUTANEOUS

## 2020-07-16 MED ORDER — ONDANSETRON HCL 4 MG/2ML IJ SOLN
INTRAMUSCULAR | Status: AC
  Filled 2020-07-16: qty 2

## 2020-07-16 MED ORDER — FENTANYL CITRATE (PF) 100 MCG/2ML IJ SOLN
INTRAMUSCULAR | Status: AC
  Filled 2020-07-16: qty 2

## 2020-07-16 MED ORDER — DIPHENHYDRAMINE HCL 12.5 MG/5ML PO ELIX: 12.5000 mg | ORAL_SOLUTION | Freq: Four times a day (QID) | ORAL | Status: DC | PRN

## 2020-07-16 MED ORDER — ACETAMINOPHEN 500 MG PO TABS: 1000.0000 mg | ORAL_TABLET | Freq: Once | ORAL | Status: DC

## 2020-07-16 MED ORDER — LABETALOL HCL 5 MG/ML IV SOLN
5.0000 mg | Freq: Once | INTRAVENOUS | Status: AC
  Administered 2020-07-16: 5 mg via INTRAVENOUS

## 2020-07-16 MED ORDER — ONDANSETRON HCL 4 MG/2ML IJ SOLN: 4.0000 mg | Freq: Four times a day (QID) | INTRAMUSCULAR | Status: DC | PRN

## 2020-07-16 MED ORDER — LABETALOL HCL 5 MG/ML IV SOLN
INTRAVENOUS | Status: AC
  Filled 2020-07-16: qty 4

## 2020-07-16 MED ORDER — ENOXAPARIN SODIUM 40 MG/0.4ML ~~LOC~~ SOLN: 40.0000 mg | Freq: Two times a day (BID) | SUBCUTANEOUS | Status: DC

## 2020-07-16 MED ORDER — CELECOXIB 200 MG PO CAPS: 200.0000 mg | ORAL_CAPSULE | Freq: Once | ORAL | Status: DC

## 2020-07-16 MED ORDER — MORPHINE SULFATE (PF) 2 MG/ML IV SOLN: 2.0000 mg | INTRAVENOUS | Status: DC | PRN

## 2020-07-16 MED ORDER — METOPROLOL TARTRATE 5 MG/5ML IV SOLN
5.0000 mg | Freq: Four times a day (QID) | INTRAVENOUS | Status: DC | PRN
  Administered 2020-07-17 – 2020-07-18 (×3): 5 mg via INTRAVENOUS
  Filled 2020-07-16 (×3): qty 5

## 2020-07-16 MED ORDER — 0.9 % SODIUM CHLORIDE (POUR BTL) OPTIME
TOPICAL | Status: DC | PRN
  Administered 2020-07-16: 1000 mL

## 2020-07-16 SURGICAL SUPPLY — 54 items
ADH SKN CLS APL DERMABOND .7 (GAUZE/BANDAGES/DRESSINGS) ×1
APL PRP STRL LF DISP 70% ISPRP (MISCELLANEOUS) ×1
APL SKNCLS STERI-STRIP NONHPOA (GAUZE/BANDAGES/DRESSINGS)
BENZOIN TINCTURE PRP APPL 2/3 (GAUZE/BANDAGES/DRESSINGS) ×1 IMPLANT
CABLE HIGH FREQUENCY MONO STRZ (ELECTRODE) ×2 IMPLANT
CHLORAPREP W/TINT 26 (MISCELLANEOUS) ×3 IMPLANT
CLOSURE WOUND 1/2 X4 (GAUZE/BANDAGES/DRESSINGS)
COVER SURGICAL LIGHT HANDLE (MISCELLANEOUS) ×3 IMPLANT
COVER WAND RF STERILE (DRAPES) IMPLANT
DECANTER SPIKE VIAL GLASS SM (MISCELLANEOUS) ×3 IMPLANT
DERMABOND ADVANCED (GAUZE/BANDAGES/DRESSINGS) ×2
DERMABOND ADVANCED .7 DNX12 (GAUZE/BANDAGES/DRESSINGS) IMPLANT
DEVICE SECURE STRAP 25 ABSORB (INSTRUMENTS) ×2 IMPLANT
DEVICE TROCAR PUNCTURE CLOSURE (ENDOMECHANICALS) ×2 IMPLANT
DISSECT BALLN SPACEMKR + OVL (BALLOONS) ×3
DISSECTOR BALLN SPACEMKR + OVL (BALLOONS) ×1 IMPLANT
ELECT REM PT RETURN 15FT ADLT (MISCELLANEOUS) ×3 IMPLANT
GLOVE BIOGEL PI IND STRL 7.0 (GLOVE) ×1 IMPLANT
GLOVE BIOGEL PI INDICATOR 7.0 (GLOVE) ×2
GLOVE SURG SYN 7.5  E (GLOVE) ×3
GLOVE SURG SYN 7.5 E (GLOVE) ×1 IMPLANT
GLOVE SURG SYN 7.5 PF PI (GLOVE) ×1 IMPLANT
GOWN SPEC L4 XLG W/TWL (GOWN DISPOSABLE) ×3 IMPLANT
GOWN STRL REUS W/ TWL XL LVL3 (GOWN DISPOSABLE) ×3 IMPLANT
GOWN STRL REUS W/TWL XL LVL3 (GOWN DISPOSABLE) ×9
KIT BASIN OR (CUSTOM PROCEDURE TRAY) ×3 IMPLANT
KIT TURNOVER KIT A (KITS) ×2 IMPLANT
MESH PARIETEX 4.7 (Mesh General) ×2 IMPLANT
NDL INSUFFLATION 14GA 120MM (NEEDLE) IMPLANT
NDL SPNL 22GX3.5 QUINCKE BK (NEEDLE) IMPLANT
NEEDLE INSUFFLATION 14GA 120MM (NEEDLE) ×3 IMPLANT
NEEDLE SPNL 22GX3.5 QUINCKE BK (NEEDLE) ×3 IMPLANT
SCISSORS LAP 5X35 DISP (ENDOMECHANICALS) ×2 IMPLANT
SET IRRIG TUBING LAPAROSCOPIC (IRRIGATION / IRRIGATOR) ×2 IMPLANT
SET TUBE SMOKE EVAC HIGH FLOW (TUBING) ×3 IMPLANT
SHEARS HARMONIC ACE PLUS 36CM (ENDOMECHANICALS) ×2 IMPLANT
SLEEVE ADV FIXATION 5X100MM (TROCAR) ×2 IMPLANT
SLEEVE XCEL OPT CAN 5 100 (ENDOMECHANICALS) ×1 IMPLANT
SOL ANTI FOG 6CC (MISCELLANEOUS) ×1 IMPLANT
SOLUTION ANTI FOG 6CC (MISCELLANEOUS) ×2
STAPLER VISISTAT (STAPLE) ×2 IMPLANT
STRIP CLOSURE SKIN 1/2X4 (GAUZE/BANDAGES/DRESSINGS) ×1 IMPLANT
SUT MNCRL AB 4-0 PS2 18 (SUTURE) ×3 IMPLANT
SUT NOVA 0 T19/GS 22DT (SUTURE) ×4 IMPLANT
SUT VIC AB 2-0 SH 27 (SUTURE) ×3
SUT VIC AB 2-0 SH 27X BRD (SUTURE) IMPLANT
SUT VIC AB 3-0 SH 18 (SUTURE) ×3 IMPLANT
SUT VIC AB 5-0 PS2 18 (SUTURE) IMPLANT
SUT VICRYL 0 UR6 27IN ABS (SUTURE) ×2 IMPLANT
TOWEL OR 17X26 10 PK STRL BLUE (TOWEL DISPOSABLE) ×3 IMPLANT
TRAY LAPAROSCOPIC (CUSTOM PROCEDURE TRAY) ×3 IMPLANT
TROCAR ADV FIXATION 5X100MM (TROCAR) ×2 IMPLANT
TROCAR BLADELESS OPT 5 100 (ENDOMECHANICALS) ×1 IMPLANT
TROCAR XCEL BLUNT TIP 100MML (ENDOMECHANICALS) ×2 IMPLANT

## 2020-07-16 NOTE — ED Provider Notes (Signed)
[facility] DEPT Provider Note: Georgena Spurling, MD, FACEP  CSN: [id number] MRN: [id number] ARRIVAL: 07/15/20 at 2253 ROOM: WA10/WA10   CHIEF COMPLAINT  Abdominal Pain   HISTORY OF PRESENT ILLNESS  07/16/20 12:23 AM Troy Garrison is a 79 y.o. male with right lower quadrant abdominal pain that came on suddenly about 7:45 PM yesterday evening.  He rates the pain as a 8 out of 10, sharp in nature.  He feels an associated right lower quadrant mass which she has never felt before.  It does not extend into his groin.  He has associated acid reflux as well as nausea but no vomiting or diarrhea.  He denies any urinary changes.  Pain is worse with palpation or movement.   Past Medical History:  Diagnosis Date  . Atrial flutter ([place])    S/P RFCA with Dr. Caryl Comes 6/12  . Bursitis of shoulder   . GERD (gastroesophageal reflux disease)   . Hearing loss, neural    Left ear  . Hemorrhoids   . History of Guillain-Barre syndrome 2003  . Hyperlipidemia   . Injury of tendon of long head of biceps   . Left axis deviation 1999  . Osteoarthritis   . Peptic ulcer 1964   with bleed ; transfused  . Prediabetes 07/02/2008  . Skin cancer     Past Surgical History:  Procedure Laterality Date  . ABLATION OF DYSRHYTHMIC FOCUS  2012    for AF;Dr Caryl Comes  . colonoscopy with polypectomy  2011   Internal hemorrhoids; [facility]  . INTRAOCULAR LENS INSERTION    . MELANOMA EXCISION     on scalp January 2019  . REPLACEMENT TOTAL KNEE  2000   Left Knee  . TOTAL KNEE ARTHROPLASTY  09/21/2011   Procedure: TOTAL KNEE ARTHROPLASTY;  Surgeon: Tobi Bastos;  Location: [facility];  Service: Orthopedics;  Laterality: Right;  Marland Kitchen VASECTOMY     Scar tissue removed 2 years after the vasectomy    Family History  Problem Relation Age of Onset  . Heart attack Father 54  . Heart failure Father        Died @ 73  . Lupus Mother   . Heart attack Brother 56       MI @ 4;.CABG @ 2  . Diabetes Brother   .  Prostate cancer Paternal Grandfather        46  . Cancer Maternal Uncle        X2, unknown primary  . Stroke Maternal Grandmother        > 65  . Peripheral vascular disease Brother        S/P leg amputation  . Colon cancer Neg Hx   . Stomach cancer Neg Hx     Social History   Tobacco Use  . Smoking status: Former Smoker    Quit date: 10/24/1992    Years since quitting: 27.7  . Smokeless tobacco: Never Used  . Tobacco comment: smoked 1960-1994, up to < 1 ppd  Substance Use Topics  . Alcohol use: Yes    Comment: wine  . Drug use: No    Prior to Admission medications   Medication Sig Start Date End Date Taking? Authorizing Provider  aspirin 325 MG EC tablet Take 325 mg by mouth daily.   Yes [provider]  esomeprazole (NEXIUM) 40 MG capsule Take 1 capsule (40 mg total) by mouth daily as needed. 07/24/17  Yes Vivi Barrack, MD  ibuprofen (ADVIL,MOTRIN) 200 MG  tablet Take 200 mg by mouth as needed.   Yes [provider]  lisinopril (ZESTRIL) 10 MG tablet Take 1 tablet (10 mg total) by mouth daily. 12/16/19  Yes Vivi Barrack, MD  Melatonin 10 MG CAPS Take 1 capsule by mouth daily as needed.    Yes [provider]  metFORMIN (GLUCOPHAGE XR) 750 MG 24 hr tablet Take 1 tablet (750 mg total) by mouth daily with breakfast. 04/21/20  Yes Vivi Barrack, MD  Multiple Vitamin (MULTIVITAMIN) tablet Take 1 tablet by mouth daily.     Yes [provider]  Omega-3 Fatty Acids (FISH OIL) 500 MG CAPS Take 1,000 mg by mouth once.    Yes [provider]  pravastatin (PRAVACHOL) 40 MG tablet Take 0.5 tablets (20 mg total) by mouth daily. 12/16/19  Yes Vivi Barrack, MD    Allergies Patient has no known allergies.   REVIEW OF SYSTEMS  Negative except as noted here or in the History of Present Illness.   PHYSICAL EXAMINATION  Initial Vital Signs Blood pressure (!) 215/92, pulse 88, temperature 98.4 F (36.9 C), temperature source Oral, resp.  rate 16, height 6' (1.829 m), weight 120.2 kg, SpO2 98 %.  Examination General: Well-developed, well-nourished male in no acute distress; appearance consistent with age of record HENT: normocephalic; atraumatic Eyes: pupils equal, round and reactive to light; extraocular muscles intact; lens implants Neck: supple Heart: regular rate and rhythm Lungs: clear to auscultation bilaterally Abdomen: soft; nondistended; tender right lower quadrant mass; bowel sounds present Extremities: No deformity; full range of motion; pulses normal Neurologic: Awake, alert and oriented; motor function intact in all extremities and symmetric; no facial droop Skin: Warm and dry Psychiatric: Normal mood and affect   RESULTS  Summary of this visit's results, reviewed and interpreted by myself:   EKG Interpretation  Date/Time:    Ventricular Rate:    PR Interval:    QRS Duration:   QT Interval:    QTC Calculation:   R Axis:     Text Interpretation:        Laboratory Studies: Results for orders placed or performed during the hospital encounter of 07/16/20 (from the past 24 hour(s))  Lipase, blood     Status: None   Collection Time: 07/15/20 11:12 PM  Result Value Ref Range   Lipase 39 11 - 51 U/L  Comprehensive metabolic panel     Status: Abnormal   Collection Time: 07/15/20 11:12 PM  Result Value Ref Range   Sodium 134 (L) 135 - 145 mmol/L   Potassium 4.3 3.5 - 5.1 mmol/L   Chloride 99 98 - 111 mmol/L   CO2 25 22 - 32 mmol/L   Glucose, Bld 163 (H) 70 - 99 mg/dL   BUN 23 8 - 23 mg/dL   Creatinine, Ser 1.04 0.61 - 1.24 mg/dL   Calcium 9.4 8.9 - 10.3 mg/dL   Total Protein 7.5 6.5 - 8.1 g/dL   Albumin 4.5 3.5 - 5.0 g/dL   AST 24 15 - 41 U/L   ALT 29 0 - 44 U/L   Alkaline Phosphatase 73 38 - 126 U/L   Total Bilirubin 0.5 0.3 - 1.2 mg/dL   GFR calc non Af Amer >60 >60 mL/min   GFR calc Af Amer >60 >60 mL/min   Anion gap 10 5 - 15  CBC     Status: Abnormal   Collection Time: 07/15/20 11:12  PM  Result Value Ref Range   WBC 10.8 (  H) 4.0 - 10.5 K/uL   RBC 5.03 4.22 - 5.81 MIL/uL   Hemoglobin 15.7 13.0 - 17.0 g/dL   HCT 46.6 39 - 52 %   MCV 92.6 80.0 - 100.0 fL   MCH 31.2 26.0 - 34.0 pg   MCHC 33.7 30.0 - 36.0 g/dL   RDW 13.7 11.5 - 15.5 %   Platelets 194 150 - 400 K/uL   nRBC 0.0 0.0 - 0.2 %  Urinalysis, Routine w reflex microscopic Urine, Clean Catch     Status: Abnormal   Collection Time: 07/15/20 11:12 PM  Result Value Ref Range   Color, Urine YELLOW YELLOW   APPearance CLEAR CLEAR   Specific Gravity, Urine 1.017 1.005 - 1.030   pH 6.0 5.0 - 8.0   Glucose, UA NEGATIVE NEGATIVE mg/dL   Hgb urine dipstick NEGATIVE NEGATIVE   Bilirubin Urine NEGATIVE NEGATIVE   Ketones, ur NEGATIVE NEGATIVE mg/dL   Protein, ur 30 (A) NEGATIVE mg/dL   Nitrite NEGATIVE NEGATIVE   Leukocytes,Ua NEGATIVE NEGATIVE   RBC / HPF 0-5 0 - 5 RBC/hpf   WBC, UA 0-5 0 - 5 WBC/hpf   Bacteria, UA NONE SEEN NONE SEEN   Mucus PRESENT    Imaging Studies: CT ABDOMEN PELVIS W CONTRAST  Result Date: 07/16/2020 CLINICAL DATA:  Right lower quadrant abdominal pain EXAM: CT ABDOMEN AND PELVIS WITH CONTRAST TECHNIQUE: Multidetector CT imaging of the abdomen and pelvis was performed using the standard protocol following bolus administration of intravenous contrast. CONTRAST:  173mL OMNIPAQUE IOHEXOL 300 MG/ML  SOLN COMPARISON:  None. FINDINGS: Lower chest: The visualized lung bases are clear bilaterally. Extensive multi-vessel coronary artery calcification. Moderate calcification of the mitral valve annulus. Global cardiac size within normal limits. No pericardial effusion. Hepatobiliary: The gallbladder is mildly distended and mild pericholecystic inflammatory changes developed since prior examination. Together, the findings are suggestive of acute cholecystitis. The liver is unremarkable. No intra or extrahepatic biliary ductal dilation. Pancreas: Unremarkable Spleen: Unremarkable Adrenals/Urinary Tract: Adrenal  glands are unremarkable. Kidneys are normal, without renal calculi, focal lesion, or hydronephrosis. Bladder is unremarkable. Stomach/Bowel: The stomach is mildly distended. The proximal and mid small bowel is mildly dilated and fluid-filled to the point of bowel herniation into a right lower quadrant abdominal wall hernia. This is best seen on axial image # 73 and sagittal image # 56. Bowel within the hernia sac is mildly fluid distended. Distally, the small bowel is decompressed. The large bowel is unremarkable. Appendix normal. No free intraperitoneal gas or fluid. Vascular/Lymphatic: Moderate aortoiliac atherosclerotic calcification without evidence of aneurysm. No pathologic adenopathy within the abdomen and pelvis. Reproductive: Prostate is unremarkable. Other: Rectum unremarkable. Musculoskeletal: Advanced degenerative changes are seen within the lumbar spine. No lytic or blastic bone lesions are seen. IMPRESSION: Small-bowel obstruction secondary to herniated bowel within a right lower quadrant abdominal wall hernia. Pericholecystic inflammatory change suggestive of acute cholecystitis. Correlation with liver enzymes and possible hepatic biliary scintigraphy may be helpful to confirm this. Aortic Atherosclerosis (ICD10-I70.0). Electronically Signed   By: Fidela Salisbury MD   On: 07/16/2020 02:49    ED COURSE and MDM  Nursing notes, initial and subsequent vitals signs, including pulse oximetry, reviewed and interpreted by myself.  Vitals:   07/15/20 2301 07/16/20 0114 07/16/20 0208  BP: (!) 215/92 (!) 192/90 (!) 199/100  Pulse: 88 80 83  Resp: 16 18 18   Temp: 98.4 F (36.9 C) 98.3 F (36.8 C)   TempSrc: Oral    SpO2: 98% 99% 95%  Weight: 120.2 kg    Height: 6' (1.829 m)     Medications  0.9 %  sodium chloride infusion (has no administration in time range)  ondansetron (ZOFRAN) injection 4 mg (4 mg Intravenous Given 07/16/20 0200)  fentaNYL (SUBLIMAZE) injection 100 mcg (100 mcg Intravenous  Given 07/16/20 0202)  pantoprazole (PROTONIX) injection 40 mg (40 mg Intravenous Given 07/16/20 0204)  iohexol (OMNIPAQUE) 300 MG/ML solution 100 mL (100 mLs Intravenous Contrast Given 07/16/20 0212)   3:01 AM Discussed with Dr. Marcello Moores of general surgery who will see the patient in the ED.  He was advised of CT findings and likelihood of requiring surgery.  He has been made n.p.o.  Because he has not been vomiting an NG tube has not yet been placed.  He does not have right upper quadrant tenderness so I question a diagnosis of cholecystitis.  5:53 AM Patient now complaining of distention and increased pain.  We will place an NG tube to decompress his abdomen.  6:33 AM Nursing staff placing NG tube.  PROCEDURES  Procedures  CRITICAL CARE Performed by: Karen Chafe Eriyanna Kofoed Total critical care time: 30 minutes Critical care time was exclusive of separately billable procedures and treating other patients. Critical care was necessary to treat or prevent imminent or life-threatening deterioration. Critical care was time spent personally by me on the following activities: development of treatment plan with patient and/or surrogate as well as nursing, discussions with consultants, evaluation of patient's response to treatment, examination of patient, obtaining history from patient or surrogate, ordering and performing treatments and interventions, ordering and review of laboratory studies, ordering and review of radiographic studies, pulse oximetry and re-evaluation of patient's condition.   ED DIAGNOSES     ICD-10-CM   1. Obstruction concurrent with and due to hernia of abdominal wall  K43.6        Talena Neira, Jenny Reichmann, MD 07/16/20 [phone]

## 2020-07-16 NOTE — Op Note (Addendum)
07/16/2020  1:53 PM  PATIENT:  Troy Garrison, 79 y.o., male, MRN: [id number]  PREOP DIAGNOSIS:  Incarcerated right abdominal wall hernia, possible cholecystitis  POSTOP DIAGNOSIS:   Incarcerated right lower quadrant abdominal wall hernia, normal appearing gall bladder, evidence of prior tattoo the right transverse colon  PROCEDURE:   Procedure(s):  DIAGNOSTIC LAPAROSCOPY, LAPAROSCOPIC ABDOMINAL WALL HERNIA REPAIR WITH MESH [place] Scrape are photos at the end of the dictation]  SURGEON:   Alphonsa Overall, M.D.  ASSISTANT:   Modena Jansky, PA  ANESTHESIA:   general  Anesthesiologist: Duane Boston, MD CRNA: Lavina Hamman, CRNA; Cynda Familia, CRNA  General  EBL:  minimal  ml  BLOOD ADMINISTERED: none  DRAINS: none   LOCAL MEDICATIONS USED:   30 cc of 1/4% marcaine  SPECIMEN:   none  COUNTS CORRECT:  YES  INDICATIONS FOR PROCEDURE:  Troy Garrison is a 79 y.o. (DOB: 04-Oct-1941) white male whose primary care physician is Vivi Barrack, MD and comes for incarcerated right lower quadrant abdominal wall hernia and possible cholecystitis.   The indications and risks of the surgery were explained to the patient.  The risks include, but are not limited to, infection, bleeding, and nerve injury.  PROCEDURE: The patient was taken to room #4 at [facility].  He underwent a general anesthesia.  His left arm was tucked.  His abdomen was prepped with ChloraPrep and sterilely draped.  A timeout was held and surgical checklist run.  I accessed his abdominal cavity with a 12 mm Hassan trocar at the infraumbilical incision.  I placed 3 additional trochars.  A 5 mm trocar in the lower mid abdomen, a 5 mm trocar in the right upper abdomen, and a 5 mm trocar in the subxiphoid location.  I carried out an abdominal exploration.  There was a concern on the preop CT scan of him having cholecystitis, but I was able to visualize the gallbladder and this appeared normal.  He did have  evidence of prior tattooing of his right transverse colon.  His small bowel was distended consistent with a small bowel obstruction.  I then focused on the right lower quadrant where he had a knuckle of small bowel in the right lower quadrant abdominal wall hernia.  I was able to reduce this hernia.  The small bowel was hemorrhagic but viable.  There was peristalsis in the segment of bowel.  I watched this small bowel for several minutes before proceeding with the operation.  Identified the fascial defect in the right lower quadrant.  I reduce the hernia sac and excised this with harmonic scalpel.  I found the fascial edges and closed this with 3 interrupted 2-0 Novafil sutures using an Endo Catch.  I then placed a 12 cm circular Pariatex mesh to cover the defect.  I had 4 holding sutures of 0 Novafil that I passed transfascially.  The inferior transfascial suture broke.  I placed 2 more transfascial sutures of 0 Novafil, for a total of 5 sutures.  I then tacked the mesh to the right lower anterior abdominal wall with a secure strap.  The mesh repair cover the abdominal wall defect.  I tried to pull omentum down to the right lower quadrant but it would not come down easily.  I closed umbilical port with a 0 Vicryl suture.  I placed 2 additional 0 Vicryl sutures and umbilical port.  I infiltrated all the port sites with 30 cc of quarter percent Marcaine.  The trochars were  then removed without difficulty.   Sponge and needle count were correct.  The skin at each port was closed with a 4 Monocryl suture and pain with Dermabond.  The patient tolerated procedure well transferred recovery in good condition.   Left upper picture:  Normal appearing gall bladder Right upper picture:  Ecchymotic small bowel reduced from hernia Left lower picture:  Hernia sac Right lower picture:  Fascial edges of hernia  Photo of mesh repair.  Type of repair - Mesh  (choices - primary suture, mesh, or component)  Name  of mesh - Parietex  Size of mesh - Length 12 cm, Width 12 cm (circular)  Mesh overlap - 5 cm  Placement of mesh - beneath fascia and into peritoneal cavity  (choices - beneath fascia and into peritoneal cavity, beneath fascia but external to peritoneal cavity, between the muscle and fascia, above or external to fascia)  Alphonsa Overall, MD, [facility] Office phone:  [phone]

## 2020-07-16 NOTE — Transfer of Care (Signed)
Immediate Anesthesia Transfer of Care Note  Patient: Troy Garrison  Procedure(s) Performed: DIAGNOSTIC LAPAROSCOPY, LAPAROSCOPIC ABDOMINAL WALL HERNIA REPAIR WITH MESH (Abdomen)  Patient Location: PACU  Anesthesia Type:General  Level of Consciousness: sedated  Airway & Oxygen Therapy: Patient Spontanous Breathing and Patient connected to face mask oxygen  Post-op Assessment: Report given to RN and Post -op Vital signs reviewed and stable  Post vital signs: Reviewed and stable  Last Vitals:  Vitals Value Taken Time  BP 195/90 07/16/20 1358  Temp    Pulse 89 07/16/20 1401  Resp    SpO2 100 % 07/16/20 1401  Vitals shown include unvalidated device data.  Last Pain:  Vitals:   07/16/20 0936  TempSrc: Axillary  PainSc:       Patients Stated Pain Goal: 0 (47/82/[id number])  Complications: No complications documented.

## 2020-07-16 NOTE — Plan of Care (Signed)
  Problem: Education: Goal: Knowledge of General Education information will improve Description: Including pain rating scale, medication(s)/side effects and non-pharmacologic comfort measures Outcome: Progressing   Problem: Clinical Measurements: Goal: Will remain free from infection Outcome: Progressing   Problem: Coping: Goal: Level of anxiety will decrease Outcome: Progressing   

## 2020-07-16 NOTE — Anesthesia Postprocedure Evaluation (Signed)
Anesthesia Post Note  Patient: Troy Garrison  Procedure(s) Performed: DIAGNOSTIC LAPAROSCOPY, LAPAROSCOPIC ABDOMINAL WALL HERNIA REPAIR WITH MESH (Abdomen)     Patient location during evaluation: PACU Anesthesia Type: General Level of consciousness: sedated Pain management: pain level controlled Vital Signs Assessment: post-procedure vital signs reviewed and stable Respiratory status: spontaneous breathing and respiratory function stable Cardiovascular status: stable Postop Assessment: no apparent nausea or vomiting Anesthetic complications: no   No complications documented.  Last Vitals:  Vitals:   07/16/20 1515 07/16/20 1530  BP: (!) 175/87 (!) 176/90  Pulse: 80 87  Resp:    Temp:  (!) 36.3 C  SpO2: 93% 94%    Last Pain:  Vitals:   07/16/20 1515  TempSrc:   PainSc: Asleep                 Zamya Culhane DANIEL

## 2020-07-16 NOTE — Anesthesia Procedure Notes (Signed)
Procedure Name: Intubation Date/Time: 07/16/2020 11:50 AM Performed by: Lavina Hamman, CRNA Pre-anesthesia Checklist: Patient identified, Emergency Drugs available, Suction available and Patient being monitored Patient Re-evaluated:Patient Re-evaluated prior to induction Oxygen Delivery Method: Circle System Utilized Preoxygenation: Pre-oxygenation with 100% oxygen Induction Type: IV induction and Rapid sequence Laryngoscope Size: Mac, Glidescope and 4 Grade View: Grade II Tube type: Oral Tube size: 7.5 mm Number of attempts: 2 Airway Equipment and Method: Stylet and Oral airway Placement Confirmation: ETT inserted through vocal cords under direct vision,  positive ETCO2 and breath sounds checked- equal and bilateral Secured at: 23 cm Tube secured with: Tape Dental Injury: Teeth and Oropharynx as per pre-operative assessment  Difficulty Due To: Difficult Airway- due to immobile epiglottis and Difficult Airway- due to anterior larynx Comments: Initial DL w/ MAC 4 grade III view, glidescope size 4 w/ grade II view.

## 2020-07-16 NOTE — H&P (Signed)
CC: nausea, vomiting  Requesting provider: Dr Florina Ou  HPI: Troy Garrison is an 79 y.o. male who is here for nausea, vomiting and RLQ pain since last night.  He has never had anything like this before.    Past Medical History:  Diagnosis Date   Atrial flutter ([place])    S/P RFCA with Dr. Caryl Comes 6/12   Bursitis of shoulder    GERD (gastroesophageal reflux disease)    Hearing loss, neural    Left ear   Hemorrhoids    History of Guillain-Barre syndrome 2003   Hyperlipidemia    Injury of tendon of long head of biceps    Left axis deviation 1999   Osteoarthritis    Peptic ulcer 1964   with bleed ; transfused   Prediabetes 07/02/2008   Skin cancer     Past Surgical History:  Procedure Laterality Date   ABLATION OF DYSRHYTHMIC FOCUS  2012    for AF;Dr Caryl Comes   colonoscopy with polypectomy  2011   Internal hemorrhoids; [facility]   INTRAOCULAR LENS INSERTION     MELANOMA EXCISION     on scalp January 2019   REPLACEMENT TOTAL KNEE  2000   Left Knee   TOTAL KNEE ARTHROPLASTY  09/21/2011   Procedure: TOTAL KNEE ARTHROPLASTY;  Surgeon: Tobi Bastos;  Location: [facility];  Service: Orthopedics;  Laterality: Right;   VASECTOMY     Scar tissue removed 2 years after the vasectomy    Family History  Problem Relation Age of Onset   Heart attack Father 6   Heart failure Father        Died @ 72   Lupus Mother    Heart attack Brother 28       MI @ 47;.CABG @ 65   Diabetes Brother    Prostate cancer Paternal Grandfather        69   Cancer Maternal Uncle        X2, unknown primary   Stroke Maternal Grandmother        > 65   Peripheral vascular disease Brother        S/P leg amputation   Colon cancer Neg Hx    Stomach cancer Neg Hx     Social:  reports that he quit smoking about 27 years ago. He has never used smokeless tobacco. He reports current alcohol use. He reports that he does not use drugs.  Allergies: No Known  Allergies  Medications: I have reviewed the patient's current medications.  Results for orders placed or performed during the hospital encounter of 07/16/20 (from the past 48 hour(s))  Lipase, blood     Status: None   Collection Time: 07/15/20 11:12 PM  Result Value Ref Range   Lipase 39 11 - 51 U/L    Comment: Performed at [facility], [place] [address], [place], [place] [address]  Comprehensive metabolic panel     Status: Abnormal   Collection Time: 07/15/20 11:12 PM  Result Value Ref Range   Sodium 134 (L) 135 - 145 mmol/L   Potassium 4.3 3.5 - 5.1 mmol/L   Chloride 99 98 - 111 mmol/L   CO2 25 22 - 32 mmol/L   Glucose, Bld 163 (H) 70 - 99 mg/dL    Comment: Glucose reference range applies only to samples taken after fasting for at least 8 hours.   BUN 23 8 - 23 mg/dL   Creatinine, Ser 1.04 0.61 - 1.24 mg/dL   Calcium 9.4  8.9 - 10.3 mg/dL   Total Protein 7.5 6.5 - 8.1 g/dL   Albumin 4.5 3.5 - 5.0 g/dL   AST 24 15 - 41 U/L   ALT 29 0 - 44 U/L   Alkaline Phosphatase 73 38 - 126 U/L   Total Bilirubin 0.5 0.3 - 1.2 mg/dL   GFR calc non Af Amer >60 >60 mL/min   GFR calc Af Amer >60 >60 mL/min   Anion gap 10 5 - 15    Comment: Performed at [facility], [place] [address], [place], [place] [address]  CBC     Status: Abnormal   Collection Time: 07/15/20 11:12 PM  Result Value Ref Range   WBC 10.8 (H) 4.0 - 10.5 K/uL   RBC 5.03 4.22 - 5.81 MIL/uL   Hemoglobin 15.7 13.0 - 17.0 g/dL   HCT 46.6 39 - 52 %   MCV 92.6 80.0 - 100.0 fL   MCH 31.2 26.0 - 34.0 pg   MCHC 33.7 30.0 - 36.0 g/dL   RDW 13.7 11.5 - 15.5 %   Platelets 194 150 - 400 K/uL   nRBC 0.0 0.0 - 0.2 %    Comment: Performed at [facility], [place] [address] [place], [place] [address]  Urinalysis, Routine w reflex microscopic Urine, Clean Catch     Status: Abnormal   Collection Time: 07/15/20 11:12 PM  Result Value Ref Range   Color, Urine YELLOW YELLOW   APPearance  CLEAR CLEAR   Specific Gravity, Urine 1.017 1.005 - 1.030   pH 6.0 5.0 - 8.0   Glucose, UA NEGATIVE NEGATIVE mg/dL   Hgb urine dipstick NEGATIVE NEGATIVE   Bilirubin Urine NEGATIVE NEGATIVE   Ketones, ur NEGATIVE NEGATIVE mg/dL   Protein, ur 30 (A) NEGATIVE mg/dL   Nitrite NEGATIVE NEGATIVE   Leukocytes,Ua NEGATIVE NEGATIVE   RBC / HPF 0-5 0 - 5 RBC/hpf   WBC, UA 0-5 0 - 5 WBC/hpf   Bacteria, UA NONE SEEN NONE SEEN   Mucus PRESENT     Comment: Performed at [facility] [place] [address], [place], [place] [address]  SARS Coronavirus 2 by RT PCR (hospital order, performed in [facility] hospital lab) Nasopharyngeal Nasopharyngeal Swab     Status: None   Collection Time: 07/16/20  2:54 AM   Specimen: Nasopharyngeal Swab  Result Value Ref Range   SARS Coronavirus 2 NEGATIVE NEGATIVE    Comment: (NOTE) SARS-CoV-2 target nucleic acids are NOT DETECTED.  The SARS-CoV-2 RNA is generally detectable in upper and lower respiratory specimens during the acute phase of infection. The lowest concentration of SARS-CoV-2 viral copies this assay can detect is 250 copies / mL. A negative result does not preclude SARS-CoV-2 infection and should not be used as the sole basis for treatment or other patient management decisions.  A negative result may occur with improper specimen collection / handling, submission of specimen other than nasopharyngeal swab, presence of viral mutation(s) within the areas targeted by this assay, and inadequate number of viral copies (<250 copies / mL). A negative result must be combined with clinical observations, patient history, and epidemiological information.  Fact Sheet for Patients:   StrictlyIdeas.no  Fact Sheet for Healthcare Providers: BankingDealers.co.za  This test is not yet approved or  cleared by the [place] FDA and has been authorized for detection and/or diagnosis of SARS-CoV-2  by FDA under an Emergency Use Authorization (EUA).  This EUA will remain in effect (meaning this test can  be used) for the duration of the COVID-19 declaration under Section 564(b)(1) of the Act, 21 U.S.C. section 360bbb-3(b)(1), unless the authorization is terminated or revoked sooner.  Performed at [facility], [place] [address], [place], [place] [address]     CT ABDOMEN PELVIS W CONTRAST  Result Date: 07/16/2020 CLINICAL DATA:  Right lower quadrant abdominal pain EXAM: CT ABDOMEN AND PELVIS WITH CONTRAST TECHNIQUE: Multidetector CT imaging of the abdomen and pelvis was performed using the standard protocol following bolus administration of intravenous contrast. CONTRAST:  126mL OMNIPAQUE IOHEXOL 300 MG/ML  SOLN COMPARISON:  None. FINDINGS: Lower chest: The visualized lung bases are clear bilaterally. Extensive multi-vessel coronary artery calcification. Moderate calcification of the mitral valve annulus. Global cardiac size within normal limits. No pericardial effusion. Hepatobiliary: The gallbladder is mildly distended and mild pericholecystic inflammatory changes developed since prior examination. Together, the findings are suggestive of acute cholecystitis. The liver is unremarkable. No intra or extrahepatic biliary ductal dilation. Pancreas: Unremarkable Spleen: Unremarkable Adrenals/Urinary Tract: Adrenal glands are unremarkable. Kidneys are normal, without renal calculi, focal lesion, or hydronephrosis. Bladder is unremarkable. Stomach/Bowel: The stomach is mildly distended. The proximal and mid small bowel is mildly dilated and fluid-filled to the point of bowel herniation into a right lower quadrant abdominal wall hernia. This is best seen on axial image # 73 and sagittal image # 56. Bowel within the hernia sac is mildly fluid distended. Distally, the small bowel is decompressed. The large bowel is unremarkable. Appendix normal. No free intraperitoneal gas or fluid.  Vascular/Lymphatic: Moderate aortoiliac atherosclerotic calcification without evidence of aneurysm. No pathologic adenopathy within the abdomen and pelvis. Reproductive: Prostate is unremarkable. Other: Rectum unremarkable. Musculoskeletal: Advanced degenerative changes are seen within the lumbar spine. No lytic or blastic bone lesions are seen. IMPRESSION: Small-bowel obstruction secondary to herniated bowel within a right lower quadrant abdominal wall hernia. Pericholecystic inflammatory change suggestive of acute cholecystitis. Correlation with liver enzymes and possible hepatic biliary scintigraphy may be helpful to confirm this. Aortic Atherosclerosis (ICD10-I70.0). Electronically Signed   By: Fidela Salisbury MD   On: 07/16/2020 02:49    ROS - all of the below systems have been reviewed with the patient and positives are indicated with bold text General: chills, fever or night sweats Eyes: blurry vision or double vision ENT: epistaxis or sore throat Allergy/Immunology: itchy/watery eyes or nasal congestion Hematologic/Lymphatic: bleeding problems, blood clots or swollen lymph nodes Endocrine: temperature intolerance or unexpected weight changes Breast: new or changing breast lumps or nipple discharge Resp: cough, shortness of breath, or wheezing CV: chest pain or dyspnea on exertion GI: as per HPI GU: dysuria, trouble voiding, or hematuria MSK: joint pain or joint stiffness Neuro: TIA or stroke symptoms Derm: pruritus and skin lesion changes Psych: anxiety and depression  PE Blood pressure (!) 173/95, pulse 74, temperature 98.6 F (37 C), temperature source Oral, resp. rate 18, height 6' (1.829 m), weight 120.2 kg, SpO2 99 %. Constitutional: NAD; conversant; no deformities Eyes: Moist conjunctiva; no lid lag; anicteric; PERRL Neck: Trachea midline; no thyromegaly Lungs: Normal respiratory effort; no tactile fremitus CV: RRR; no palpable thrills; no pitting edema GI: Abd soft,  distended, mass palpated in the RLQ; no palpable hepatosplenomegaly MSK: Normal range of motion of extremities; no clubbing/cyanosis Psychiatric: Appropriate affect; alert and oriented x3 Lymphatic: No palpable cervical or axillary lymphadenopathy  Results for orders placed or performed during the hospital encounter of 07/16/20 (from the past 48 hour(s))  Lipase, blood     Status:  None   Collection Time: 07/15/20 11:12 PM  Result Value Ref Range   Lipase 39 11 - 51 U/L    Comment: Performed at [facility], [place] [address], [place], [place] [address]  Comprehensive metabolic panel     Status: Abnormal   Collection Time: 07/15/20 11:12 PM  Result Value Ref Range   Sodium 134 (L) 135 - 145 mmol/L   Potassium 4.3 3.5 - 5.1 mmol/L   Chloride 99 98 - 111 mmol/L   CO2 25 22 - 32 mmol/L   Glucose, Bld 163 (H) 70 - 99 mg/dL    Comment: Glucose reference range applies only to samples taken after fasting for at least 8 hours.   BUN 23 8 - 23 mg/dL   Creatinine, Ser 1.04 0.61 - 1.24 mg/dL   Calcium 9.4 8.9 - 10.3 mg/dL   Total Protein 7.5 6.5 - 8.1 g/dL   Albumin 4.5 3.5 - 5.0 g/dL   AST 24 15 - 41 U/L   ALT 29 0 - 44 U/L   Alkaline Phosphatase 73 38 - 126 U/L   Total Bilirubin 0.5 0.3 - 1.2 mg/dL   GFR calc non Af Amer >60 >60 mL/min   GFR calc Af Amer >60 >60 mL/min   Anion gap 10 5 - 15    Comment: Performed at [facility], [place] [address], [place], [place] [address]  CBC     Status: Abnormal   Collection Time: 07/15/20 11:12 PM  Result Value Ref Range   WBC 10.8 (H) 4.0 - 10.5 K/uL   RBC 5.03 4.22 - 5.81 MIL/uL   Hemoglobin 15.7 13.0 - 17.0 g/dL   HCT 46.6 39 - 52 %   MCV 92.6 80.0 - 100.0 fL   MCH 31.2 26.0 - 34.0 pg   MCHC 33.7 30.0 - 36.0 g/dL   RDW 13.7 11.5 - 15.5 %   Platelets 194 150 - 400 K/uL   nRBC 0.0 0.0 - 0.2 %    Comment: Performed at [facility], [place] [address] [place], [place] [address]  Urinalysis,  Routine w reflex microscopic Urine, Clean Catch     Status: Abnormal   Collection Time: 07/15/20 11:12 PM  Result Value Ref Range   Color, Urine YELLOW YELLOW   APPearance CLEAR CLEAR   Specific Gravity, Urine 1.017 1.005 - 1.030   pH 6.0 5.0 - 8.0   Glucose, UA NEGATIVE NEGATIVE mg/dL   Hgb urine dipstick NEGATIVE NEGATIVE   Bilirubin Urine NEGATIVE NEGATIVE   Ketones, ur NEGATIVE NEGATIVE mg/dL   Protein, ur 30 (A) NEGATIVE mg/dL   Nitrite NEGATIVE NEGATIVE   Leukocytes,Ua NEGATIVE NEGATIVE   RBC / HPF 0-5 0 - 5 RBC/hpf   WBC, UA 0-5 0 - 5 WBC/hpf   Bacteria, UA NONE SEEN NONE SEEN   Mucus PRESENT     Comment: Performed at [facility], [place] [address] [place], [place] [address]  SARS Coronavirus 2 by RT PCR (hospital order, performed in [facility] hospital lab) Nasopharyngeal Nasopharyngeal Swab     Status: None   Collection Time: 07/16/20  2:54 AM   Specimen: Nasopharyngeal Swab  Result Value Ref Range   SARS Coronavirus 2 NEGATIVE NEGATIVE    Comment: (NOTE) SARS-CoV-2 target nucleic acids are NOT DETECTED.  The SARS-CoV-2 RNA is generally detectable in upper and lower respiratory specimens during the acute phase of infection. The lowest concentration of SARS-CoV-2 viral copies this assay can detect  is 250 copies / mL. A negative result does not preclude SARS-CoV-2 infection and should not be used as the sole basis for treatment or other patient management decisions.  A negative result may occur with improper specimen collection / handling, submission of specimen other than nasopharyngeal swab, presence of viral mutation(s) within the areas targeted by this assay, and inadequate number of viral copies (<250 copies / mL). A negative result must be combined with clinical observations, patient history, and epidemiological information.  Fact Sheet for Patients:   StrictlyIdeas.no  Fact Sheet for Healthcare  Providers: BankingDealers.co.za  This test is not yet approved or  cleared by the [place] FDA and has been authorized for detection and/or diagnosis of SARS-CoV-2 by FDA under an Emergency Use Authorization (EUA).  This EUA will remain in effect (meaning this test can be used) for the duration of the COVID-19 declaration under Section 564(b)(1) of the Act, 21 U.S.C. section 360bbb-3(b)(1), unless the authorization is terminated or revoked sooner.  Performed at [facility], [place] [address], [place], [place] [address]     CT ABDOMEN PELVIS W CONTRAST  Result Date: 07/16/2020 CLINICAL DATA:  Right lower quadrant abdominal pain EXAM: CT ABDOMEN AND PELVIS WITH CONTRAST TECHNIQUE: Multidetector CT imaging of the abdomen and pelvis was performed using the standard protocol following bolus administration of intravenous contrast. CONTRAST:  126mL OMNIPAQUE IOHEXOL 300 MG/ML  SOLN COMPARISON:  None. FINDINGS: Lower chest: The visualized lung bases are clear bilaterally. Extensive multi-vessel coronary artery calcification. Moderate calcification of the mitral valve annulus. Global cardiac size within normal limits. No pericardial effusion. Hepatobiliary: The gallbladder is mildly distended and mild pericholecystic inflammatory changes developed since prior examination. Together, the findings are suggestive of acute cholecystitis. The liver is unremarkable. No intra or extrahepatic biliary ductal dilation. Pancreas: Unremarkable Spleen: Unremarkable Adrenals/Urinary Tract: Adrenal glands are unremarkable. Kidneys are normal, without renal calculi, focal lesion, or hydronephrosis. Bladder is unremarkable. Stomach/Bowel: The stomach is mildly distended. The proximal and mid small bowel is mildly dilated and fluid-filled to the point of bowel herniation into a right lower quadrant abdominal wall hernia. This is best seen on axial image # 73 and sagittal image # 56.  Bowel within the hernia sac is mildly fluid distended. Distally, the small bowel is decompressed. The large bowel is unremarkable. Appendix normal. No free intraperitoneal gas or fluid. Vascular/Lymphatic: Moderate aortoiliac atherosclerotic calcification without evidence of aneurysm. No pathologic adenopathy within the abdomen and pelvis. Reproductive: Prostate is unremarkable. Other: Rectum unremarkable. Musculoskeletal: Advanced degenerative changes are seen within the lumbar spine. No lytic or blastic bone lesions are seen. IMPRESSION: Small-bowel obstruction secondary to herniated bowel within a right lower quadrant abdominal wall hernia. Pericholecystic inflammatory change suggestive of acute cholecystitis. Correlation with liver enzymes and possible hepatic biliary scintigraphy may be helpful to confirm this. Aortic Atherosclerosis (ICD10-I70.0). Electronically Signed   By: Fidela Salisbury MD   On: 07/16/2020 02:49     A/P: Troy Garrison is an 79 y.o. male with an incarcerated RLQ hernia.    Insert NG NPO Plan for OR later this AM with Dr Lucia Gaskins Admit to floor   Rosario Adie, MD  [facility]

## 2020-07-16 NOTE — ED Notes (Signed)
Updated wife with care plan-she would like to be called once patient is out of surgery [phone]

## 2020-07-16 NOTE — Progress Notes (Signed)
Reviewed history and examined patient.  Has a RLQ abdominal wall hernia in an odd location.  No prior surgery in the area.  I discussed laparoscopic/open hernia repair.   I discussed the risks of bowel resection, infection, bleeding and recurrence of the hernia.  On the CT scan, there is also mention of pericholecystic inflammatory changes.  The patient has no RUQ symptoms and no known gall bladder disease.  I will try to look at the gall bladder during the laparoscopy.  If the gall bladder looks acutely inflamed, I will either remove it or drain it.  The primary risks of gall bladder surgery include, but are not limited to, bleeding, infection, common bile duct injury, and open surgery.     His wife Stanton Kidney is at home.  He has 5 children.\  Alphonsa Overall, MD, [facility] Office phone:  [phone]

## 2020-07-16 NOTE — Anesthesia Procedure Notes (Signed)
Date/Time: 07/16/2020 1:51 PM Performed by: Cynda Familia, CRNA Oxygen Delivery Method: Simple face mask Placement Confirmation: positive ETCO2 and breath sounds checked- equal and bilateral Dental Injury: Teeth and Oropharynx as per pre-operative assessment

## 2020-07-16 NOTE — Plan of Care (Signed)
  Problem: Education: Goal: Knowledge of General Education information will improve Description: Including pain rating scale, medication(s)/side effects and non-pharmacologic comfort measures Outcome: Progressing   Problem: Clinical Measurements: Goal: Will remain free from infection Outcome: Progressing   

## 2020-07-16 NOTE — Plan of Care (Signed)
  Problem: Education: Goal: Knowledge of General Education information will improve Description: Including pain rating scale, medication(s)/side effects and non-pharmacologic comfort measures 07/16/2020 1335 by Olen Cordial, RN Outcome: Adequate for Discharge 07/16/2020 1334 by Olen Cordial, RN Outcome: Progressing

## 2020-07-16 NOTE — Plan of Care (Signed)
Careplan completion/adequate for discharge documentation was done in error. Careplan to be initiated upon arrival to floor. Deanna Artis RN

## 2020-07-16 NOTE — Anesthesia Preprocedure Evaluation (Addendum)
Anesthesia Evaluation  Patient identified by MRN, date of birth, ID band Patient awake    Reviewed: Allergy & Precautions, NPO status , Patient's Chart, lab work & pertinent test results  History of Anesthesia Complications Negative for: history of anesthetic complications  Airway Mallampati: II  TM Distance: >3 FB Neck ROM: Full    Dental no notable dental hx. (+) Dental Advisory Given   Pulmonary neg pulmonary ROS, former smoker,    Pulmonary exam normal        Cardiovascular Normal cardiovascular exam+ dysrhythmias Atrial Fibrillation   S/P ablation for atrial flutter   Neuro/Psych negative neurological ROS     GI/Hepatic Neg liver ROS, PUD, GERD  ,  Endo/Other  negative endocrine ROS  Renal/GU negative Renal ROS     Musculoskeletal negative musculoskeletal ROS (+)   Abdominal   Peds  Hematology negative hematology ROS (+)   Anesthesia Other Findings   Reproductive/Obstetrics                            Anesthesia Physical Anesthesia Plan  ASA: III and emergent  Anesthesia Plan: General   Post-op Pain Management:    Induction: Intravenous, Rapid sequence and Cricoid pressure planned  PONV Risk Score and Plan: 4 or greater and Ondansetron, Dexamethasone, Diphenhydramine and Treatment may vary due to age or medical condition  Airway Management Planned: Oral ETT  Additional Equipment:   Intra-op Plan:   Post-operative Plan: Extubation in OR  Informed Consent: I have reviewed the patients History and Physical, chart, labs and discussed the procedure including the risks, benefits and alternatives for the proposed anesthesia with the patient or authorized representative who has indicated his/her understanding and acceptance.     Dental advisory given  Plan Discussed with: CRNA and Anesthesiologist  Anesthesia Plan Comments:         Anesthesia Quick Evaluation

## 2020-07-17 ENCOUNTER — Encounter (HOSPITAL_COMMUNITY): Payer: Self-pay | Admitting: Surgery

## 2020-07-17 DIAGNOSIS — Z823 Family history of stroke: Secondary | ICD-10-CM | POA: Diagnosis not present

## 2020-07-17 DIAGNOSIS — G610 Guillain-Barre syndrome: Secondary | ICD-10-CM | POA: Diagnosis not present

## 2020-07-17 DIAGNOSIS — K430 Incisional hernia with obstruction, without gangrene: Secondary | ICD-10-CM | POA: Diagnosis not present

## 2020-07-17 DIAGNOSIS — Z832 Family history of diseases of the blood and blood-forming organs and certain disorders involving the immune mechanism: Secondary | ICD-10-CM | POA: Diagnosis not present

## 2020-07-17 DIAGNOSIS — Z85820 Personal history of malignant melanoma of skin: Secondary | ICD-10-CM | POA: Diagnosis not present

## 2020-07-17 DIAGNOSIS — E785 Hyperlipidemia, unspecified: Secondary | ICD-10-CM | POA: Diagnosis present

## 2020-07-17 DIAGNOSIS — Z8249 Family history of ischemic heart disease and other diseases of the circulatory system: Secondary | ICD-10-CM | POA: Diagnosis not present

## 2020-07-17 DIAGNOSIS — E119 Type 2 diabetes mellitus without complications: Secondary | ICD-10-CM | POA: Diagnosis present

## 2020-07-17 DIAGNOSIS — K219 Gastro-esophageal reflux disease without esophagitis: Secondary | ICD-10-CM | POA: Diagnosis not present

## 2020-07-17 DIAGNOSIS — Z8042 Family history of malignant neoplasm of prostate: Secondary | ICD-10-CM | POA: Diagnosis not present

## 2020-07-17 DIAGNOSIS — Z8711 Personal history of peptic ulcer disease: Secondary | ICD-10-CM | POA: Diagnosis not present

## 2020-07-17 DIAGNOSIS — Z833 Family history of diabetes mellitus: Secondary | ICD-10-CM | POA: Diagnosis not present

## 2020-07-17 DIAGNOSIS — Z87891 Personal history of nicotine dependence: Secondary | ICD-10-CM | POA: Diagnosis not present

## 2020-07-17 DIAGNOSIS — I10 Essential (primary) hypertension: Secondary | ICD-10-CM | POA: Diagnosis present

## 2020-07-17 DIAGNOSIS — Z79899 Other long term (current) drug therapy: Secondary | ICD-10-CM | POA: Diagnosis not present

## 2020-07-17 DIAGNOSIS — Z20822 Contact with and (suspected) exposure to covid-19: Secondary | ICD-10-CM | POA: Diagnosis not present

## 2020-07-17 DIAGNOSIS — Z7982 Long term (current) use of aspirin: Secondary | ICD-10-CM | POA: Diagnosis not present

## 2020-07-17 DIAGNOSIS — Z4659 Encounter for fitting and adjustment of other gastrointestinal appliance and device: Secondary | ICD-10-CM | POA: Diagnosis not present

## 2020-07-17 DIAGNOSIS — Z96653 Presence of artificial knee joint, bilateral: Secondary | ICD-10-CM | POA: Diagnosis not present

## 2020-07-17 DIAGNOSIS — H905 Unspecified sensorineural hearing loss: Secondary | ICD-10-CM | POA: Diagnosis not present

## 2020-07-17 LAB — BASIC METABOLIC PANEL
Anion gap: 10 (ref 5–15)
BUN: 18 mg/dL (ref 8–23)
CO2: 24 mmol/L (ref 22–32)
Calcium: 8.7 mg/dL — ABNORMAL LOW (ref 8.9–10.3)
Chloride: 100 mmol/L (ref 98–111)
Creatinine, Ser: 0.91 mg/dL (ref 0.61–1.24)
GFR calc Af Amer: >60 mL/min (ref >60)
GFR calc non Af Amer: >60 mL/min (ref >60)
Glucose, Bld: 143 mg/dL — ABNORMAL HIGH (ref 70–99)
Potassium: 4.3 mmol/L (ref 3.5–5.1)
Sodium: 134 mmol/L — ABNORMAL LOW (ref 135–145)

## 2020-07-17 LAB — GLUCOSE, CAPILLARY
Glucose-Capillary: 121 mg/dL — ABNORMAL HIGH (ref 70–99)
Glucose-Capillary: 148 mg/dL — ABNORMAL HIGH (ref 70–99)
Glucose-Capillary: 96 mg/dL (ref 70–99)
Glucose-Capillary: 110 mg/dL — ABNORMAL HIGH (ref 70–99)

## 2020-07-17 LAB — CBC
HCT: 48.1 % (ref 39.0–52.0)
Hemoglobin: 15.7 g/dL (ref 13.0–17.0)
MCH: 31.7 pg (ref 26.0–34.0)
MCHC: 32.6 g/dL (ref 30.0–36.0)
MCV: 97 fL (ref 80.0–100.0)
Platelets: 200 10*3/uL (ref 150–400)
RBC: 4.96 MIL/uL (ref 4.22–5.81)
RDW: 13.8 % (ref 11.5–15.5)
WBC: 15.5 10*3/uL — ABNORMAL HIGH (ref 4.0–10.5)
nRBC: 0 % (ref 0.0–0.2)

## 2020-07-17 MED ORDER — METHOCARBAMOL 500 MG PO TABS
500.0000 mg | ORAL_TABLET | Freq: Four times a day (QID) | ORAL | Status: DC | PRN
  Administered 2020-07-17: 500 mg via ORAL
  Filled 2020-07-17: qty 1

## 2020-07-17 MED ORDER — ACETAMINOPHEN 500 MG PO TABS
1000.0000 mg | ORAL_TABLET | Freq: Four times a day (QID) | ORAL | Status: DC
  Administered 2020-07-17 – 2020-07-18 (×4): 1000 mg via ORAL
  Filled 2020-07-17 (×4): qty 2

## 2020-07-17 MED ORDER — TRAMADOL HCL 50 MG PO TABS: 50.0000 mg | ORAL_TABLET | Freq: Four times a day (QID) | ORAL | Status: DC | PRN

## 2020-07-17 MED ORDER — METFORMIN HCL ER 750 MG PO TB24
750.0000 mg | ORAL_TABLET | Freq: Every day | ORAL | Status: DC
  Administered 2020-07-18: 750 mg via ORAL
  Filled 2020-07-17: qty 1

## 2020-07-17 MED ORDER — PRAVASTATIN SODIUM 20 MG PO TABS
20.0000 mg | ORAL_TABLET | Freq: Every day | ORAL | Status: DC
  Administered 2020-07-17 – 2020-07-18 (×2): 20 mg via ORAL
  Filled 2020-07-17 (×2): qty 1

## 2020-07-17 NOTE — Plan of Care (Signed)
Plan of care reviewed and discussed with the patient. 

## 2020-07-17 NOTE — Progress Notes (Addendum)
1 Day Post-Op  Subjective: Doing well.  Have no pain at all.  Up in chair.  No nausea, no NGT output at all.  Passing lots of flatus and has already had a BM and needs to have another.  ROS: See above, otherwise other systems negative  Objective: Vital signs in last 24 hours: Temp:  [97.4 F (36.3 C)-98.6 F (37 C)] 98.5 F (36.9 C) (09/24 0559) Pulse Rate:  [80-100] 81 (09/24 0559) Resp:  [15-18] 17 (09/24 0559) BP: (168-195)/(80-98) 184/95 (09/24 0559) SpO2:  [93 %-100 %] 98 % (09/24 0559) Last BM Date: 07/15/20  Intake/Output from previous day: 09/23 0701 - 09/24 0700 In: 2902 [P.O.:90; I.V.:2258.7; IV Piggyback:553.3] Out: 482 [Urine:451; Stool:1; Blood:30] Intake/Output this shift: No intake/output data recorded.  PE: Heart: regular Lungs: CTAB Abd: soft, obese, +BS, NT, incisions c/d/i with dermabond  Lab Results:  Recent Labs    07/15/20 2312 07/17/20 0340  WBC 10.8* 15.5*  HGB 15.7 15.7  HCT 46.6 48.1  PLT 194 200   BMET Recent Labs    07/15/20 2312 07/17/20 0340  NA 134* 134*  K 4.3 4.3  CL 99 100  CO2 25 24  GLUCOSE 163* 143*  BUN 23 18  CREATININE 1.04 0.91  CALCIUM 9.4 8.7*   PT/INR No results for input(s): LABPROT, INR in the last 72 hours. CMP     Component Value Date/Time   NA 134 (L) 07/17/2020 0340   K 4.3 07/17/2020 0340   CL 100 07/17/2020 0340   CO2 24 07/17/2020 0340   GLUCOSE 143 (H) 07/17/2020 0340   BUN 18 07/17/2020 0340   CREATININE 0.91 07/17/2020 0340   CALCIUM 8.7 (L) 07/17/2020 0340   PROT 7.5 07/15/2020 2312   ALBUMIN 4.5 07/15/2020 2312   AST 24 07/15/2020 2312   ALT 29 07/15/2020 2312   ALKPHOS 73 07/15/2020 2312   BILITOT 0.5 07/15/2020 2312   GFRNONAA >60 07/17/2020 0340   GFRAA >60 07/17/2020 0340   Lipase     Component Value Date/Time   LIPASE 39 07/15/2020 2312       Studies/Results: CT ABDOMEN PELVIS W CONTRAST  Result Date: 07/16/2020 CLINICAL DATA:  Right lower quadrant abdominal  pain EXAM: CT ABDOMEN AND PELVIS WITH CONTRAST TECHNIQUE: Multidetector CT imaging of the abdomen and pelvis was performed using the standard protocol following bolus administration of intravenous contrast. CONTRAST:  168mL OMNIPAQUE IOHEXOL 300 MG/ML  SOLN COMPARISON:  None. FINDINGS: Lower chest: The visualized lung bases are clear bilaterally. Extensive multi-vessel coronary artery calcification. Moderate calcification of the mitral valve annulus. Global cardiac size within normal limits. No pericardial effusion. Hepatobiliary: The gallbladder is mildly distended and mild pericholecystic inflammatory changes developed since prior examination. Together, the findings are suggestive of acute cholecystitis. The liver is unremarkable. No intra or extrahepatic biliary ductal dilation. Pancreas: Unremarkable Spleen: Unremarkable Adrenals/Urinary Tract: Adrenal glands are unremarkable. Kidneys are normal, without renal calculi, focal lesion, or hydronephrosis. Bladder is unremarkable. Stomach/Bowel: The stomach is mildly distended. The proximal and mid small bowel is mildly dilated and fluid-filled to the point of bowel herniation into a right lower quadrant abdominal wall hernia. This is best seen on axial image # 73 and sagittal image # 56. Bowel within the hernia sac is mildly fluid distended. Distally, the small bowel is decompressed. The large bowel is unremarkable. Appendix normal. No free intraperitoneal gas or fluid. Vascular/Lymphatic: Moderate aortoiliac atherosclerotic calcification without evidence of aneurysm. No pathologic adenopathy within the abdomen and  pelvis. Reproductive: Prostate is unremarkable. Other: Rectum unremarkable. Musculoskeletal: Advanced degenerative changes are seen within the lumbar spine. No lytic or blastic bone lesions are seen. IMPRESSION: Small-bowel obstruction secondary to herniated bowel within a right lower quadrant abdominal wall hernia. Pericholecystic inflammatory change  suggestive of acute cholecystitis. Correlation with liver enzymes and possible hepatic biliary scintigraphy may be helpful to confirm this. Aortic Atherosclerosis (ICD10-I70.0). Electronically Signed   By: Fidela Salisbury MD   On: 07/16/2020 02:49   DG Abd Portable 1V  Result Date: 07/16/2020 CLINICAL DATA:  NG tube placement. EXAM: PORTABLE ABDOMEN - 1 VIEW COMPARISON:  CT 07/16/2020. FINDINGS: NG tube noted with its tip over the stomach. Persistent small-bowel distention. Colon nondistended. Stool in the colon. No free air. IMPRESSION: NG tube noted with tip over the stomach. Persistent small-bowel distention. Electronically Signed   By: Marcello Moores  Register   On: 07/16/2020 07:38    Anti-infectives: Anti-infectives (From admission, onward)   Start     Dose/Rate Route Frequency Ordered Stop   07/16/20 1000  ceFAZolin (ANCEF) IVPB 2g/100 mL premix        2 g 200 mL/hr over 30 Minutes Intravenous On call to O.R. 07/16/20 0700 07/16/20 1144       Assessment/Plan DM - resume metformin, SSI insulin HTN - lisinopril and ASA  POD 1, s/p lap RLQ abdominal wall hernia repair with mesh, Dr. Lucia Gaskins 9/23  -obstruction has resolved.  -no NGT ouput + flatus and BM, DC NGT  -start CLD -pulling over 1500 on IS -up in chair and going for a walk soon  -SLIV  -not taking anything for pain except the Tylenol we are giving him  -may be ready for DC tomorrow  FEN - CLD/SLIV VTE - SCDs/ASA/Lovenox ID - none   LOS: 0 days    Henreitta Cea , [facility] 07/17/2020, 9:28 AM Please see Amion for pager number during day hours 7:00am-4:30pm or 7:00am -11:30am on weekends  Agree with above. He is progressing rapidly.  Possibly home in AM.  Alphonsa Overall, MD, [facility] Office phone:  [phone]

## 2020-07-18 LAB — GLUCOSE, CAPILLARY
Glucose-Capillary: 138 mg/dL — ABNORMAL HIGH (ref 70–99)
Glucose-Capillary: 135 mg/dL — ABNORMAL HIGH (ref 70–99)

## 2020-07-18 MED ORDER — LISINOPRIL 20 MG PO TABS
20.0000 mg | ORAL_TABLET | Freq: Every day | ORAL | Status: DC
  Administered 2020-07-18: 20 mg via ORAL
  Filled 2020-07-18: qty 1

## 2020-07-18 MED ORDER — METOPROLOL TARTRATE 5 MG/5ML IV SOLN
5.0000 mg | Freq: Once | INTRAVENOUS | Status: AC
  Administered 2020-07-18: 5 mg via INTRAVENOUS
  Filled 2020-07-18: qty 5

## 2020-07-18 MED ORDER — LISINOPRIL 20 MG PO TABS: 20.0000 mg | ORAL_TABLET | Freq: Every day | ORAL | Status: DC

## 2020-07-18 MED ORDER — DOCUSATE SODIUM 100 MG PO CAPS: 100.0000 mg | ORAL_CAPSULE | Freq: Two times a day (BID) | ORAL | 0 refills | Status: DC

## 2020-07-18 MED ORDER — TRAMADOL HCL 50 MG PO TABS
50.0000 mg | ORAL_TABLET | Freq: Four times a day (QID) | ORAL | 0 refills | Status: DC | PRN
Start: 2020-07-18 — End: 2020-07-28

## 2020-07-18 NOTE — Progress Notes (Addendum)
    2 Days Post-Op  Subjective: Doing well.  Have no pain at all.  Up in chair.  No nausea, tolerating clears  Objective: Vital signs in last 24 hours: Temp:  [97.9 F (36.6 C)-98.5 F (36.9 C)] 98 F (36.7 C) (09/25 0512) Pulse Rate:  [62-86] 62 (09/25 0601) Resp:  [16-18] 18 (09/25 0512) BP: (166-201)/(82-89) 192/87 (09/25 0601) SpO2:  [97 %-100 %] 100 % (09/25 0512) Last BM Date: 07/15/20  Intake/Output from previous day: 09/24 0701 - 09/25 0700 In: 275 [P.O.:75; I.V.:200] Out: 0  Intake/Output this shift: No intake/output data recorded.  PE: Abd: soft, obese, ecchymosis present RLQ Lab Results:  Recent Labs    07/15/20 2312 07/17/20 0340  WBC 10.8* 15.5*  HGB 15.7 15.7  HCT 46.6 48.1  PLT 194 200   BMET Recent Labs    07/15/20 2312 07/17/20 0340  NA 134* 134*  K 4.3 4.3  CL 99 100  CO2 25 24  GLUCOSE 163* 143*  BUN 23 18  CREATININE 1.04 0.91  CALCIUM 9.4 8.7*   PT/INR No results for input(s): LABPROT, INR in the last 72 hours. CMP     Component Value Date/Time   NA 134 (L) 07/17/2020 0340   K 4.3 07/17/2020 0340   CL 100 07/17/2020 0340   CO2 24 07/17/2020 0340   GLUCOSE 143 (H) 07/17/2020 0340   BUN 18 07/17/2020 0340   CREATININE 0.91 07/17/2020 0340   CALCIUM 8.7 (L) 07/17/2020 0340   PROT 7.5 07/15/2020 2312   ALBUMIN 4.5 07/15/2020 2312   AST 24 07/15/2020 2312   ALT 29 07/15/2020 2312   ALKPHOS 73 07/15/2020 2312   BILITOT 0.5 07/15/2020 2312   GFRNONAA >60 07/17/2020 0340   GFRAA >60 07/17/2020 0340   Lipase     Component Value Date/Time   LIPASE 39 07/15/2020 2312       Studies/Results: No results found.  Anti-infectives: Anti-infectives (From admission, onward)   Start     Dose/Rate Route Frequency Ordered Stop   07/16/20 1000  ceFAZolin (ANCEF) IVPB 2g/100 mL premix        2 g 200 mL/hr over 30 Minutes Intravenous On call to O.R. 07/16/20 0700 07/16/20 1144       Assessment/Plan DM - resume metformin, SSI  insulin HTN - lisinopril and ASA  POD 2, s/p lap RLQ abdominal wall hernia repair with mesh, Dr. Lucia Gaskins 9/23  -obstruction has resolved.  -advance diet HTN: pt is not on home dose of lisinopril.  This was corrected today FEN - advance to reg diet VTE - SCDs/ASA/Lovenox ID - none   LOS: 1 day    Rosario Adie, MD  [facility]

## 2020-07-18 NOTE — Plan of Care (Signed)
Patient discharged all care planes met and discontinued. Deanna Artis, RN

## 2020-07-18 NOTE — Plan of Care (Signed)
  Problem: Education: Goal: Knowledge of General Education information will improve Description: Including pain rating scale, medication(s)/side effects and non-pharmacologic comfort measures Outcome: Progressing   Problem: Pain Managment: Goal: General experience of comfort will improve Outcome: Progressing   

## 2020-07-18 NOTE — Discharge Summary (Signed)
Patient ID: Troy Garrison [id number] 79 y.o. 01/05/1941  07/16/2020  Discharge date and time: No discharge date for patient encounter.  Admitting Physician: Rosario Adie  Discharge Physician: Rosario Adie  Admission Diagnoses: Incisional hernia with obstruction [K43.0] Encounter for nasogastric (NG) tube placement [Z46.59] Obstruction concurrent with and due to hernia of abdominal wall [K43.6]  Discharge Diagnoses: incarcerated hernia  Operations:DIAGNOSTIC LAPAROSCOPY, LAPAROSCOPIC ABDOMINAL WALL HERNIA REPAIR WITH MESH    Discharged Condition: good    Hospital Course: Pt admitted to the floor and underwent urgent lap repair of incarcerated abd wall hernia.  Diet was advanced as tolerated.  Pt was ready for discharge on POD 2.  Consults: None  Significant Diagnostic Studies: labs: cbc, bmet and radiology: CT scan: incarcerated herina  Treatments: IV hydration, analgesia: acetaminophen and surgery: see above  Disposition: Home

## 2020-07-18 NOTE — Plan of Care (Signed)
  Problem: Education: Goal: Knowledge of General Education information will improve Description: Including pain rating scale, medication(s)/side effects and non-pharmacologic comfort measures Outcome: Progressing   Problem: Clinical Measurements: Goal: Ability to maintain clinical measurements within normal limits will improve Outcome: Progressing   

## 2020-07-18 NOTE — Discharge Instructions (Signed)
[facility]  Please arrive at least 30 min before your appointment to complete your check in paperwork.  If you are unable to arrive 30 min prior to your appointment time we may have to cancel or reschedule you. LAPAROSCOPIC SURGERY: POST OP INSTRUCTIONS Always review your discharge instruction sheet given to you by the facility where your surgery was performed. IF YOU HAVE DISABILITY OR FAMILY LEAVE FORMS, YOU MUST BRING THEM TO THE OFFICE FOR PROCESSING.   DO NOT GIVE THEM TO YOUR DOCTOR.  PAIN CONTROL  1. First take acetaminophen (Tylenol) AND/or ibuprofen (Advil) to control your pain after surgery.  Follow directions on package.  Taking acetaminophen (Tylenol) and/or ibuprofen (Advil) regularly after surgery will help to control your pain and lower the amount of prescription pain medication you may need.  You should not take more than 4,000 mg (4 grams) of acetaminophen (Tylenol) in 24 hours.  You should not take ibuprofen (Advil), aleve, motrin, naprosyn or other NSAIDS if you have a history of stomach ulcers or chronic kidney disease.  2. A prescription for pain medication may be given to you upon discharge.  Take your pain medication as prescribed, if you still have uncontrolled pain after taking acetaminophen (Tylenol) or ibuprofen (Advil). 3. Use ice packs to help control pain. 4. If you need a refill on your pain medication, please contact your pharmacy.  They will contact our office to request authorization. Prescriptions will not be filled after 5pm or on week-ends.  HOME MEDICATIONS 5. Take your usually prescribed medications unless otherwise directed.  DIET 6. You should follow a light diet the first few days after arrival home.  Be sure to include lots of fluids daily. Avoid fatty, fried foods.   CONSTIPATION 7. It is common to experience some constipation after surgery and if you are taking pain medication.  Increasing fluid intake and taking a stool  softener (such as Colace) will usually help or prevent this problem from occurring.  A mild laxative (Milk of Magnesia or Miralax) should be taken according to package instructions if there are no bowel movements after 48 hours.  WOUND/INCISION CARE 8. Most patients will experience some swelling and bruising in the area of the incisions.  Ice packs will help.  Swelling and bruising can take several days to resolve.  9. Unless discharge instructions indicate otherwise, follow guidelines below  a. STERI-STRIPS - you may remove your outer bandages 48 hours after surgery, and you may shower at that time.  You have steri-strips (small skin tapes) in place directly over the incision.  These strips should be left on the skin for 7-10 days.   b. DERMABOND/SKIN GLUE - you may shower in 24 hours.  The glue will flake off over the next 2-3 weeks. 10. Any sutures or staples will be removed at the office during your follow-up visit.  ACTIVITIES 11. You may resume regular (light) daily activities beginning the next day--such as daily self-care, walking, climbing stairs--gradually increasing activities as tolerated.  You may have sexual intercourse when it is comfortable.  Refrain from any heavy lifting or straining until approved by your doctor. a. You may drive when you are no longer taking prescription pain medication, you can comfortably wear a seatbelt, and you can safely maneuver your car and apply brakes.  FOLLOW-UP 12. You should see your doctor in the office for a follow-up appointment approximately 2-3 weeks after your surgery.  You should have been given your post-op/follow-up appointment when   your surgery was scheduled.  If you did not receive a post-op/follow-up appointment, make sure that you call for this appointment within a day or two after you arrive home to insure a convenient appointment time.   WHEN TO CALL YOUR DOCTOR: 1. Fever over 101.0 2. Inability to urinate 3. Continued bleeding from  incision. 4. Increased pain, redness, or drainage from the incision. 5. Increasing abdominal pain  The clinic staff is available to answer your questions during regular business hours.  Please don't hesitate to call and ask to speak to one of the nurses for clinical concerns.  If you have a medical emergency, go to the nearest emergency room or call 911.  A surgeon from [facility] is always on call at the hospital. [address], [address], [place], [place]  [address] ? [address] [address], , [place]   [address] [phone] ? [phone] ? FAX [phone]  .........   Managing Your Pain After Surgery Without Opioids    Thank you for participating in our program to help patients manage their pain after surgery without opioids. This is part of our effort to provide you with the best care possible, without exposing you or your family to the risk that opioids pose.  What pain can I expect after surgery? You can expect to have some pain after surgery. This is normal. The pain is typically worse the day after surgery, and quickly begins to get better. Many studies have found that many patients are able to manage their pain after surgery with Over-the-Counter (OTC) medications such as Tylenol and Motrin. If you have a condition that does not allow you to take Tylenol or Motrin, notify your surgical team.  How will I manage my pain? The best strategy for controlling your pain after surgery is around the clock pain control with Tylenol (acetaminophen) and Motrin (ibuprofen or Advil). Alternating these medications with each other allows you to maximize your pain control. In addition to Tylenol and Motrin, you can use heating pads or ice packs on your incisions to help reduce your pain.  How will I alternate your regular strength over-the-counter pain medication? You will take a dose of pain medication every three hours. ; Start by taking 650 mg of Tylenol (2 pills of 325  mg) ; 3 hours later take 600 mg of Motrin (3 pills of 200 mg) ; 3 hours after taking the Motrin take 650 mg of Tylenol ; 3 hours after that take 600 mg of Motrin.   - 1 -  See example - if your first dose of Tylenol is at 12:00 PM   12:00 PM Tylenol 650 mg (2 pills of 325 mg)  3:00 PM Motrin 600 mg (3 pills of 200 mg)  6:00 PM Tylenol 650 mg (2 pills of 325 mg)  9:00 PM Motrin 600 mg (3 pills of 200 mg)  Continue alternating every 3 hours   We recommend that you follow this schedule around-the-clock for at least 3 days after surgery, or until you feel that it is no longer needed. Use the table on the last page of this handout to keep track of the medications you are taking. Important: Do not take more than 3000mg of Tylenol or 3200mg of Motrin in a 24-hour period. Do not take ibuprofen/Motrin if you have a history of bleeding stomach ulcers, severe kidney disease, &/or actively taking a blood thinner  What if I still have pain? If you have pain that is not   controlled with the over-the-counter pain medications (Tylenol and Motrin or Advil) you might have what we call "breakthrough" pain. You will receive a prescription for a small amount of an opioid pain medication such as Oxycodone, Tramadol, or Tylenol with Codeine. Use these opioid pills in the first 24 hours after surgery if you have breakthrough pain. Do not take more than 1 pill every 4-6 hours.  If you still have uncontrolled pain after using all opioid pills, don't hesitate to call our staff using the number provided. We will help make sure you are managing your pain in the best way possible, and if necessary, we can provide a prescription for additional pain medication.   Day 1    Time  Name of Medication Number of pills taken  Amount of Acetaminophen  Pain Level   Comments  AM PM       AM PM       AM PM       AM PM       AM PM       AM PM       AM PM       AM PM       Total Daily amount of Acetaminophen Do not  take more than  3,000 mg per day      Day 2    Time  Name of Medication Number of pills taken  Amount of Acetaminophen  Pain Level   Comments  AM PM       AM PM       AM PM       AM PM       AM PM       AM PM       AM PM       AM PM       Total Daily amount of Acetaminophen Do not take more than  3,000 mg per day      Day 3    Time  Name of Medication Number of pills taken  Amount of Acetaminophen  Pain Level   Comments  AM PM       AM PM       AM PM       AM PM          AM PM       AM PM       AM PM       AM PM       Total Daily amount of Acetaminophen Do not take more than  3,000 mg per day      Day 4    Time  Name of Medication Number of pills taken  Amount of Acetaminophen  Pain Level   Comments  AM PM       AM PM       AM PM       AM PM       AM PM       AM PM       AM PM       AM PM       Total Daily amount of Acetaminophen Do not take more than  3,000 mg per day      Day 5    Time  Name of Medication Number of pills taken  Amount of Acetaminophen  Pain Level   Comments  AM PM       AM PM       AM   PM       AM PM       AM PM       AM PM       AM PM       AM PM       Total Daily amount of Acetaminophen Do not take more than  3,000 mg per day       Day 6    Time  Name of Medication Number of pills taken  Amount of Acetaminophen  Pain Level  Comments  AM PM       AM PM       AM PM       AM PM       AM PM       AM PM       AM PM       AM PM       Total Daily amount of Acetaminophen Do not take more than  3,000 mg per day      Day 7    Time  Name of Medication Number of pills taken  Amount of Acetaminophen  Pain Level   Comments  AM PM       AM PM       AM PM       AM PM       AM PM       AM PM       AM PM       AM PM       Total Daily amount of Acetaminophen Do not take more than  3,000 mg per day        For additional information about how and where to safely dispose of unused  opioid medications - https://www.morepowerfulnc.org  Disclaimer: This document contains information and/or instructional materials adapted from [facility] for the typical patient with your condition. It does not replace medical advice from your health care provider because your experience may differ from that of the typical patient. Talk to your health care provider if you have any questions about this document, your condition or your treatment plan. Adapted from [facility]   

## 2020-07-19 ENCOUNTER — Other Ambulatory Visit: Payer: Self-pay

## 2020-07-19 ENCOUNTER — Emergency Department (HOSPITAL_COMMUNITY)
Admission: EM | Admit: 2020-07-19 | Discharge: 2020-07-19 | Disposition: A | Discharge status: Home / Self Care | Payer: PPO | Attending: Emergency Medicine | Admitting: Emergency Medicine

## 2020-07-19 ENCOUNTER — Encounter (HOSPITAL_COMMUNITY): Payer: Self-pay | Admitting: Emergency Medicine

## 2020-07-19 DIAGNOSIS — I10 Essential (primary) hypertension: Secondary | ICD-10-CM | POA: Insufficient documentation

## 2020-07-19 DIAGNOSIS — R109 Unspecified abdominal pain: Secondary | ICD-10-CM | POA: Diagnosis present

## 2020-07-19 DIAGNOSIS — Z7984 Long term (current) use of oral hypoglycemic drugs: Secondary | ICD-10-CM | POA: Diagnosis not present

## 2020-07-19 DIAGNOSIS — Z7982 Long term (current) use of aspirin: Secondary | ICD-10-CM | POA: Insufficient documentation

## 2020-07-19 DIAGNOSIS — Z87891 Personal history of nicotine dependence: Secondary | ICD-10-CM | POA: Insufficient documentation

## 2020-07-19 DIAGNOSIS — R112 Nausea with vomiting, unspecified: Secondary | ICD-10-CM | POA: Insufficient documentation

## 2020-07-19 DIAGNOSIS — K219 Gastro-esophageal reflux disease without esophagitis: Secondary | ICD-10-CM | POA: Insufficient documentation

## 2020-07-19 DIAGNOSIS — Z96651 Presence of right artificial knee joint: Secondary | ICD-10-CM | POA: Diagnosis not present

## 2020-07-19 DIAGNOSIS — R7303 Prediabetes: Secondary | ICD-10-CM | POA: Insufficient documentation

## 2020-07-19 DIAGNOSIS — R140 Abdominal distension (gaseous): Secondary | ICD-10-CM

## 2020-07-19 DIAGNOSIS — Z85828 Personal history of other malignant neoplasm of skin: Secondary | ICD-10-CM | POA: Insufficient documentation

## 2020-07-19 NOTE — ED Notes (Signed)
Tolerated PO intake.  

## 2020-07-19 NOTE — ED Provider Notes (Signed)
[facility] DEPT Provider Note   CSN: [id number] Arrival date & time: 07/19/20  0700     History Chief Complaint  Patient presents with   Abdominal Pain    Troy Garrison is a 79 y.o. male.  The history is provided by the patient and medical records. No language interpreter was used.  Emesis Severity:  Severe Duration:  2 days Timing:  Intermittent Quality:  Stomach contents Progression:  Resolved Chronicity:  New Recent urination:  Normal Relieved by:  Nothing Worsened by:  Nothing Ineffective treatments:  None tried Associated symptoms: no abdominal pain, no chills, no cough, no diarrhea, no fever, no headaches, no sore throat and no URI   Risk factors: prior abdominal surgery        Past Medical History:  Diagnosis Date   Atrial flutter ([place])    S/P RFCA with Dr. Caryl Comes 6/12   Bursitis of shoulder    GERD (gastroesophageal reflux disease)    Hearing loss, neural    Left ear   Hemorrhoids    History of Guillain-Barre syndrome 2003   Hyperlipidemia    Injury of tendon of long head of biceps    Left axis deviation 1999   Osteoarthritis    Peptic ulcer 1964   with bleed ; transfused   Prediabetes 07/02/2008   Skin cancer     Patient Active Problem List   Diagnosis Date Noted   Incisional hernia with obstruction 07/16/2020   Morbid obesity ([place]) 07/25/2018   Allergic rhinitis 04/28/2014   HTN (hypertension) 08/25/2011   Atrial flutter ([place]) 02/09/2011   BPH (benign prostatic hyperplasia) 07/08/2009   SKIN CANCER, HX OF 07/08/2009   Hyperlipidemia 07/02/2008   GERD 07/02/2008   NOCTURIA 07/02/2008   Prediabetes 07/02/2008   GUILLAIN-BARRE SYNDROME 03/01/2007   Osteoarthritis 03/01/2007    Past Surgical History:  Procedure Laterality Date   ABLATION OF DYSRHYTHMIC FOCUS  2012    for AF;Dr Caryl Comes   colonoscopy with polypectomy  2011   Internal hemorrhoids; [facility]   INGUINAL HERNIA  REPAIR  07/16/2020   Procedure: DIAGNOSTIC LAPAROSCOPY, LAPAROSCOPIC ABDOMINAL WALL HERNIA REPAIR WITH MESH;  Surgeon: Alphonsa Overall, MD;  Location: [facility];  Service: General;;   INTRAOCULAR LENS INSERTION     MELANOMA EXCISION     on scalp January 2019   REPLACEMENT TOTAL KNEE  2000   Left Knee   TOTAL KNEE ARTHROPLASTY  09/21/2011   Procedure: TOTAL KNEE ARTHROPLASTY;  Surgeon: Tobi Bastos;  Location: [facility];  Service: Orthopedics;  Laterality: Right;   VASECTOMY     Scar tissue removed 2 years after the vasectomy       Family History  Problem Relation Age of Onset   Heart attack Father 22   Heart failure Father        Died @ 75   Lupus Mother    Heart attack Brother 1       MI @ 55;.CABG @ 70   Diabetes Brother    Prostate cancer Paternal Grandfather        55   Cancer Maternal Uncle        X2, unknown primary   Stroke Maternal Grandmother        > 65   Peripheral vascular disease Brother        S/P leg amputation   Colon cancer Neg Hx    Stomach cancer Neg Hx     Social History   Tobacco Use  Smoking status: Former Smoker    Quit date: 10/24/1992    Years since quitting: 27.7   Smokeless tobacco: Never Used   Tobacco comment: smoked 1960-1994, up to < 1 ppd  Substance Use Topics   Alcohol use: Yes    Comment: wine   Drug use: No    Home Medications Prior to Admission medications   Medication Sig Start Date End Date Taking? Authorizing Provider  aspirin 325 MG EC tablet Take 325 mg by mouth daily.    [provider]  docusate sodium (COLACE) 100 MG capsule Take 1 capsule (100 mg total) by mouth 2 (two) times daily. 9/79/89   Leighton Ruff, MD  esomeprazole (NEXIUM) 40 MG capsule Take 1 capsule (40 mg total) by mouth daily as needed. 07/24/17   Vivi Barrack, MD  ibuprofen (ADVIL,MOTRIN) 200 MG tablet Take 200 mg by mouth as needed.    [provider]  lisinopril (ZESTRIL) 20 MG tablet Take 1 tablet (20 mg total)  by mouth daily. 12/04/92   Leighton Ruff, MD  Melatonin 10 MG CAPS Take 1 capsule by mouth daily as needed.     [provider]  metFORMIN (GLUCOPHAGE XR) 750 MG 24 hr tablet Take 1 tablet (750 mg total) by mouth daily with breakfast. 04/21/20   Vivi Barrack, MD  Multiple Vitamin (MULTIVITAMIN) tablet Take 1 tablet by mouth daily.      [provider]  Omega-3 Fatty Acids (FISH OIL) 500 MG CAPS Take 1,000 mg by mouth once.     [provider]  pravastatin (PRAVACHOL) 40 MG tablet Take 0.5 tablets (20 mg total) by mouth daily. 12/16/19   Vivi Barrack, MD  traMADol (ULTRAM) 50 MG tablet Take 1 tablet (50 mg total) by mouth every 6 (six) hours as needed for moderate pain. 1/74/08   Leighton Ruff, MD    Allergies    Patient has no known allergies.  Review of Systems   Review of Systems  Constitutional: Negative for chills, diaphoresis, fatigue and fever.  HENT: Negative for congestion and sore throat.   Respiratory: Negative for cough, chest tightness, shortness of breath and wheezing.   Cardiovascular: Negative for chest pain, palpitations and leg swelling.  Gastrointestinal: Positive for abdominal distention, constipation, nausea and vomiting. Negative for abdominal pain and diarrhea.  Genitourinary: Negative for flank pain.  Musculoskeletal: Negative for back pain, neck pain and neck stiffness.  Neurological: Negative for headaches.  Psychiatric/Behavioral: Negative for agitation.  All other systems reviewed and are negative.   Physical Exam Updated Vital Signs BP 139/79 (BP Location: Left Arm)    Pulse 88    Temp 98.1 F (36.7 C) (Oral)    Resp 16    SpO2 98%   Physical Exam Vitals and nursing note reviewed.  Constitutional:      General: He is not in acute distress.    Appearance: He is well-developed. He is not ill-appearing, toxic-appearing or diaphoretic.  HENT:     Head: Normocephalic and atraumatic.  Eyes:     General: No scleral icterus.     Conjunctiva/sclera: Conjunctivae normal.  Cardiovascular:     Rate and Rhythm: Normal rate and regular rhythm.     Heart sounds: No murmur heard.   Pulmonary:     Effort: Pulmonary effort is normal. No respiratory distress.     Breath sounds: Normal breath sounds.  Abdominal:     General: Abdomen is flat. Bowel sounds are normal. There is no distension.  Palpations: Abdomen is soft.     Tenderness: There is no abdominal tenderness. There is no right CVA tenderness, left CVA tenderness, guarding or rebound.  Musculoskeletal:     Cervical back: Neck supple.  Skin:    General: Skin is warm and dry.  Neurological:     Mental Status: He is alert and oriented to person, place, and time.  Psychiatric:        Mood and Affect: Mood normal.     ED Results / Procedures / Treatments   Labs (all labs ordered are listed, but only abnormal results are displayed) Labs Reviewed - No data to display  EKG None  Radiology No results found.  Procedures Procedures (including critical care time)  Medications Ordered in ED Medications - No data to display  ED Course  I have reviewed the triage vital signs and the nursing notes.  Pertinent labs & imaging results that were available during my care of the patient were reviewed by me and considered in my medical decision making (see chart for details).    MDM Rules/Calculators/A&P                          JAIMEN MELONE is a 79 y.o. male with a past medical history significant for hypertension, hyperlipidemia, atrial flutter, GERD, and recent hernia surgery who presents for nausea, vomiting, and abdominal distention.  Patient reports that he had abdominal surgery 3 days ago and was discharged yesterday.  He said that discharge, he was able to eat and drink but never had a bowel movement.  He said that throughout the day he started having nausea and vomiting cannot keep foods down.  He was feeling somewhat dehydrated.  He said that his  abdomen felt distended but was not significantly hurting.  When he still having nausea this morning, he decided to come in for evaluation.  While awaiting evaluation this morning, patient went had a very large normal bowel movement and reports that his symptoms have all resolved.  He denies any nausea at this time, distention, and still has no abdominal pain.  He reports that if he had had a bowel movement before deciding to the emergency department he would not of come.  He denies fevers, chills, chest pain, shortness of breath, diarrhea or significant urinary changes.  He denies other trauma.  He denies any leg pain or leg swelling.  No syncope.  On exam, abdomen is nontender.  Normal bowel sounds.  Lungs clear and chest nontender.  Back nontender.  No flank tenderness.  Good pulses in extremities.  Patient has normal gait and is resting comfortably.  Had a shared decision making conversation with patient and family.  We discussed that if he was still having nausea, vomiting, or distention, or discomfort, we would likely consider doing labs and a CT scan.  Now that he had a normal bowel movement and is feeling back to baseline, we agreed to do a p.o. challenge and give him some gentle food and fluids and if he has resolution of symptoms and is monitored for period of time, anticipate discharge to follow-up with his general surgery team.  Patient agrees with this plan.  Anticipate discharge if he does well.  8:13 AM Patient tolerated p.o. challenge without difficulty.  He still feels well and would like to go home.  Patient discharged to follow-up with general surgery and return if symptoms change or worsen.  Patient discharged in good condition.  Final Clinical Impression(s) / ED Diagnoses Final diagnoses:  Non-intractable vomiting with nausea, unspecified vomiting type  Abdominal distension    Rx / DC Orders ED Discharge Orders    None     Clinical Impression: 1. Non-intractable  vomiting with nausea, unspecified vomiting type   2. Abdominal distension     Disposition: Discharge  Condition: Good  I have discussed the results, Dx and Tx plan with the pt(& family if present). He/she/they expressed understanding and agree(s) with the plan. Discharge instructions discussed at great length. Strict return precautions discussed and pt &/or family have verbalized understanding of the instructions. No further questions at time of discharge.    New Prescriptions   No medications on file    Follow Up: your general surgeon        Sundus Pete, Gwenyth Allegra, MD 07/19/20 [phone]

## 2020-07-19 NOTE — Discharge Instructions (Signed)
Your history and exam today or consistent with some postoperative nausea and vomiting before you had your initial bowel movement.  Now that you have had your bowel movement and your symptoms have resolved, you tolerated eating and drinking, and we observed you for period of time without return of symptoms.  As you feel better, we feel you are safe for discharge home.  Please follow-up with your general surgery team for further management.  Please continue maintaining hydration at home.  If any symptoms change or worsen including recurrent nausea, vomiting, abdominal pain or distention, please return to the nearest emergency department immediately.

## 2020-07-19 NOTE — ED Triage Notes (Signed)
Per pt, states abdominal distention-s/p laparoscopic abdominal surgery

## 2020-07-20 ENCOUNTER — Telehealth: Payer: Self-pay

## 2020-07-20 NOTE — Telephone Encounter (Cosign Needed)
Transition Care Management Follow-up Telephone Call  Date of discharge and from where: 07/18/20 [facility]  How have you been since you were released from the hospital? On the road to recovery  Any questions or concerns? No  Items Reviewed:  Did the pt receive and understand the discharge instructions provided? Yes   Medications obtained and verified? Yes   Any new allergies since your discharge? No   Dietary orders reviewed? Yes  Do you have support at home? Yes   Functional Questionnaire: (I = Independent and D = Dependent) ADLs: I   Bathing/Dressing- I  Meal Prep- I  Eating- I  Maintaining continence- I, pt stated he was able to urinate today without difficulty  Transferring/Ambulation- I  Managing Meds- I  Follow up appointments reviewed:   PCP Hospital f/u appt confirmed? Pt will be called to schedule Hospital follow up with [facility] f/u appt confirmed? Yes  Scheduled on 08/13/20  Are transportation arrangements needed? No   If their condition worsens, is the pt aware to call PCP or go to the Emergency Dept.? Yes  Was the patient provided with contact information for the PCP's office or ED? Yes  Was to pt encouraged to call back with questions or concerns? Yes

## 2020-07-20 NOTE — Progress Notes (Signed)
Pt's BP during AM rounds 201/89, not symptomatic. On call general surgeon was notified , [place]. Lopressor 5 mg x1 was ordered. BP after the intervention went down-192/87.

## 2020-07-22 ENCOUNTER — Telehealth: Payer: Self-pay

## 2020-07-22 NOTE — Telephone Encounter (Signed)
See below, Ok to schedule with another provider that has openings.

## 2020-07-22 NOTE — Telephone Encounter (Signed)
Patient decided against the appointment, as his surgeon has prescribed some medication.

## 2020-07-22 NOTE — Telephone Encounter (Signed)
Are there any other providers with openings?  Algis Greenhouse. [name], MD 07/22/2020 9:00 AM

## 2020-07-22 NOTE — Telephone Encounter (Signed)
Patient is scheduled with Dr.Parker tomorrow at 3.

## 2020-07-22 NOTE — Telephone Encounter (Signed)
Please advise 

## 2020-07-22 NOTE — Telephone Encounter (Signed)
Patient is calling in stating that he had surgery recently, and has been unable to keep anything down water or food and is asking for an appointment today. No openings, can we overbook?

## 2020-07-22 NOTE — Telephone Encounter (Signed)
Noted  

## 2020-07-23 ENCOUNTER — Ambulatory Visit: Payer: PPO | Admitting: Family Medicine

## 2020-07-27 ENCOUNTER — Other Ambulatory Visit: Payer: Self-pay | Admitting: *Deleted

## 2020-07-27 MED ORDER — PRAVASTATIN SODIUM 40 MG PO TABS
20.0000 mg | ORAL_TABLET | Freq: Every day | ORAL | 1 refills | Status: DC
Start: 2020-07-27 — End: 2020-12-14

## 2020-07-28 ENCOUNTER — Ambulatory Visit (INDEPENDENT_AMBULATORY_CARE_PROVIDER_SITE_OTHER): Payer: PPO | Admitting: Family Medicine

## 2020-07-28 ENCOUNTER — Other Ambulatory Visit: Payer: Self-pay

## 2020-07-28 VITALS — BP 119/76 | HR 85 | Temp 97.2°F | Ht 72.0 in | Wt 262.4 lb

## 2020-07-28 DIAGNOSIS — R7303 Prediabetes: Secondary | ICD-10-CM | POA: Diagnosis not present

## 2020-07-28 DIAGNOSIS — I10 Essential (primary) hypertension: Secondary | ICD-10-CM

## 2020-07-28 DIAGNOSIS — Z23 Encounter for immunization: Secondary | ICD-10-CM

## 2020-07-28 DIAGNOSIS — K460 Unspecified abdominal hernia with obstruction, without gangrene: Secondary | ICD-10-CM | POA: Diagnosis not present

## 2020-07-28 DIAGNOSIS — H6192 Disorder of left external ear, unspecified: Secondary | ICD-10-CM

## 2020-07-28 LAB — POCT GLYCOSYLATED HEMOGLOBIN (HGB A1C): Hemoglobin A1C: 6 % — AB (ref 4.0–5.6)

## 2020-07-28 MED ORDER — CIPROFLOXACIN-DEXAMETHASONE 0.3-0.1 % OT SUSP: 4.0000 [drp] | Freq: Two times a day (BID) | OTIC | 0 refills | Status: DC

## 2020-07-28 NOTE — Assessment & Plan Note (Signed)
At goal  Continue lisinopril 20mg daily

## 2020-07-28 NOTE — Patient Instructions (Signed)
It was very nice to see you today!  I am glad that you are feeling better.  I will send in some eardrops to your pharmacy.  Please try these if your symptoms do not improving.  Your blood sugar looks good today.  I will see you back in February for your annual checkup please come back to see me sooner if needed.  Take care, Dr [name]  Please try these tips to maintain a healthy lifestyle:   Eat at least 3 REAL meals and 1-2 snacks per day.  Aim for no more than 5 hours between eating.  If you eat breakfast, please do so within one hour of getting up.    Each meal should contain half fruits/vegetables, one quarter protein, and one quarter carbs (no bigger than a computer mouse)   Cut down on sweet beverages. This includes juice, soda, and sweet tea.     Drink at least 1 glass of water with each meal and aim for at least 8 glasses per day   Exercise at least 150 minutes every week.

## 2020-07-28 NOTE — Assessment & Plan Note (Signed)
A1c: 6.0%

## 2020-07-28 NOTE — Progress Notes (Signed)
Chief Complaint:  Troy Garrison is a 79 y.o. male who presents today for a TCM visit.  Assessment/Plan:  New/Acute Problems: EAC Irritation Debrox placed today in office per patient request.  No red flags.  Has small amount of irritation on exam.  Will start Ciprodex drops if not improving.   incarcerated hernia Seems to be recovering normally.  We will following up with surgery next week.  Having normal bowel movements and tolerating diet.  Chronic Problems Addressed Today: Prediabetes A1c 6.0.   HTN (hypertension) At goal.  Continue lisinopril 20 mg daily.     Subjective:  HPI:  Summary of Hospital admission: Reason for admission: Incarcerated hernia Date of admission: 07/16/2020 Date of discharge: 07/18/2020 Date of Interactive contact: 07/20/2020 Summary of Hospital course: Patient presented to the ED on 07/16/2020 with nausea, vomiting, and right lower quadrant pain.  CT scan showed incarcerated right lower quadrant hernia.  He was admitted to general surgery.  Underwent laparoscopic repair of abdominal wall hernia.  Tolerated well.  He was discharged on post operative day 2 in stable condition.  Interim history outlined by problem:   After being discharged from the hospital he went back to the emergency room today after work with nausea and vomiting.  While emergency room had a large bowel movement with resolution of symptoms.  He has been doing well for the last several days.  Able to eat normally.  Did have an issue with urinary retention that is since resolved.  No further nausea or vomiting.  He has had some irritation in his left EAC.  No specific treatments tried.  He was also worried about potential elevated blood pressure and blood sugar all the hospital.  He would like to have his blood sugar checked today.   ROS: Per HPI, otherwise a complete review of systems was negative.   PMH:  The following were reviewed and entered/updated in epic: Past Medical  History:  Diagnosis Date  . Atrial flutter ([place])    S/P RFCA with Dr. Caryl Comes 6/12  . Bursitis of shoulder   . GERD (gastroesophageal reflux disease)   . Hearing loss, neural    Left ear  . Hemorrhoids   . History of Guillain-Barre syndrome 2003  . Hyperlipidemia   . Injury of tendon of long head of biceps   . Left axis deviation 1999  . Osteoarthritis   . Peptic ulcer 1964   with bleed ; transfused  . Prediabetes 07/02/2008  . Skin cancer    Patient Active Problem List   Diagnosis Date Noted  . Morbid obesity ([place]) 07/25/2018  . Allergic rhinitis 04/28/2014  . HTN (hypertension) 08/25/2011  . Atrial flutter ([place]) 02/09/2011  . BPH (benign prostatic hyperplasia) 07/08/2009  . SKIN CANCER, HX OF 07/08/2009  . Hyperlipidemia 07/02/2008  . GERD 07/02/2008  . NOCTURIA 07/02/2008  . Prediabetes 07/02/2008  . GUILLAIN-BARRE SYNDROME 03/01/2007  . Osteoarthritis 03/01/2007   Past Surgical History:  Procedure Laterality Date  . ABLATION OF DYSRHYTHMIC FOCUS  2012    for AF;Dr Caryl Comes  . colonoscopy with polypectomy  2011   Internal hemorrhoids; [facility]  . INGUINAL HERNIA REPAIR  07/16/2020   Procedure: DIAGNOSTIC LAPAROSCOPY, LAPAROSCOPIC ABDOMINAL WALL HERNIA REPAIR WITH MESH;  Surgeon: Alphonsa Overall, MD;  Location: [facility];  Service: General;;  . INTRAOCULAR LENS INSERTION    . MELANOMA EXCISION     on scalp January 2019  . REPLACEMENT TOTAL KNEE  2000   Left Knee  .  TOTAL KNEE ARTHROPLASTY  09/21/2011   Procedure: TOTAL KNEE ARTHROPLASTY;  Surgeon: Tobi Bastos;  Location: [facility];  Service: Orthopedics;  Laterality: Right;  Marland Kitchen VASECTOMY     Scar tissue removed 2 years after the vasectomy    Family History  Problem Relation Age of Onset  . Heart attack Father 36  . Heart failure Father        Died @ 38  . Lupus Mother   . Heart attack Brother 8       MI @ 21;.CABG @ 54  . Diabetes Brother   . Prostate cancer Paternal Grandfather        11  . Cancer Maternal  Uncle        X2, unknown primary  . Stroke Maternal Grandmother        > 65  . Peripheral vascular disease Brother        S/P leg amputation  . Colon cancer Neg Hx   . Stomach cancer Neg Hx     Medications- Reconciled discharge and current medications in Epic.  Current Outpatient Medications  Medication Sig Dispense Refill  . aspirin 325 MG EC tablet Take 325 mg by mouth daily.    Marland Kitchen esomeprazole (NEXIUM) 40 MG capsule Take 1 capsule (40 mg total) by mouth daily as needed. 90 capsule 3  . ibuprofen (ADVIL,MOTRIN) 200 MG tablet Take 200 mg by mouth as needed.    Marland Kitchen lisinopril (ZESTRIL) 20 MG tablet Take 1 tablet (20 mg total) by mouth daily.    . Melatonin 10 MG CAPS Take 1 capsule by mouth daily as needed.     . Multiple Vitamin (MULTIVITAMIN) tablet Take 1 tablet by mouth daily.      . Omega-3 Fatty Acids (FISH OIL) 500 MG CAPS Take 1,000 mg by mouth once.     . pravastatin (PRAVACHOL) 40 MG tablet Take 0.5 tablets (20 mg total) by mouth daily. 45 tablet 1  . ciprofloxacin-dexamethasone (CIPRODEX) OTIC suspension Place 4 drops into both ears 2 (two) times daily. 7.5 mL 0   No current facility-administered medications for this visit.    Allergies-reviewed and updated No Known Allergies  Social History   Socioeconomic History  . Marital status: Married    Spouse name: Not on file  . Number of children: 5  . Years of education: Not on file  . Highest education level: Not on file  Occupational History  . 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 Smoking status: Former Smoker    Quit date: 10/24/1992    Years since quitting: 27.7  . Smokeless tobacco: Never Used  . Tobacco comment: smoked 1960-1994, up to < 1 ppd  Substance and Sexual Activity  . Alcohol use: Yes    Comment: wine  . Drug use: No  . Sexual activity: Not on file  Other Topics Concern  . Not on file  Social History Narrative   10 grandchildren    Social Determinants of  Health   Financial Resource Strain:   . Difficulty of Paying Living Expenses: Not on file  Food Insecurity:   . Worried About Charity fundraiser in the Last Year: Not on file  . Ran Out of Food in the Last Year: Not on file  Transportation Needs:   . Lack of Transportation (Medical): Not on file  . Lack of Transportation (Non-Medical): Not on file  Physical Activity:   . Days of Exercise  per Week: Not on file  . Minutes of Exercise per Session: Not on file  Stress:   . Feeling of Stress : Not on file  Social Connections:   . Frequency of Communication with Friends and Family: Not on file  . Frequency of Social Gatherings with Friends and Family: Not on file  . Attends Religious Services: Not on file  . Active Member of Clubs or Organizations: Not on file  . Attends Archivist Meetings: Not on file  . Marital Status: Not on file        Objective:  Physical Exam: BP 119/76   Pulse 85   Temp (!) 97.2 F (36.2 C) (Temporal)   Ht 6' (1.829 m)   Wt 262 lb 6.4 oz (119 kg)   SpO2 97%   BMI 35.59 kg/m    Gen: NAD, resting comfortably Left EAC with moderate erythema and cerumen CV: RRR with no murmurs appreciated Pulm: NWOB, CTAB with no crackles, wheezes, or rhonchi GI: Normal bowel sounds present. Soft, Nontender, Nondistended. MSK: No edema, cyanosis, or clubbing noted Skin: Warm, dry Neuro: Grossly normal, moves all extremities Psych: Normal affect and thought content  Time Spent: 45 minutes of total time was spent on the date of the encounter performing the following actions: chart review prior to seeing the patient including recent hospitalization, obtaining history, performing a medically necessary exam, counseling on the treatment plan, placing orders, and documenting in our EHR.        Algis Greenhouse. [name], MD 07/28/2020 12:25 PM

## 2020-08-08 ENCOUNTER — Ambulatory Visit: Payer: PPO

## 2020-09-25 ENCOUNTER — Other Ambulatory Visit: Payer: Self-pay | Admitting: Family Medicine

## 2020-12-10 DIAGNOSIS — D1801 Hemangioma of skin and subcutaneous tissue: Secondary | ICD-10-CM | POA: Diagnosis not present

## 2020-12-10 DIAGNOSIS — Z85828 Personal history of other malignant neoplasm of skin: Secondary | ICD-10-CM | POA: Diagnosis not present

## 2020-12-10 DIAGNOSIS — L850 Acquired ichthyosis: Secondary | ICD-10-CM | POA: Diagnosis not present

## 2020-12-10 DIAGNOSIS — L821 Other seborrheic keratosis: Secondary | ICD-10-CM | POA: Diagnosis not present

## 2020-12-10 DIAGNOSIS — D225 Melanocytic nevi of trunk: Secondary | ICD-10-CM | POA: Diagnosis not present

## 2020-12-10 DIAGNOSIS — L814 Other melanin hyperpigmentation: Secondary | ICD-10-CM | POA: Diagnosis not present

## 2020-12-10 DIAGNOSIS — Z85820 Personal history of malignant melanoma of skin: Secondary | ICD-10-CM | POA: Diagnosis not present

## 2020-12-10 DIAGNOSIS — L570 Actinic keratosis: Secondary | ICD-10-CM | POA: Diagnosis not present

## 2020-12-14 ENCOUNTER — Other Ambulatory Visit: Payer: Self-pay | Admitting: *Deleted

## 2020-12-14 MED ORDER — PRAVASTATIN SODIUM 40 MG PO TABS
20.0000 mg | ORAL_TABLET | Freq: Every day | ORAL | 1 refills | Status: DC
Start: 2020-12-14 — End: 2021-04-28

## 2020-12-16 ENCOUNTER — Other Ambulatory Visit: Payer: Self-pay

## 2020-12-16 ENCOUNTER — Ambulatory Visit (INDEPENDENT_AMBULATORY_CARE_PROVIDER_SITE_OTHER): Payer: PPO | Admitting: Family Medicine

## 2020-12-16 ENCOUNTER — Encounter: Payer: Self-pay | Admitting: Family Medicine

## 2020-12-16 VITALS — BP 145/76 | HR 64 | Temp 97.3°F | Ht 72.0 in | Wt 261.2 lb

## 2020-12-16 DIAGNOSIS — R351 Nocturia: Secondary | ICD-10-CM | POA: Diagnosis not present

## 2020-12-16 DIAGNOSIS — Z6835 Body mass index (BMI) 35.0-35.9, adult: Secondary | ICD-10-CM | POA: Diagnosis not present

## 2020-12-16 DIAGNOSIS — I10 Essential (primary) hypertension: Secondary | ICD-10-CM | POA: Diagnosis not present

## 2020-12-16 DIAGNOSIS — E785 Hyperlipidemia, unspecified: Secondary | ICD-10-CM | POA: Diagnosis not present

## 2020-12-16 DIAGNOSIS — E669 Obesity, unspecified: Secondary | ICD-10-CM

## 2020-12-16 DIAGNOSIS — Z0001 Encounter for general adult medical examination with abnormal findings: Secondary | ICD-10-CM

## 2020-12-16 DIAGNOSIS — R7303 Prediabetes: Secondary | ICD-10-CM

## 2020-12-16 DIAGNOSIS — H6123 Impacted cerumen, bilateral: Secondary | ICD-10-CM | POA: Diagnosis not present

## 2020-12-16 LAB — CBC
HCT: 46 % (ref 39.0–52.0)
Hemoglobin: 15.5 g/dL (ref 13.0–17.0)
MCHC: 33.6 g/dL (ref 30.0–36.0)
MCV: 89.7 fl (ref 78.0–100.0)
Platelets: 216 10*3/uL (ref 150.0–400.0)
RBC: 5.13 Mil/uL (ref 4.22–5.81)
RDW: 13.7 % (ref 11.5–15.5)
WBC: 6.7 10*3/uL (ref 4.0–10.5)

## 2020-12-16 LAB — COMPREHENSIVE METABOLIC PANEL
ALT: 22 U/L (ref 0–53)
AST: 20 U/L (ref 0–37)
Albumin: 4.3 g/dL (ref 3.5–5.2)
Alkaline Phosphatase: 65 U/L (ref 39–117)
BUN: 20 mg/dL (ref 6–23)
CO2: 27 mEq/L (ref 19–32)
Calcium: 9.6 mg/dL (ref 8.4–10.5)
Chloride: 101 mEq/L (ref 96–112)
Creatinine, Ser: 0.93 mg/dL (ref 0.40–1.50)
GFR: 79.14 mL/min (ref >60.00)
Glucose, Bld: 122 mg/dL — ABNORMAL HIGH (ref 70–99)
Potassium: 4.6 mEq/L (ref 3.5–5.1)
Sodium: 133 mEq/L — ABNORMAL LOW (ref 135–145)
Total Bilirubin: 0.6 mg/dL (ref 0.2–1.2)
Total Protein: 6.6 g/dL (ref 6.0–8.3)

## 2020-12-16 LAB — PSA: PSA: 1.13 ng/mL (ref 0.10–4.00)

## 2020-12-16 LAB — HEMOGLOBIN A1C: Hgb A1c MFr Bld: 6.6 % — ABNORMAL HIGH (ref 4.6–6.5)

## 2020-12-16 LAB — LIPID PANEL
Cholesterol: 124 mg/dL (ref 0–200)
HDL: 32.8 mg/dL — ABNORMAL LOW (ref >39.00)
LDL Cholesterol: 78 mg/dL (ref 0–99)
NonHDL: 91.65
Total CHOL/HDL Ratio: 4
Triglycerides: 70 mg/dL (ref 0.0–149.0)
VLDL: 14 mg/dL (ref 0.0–40.0)

## 2020-12-16 LAB — TSH: TSH: 2.72 u[IU]/mL (ref 0.35–4.50)

## 2020-12-16 NOTE — Progress Notes (Signed)
Chief Complaint:  Troy Garrison is a 80 y.o. male who presents today for his annual comprehensive physical exam.    Assessment/Plan:  New/Acute Problems: Cerumen Impaction Successfully Irrigated by RMA  Chronic Problems Addressed Today: Prediabetes Check A1c.  Discussed lifestyle modifications.  NOCTURIA Check PSA.  Hyperlipidemia Check lipids.  Continue pravastatin 20 mg daily.  HTN (hypertension) At goal per JNC 8.  Continue lisinopril 10 mg daily.  Check labs.  Body mass index is 35.43 kg/m. / Obese  BMI Metric Follow Up - 12/16/20 1041      BMI Metric Follow Up-Please document annually   BMI Metric Follow Up Education provided           Preventative Healthcare: Check labs. UTD on screenings and vaccines.   Patient Counseling(The following topics were reviewed and/or handout was given):  -Nutrition: Stressed importance of moderation in sodium/caffeine intake, saturated fat and cholesterol, caloric balance, sufficient intake of fresh fruits, vegetables, and fiber.  -Stressed the importance of regular exercise.   -Substance Abuse: Discussed cessation/primary prevention of tobacco, alcohol, or other drug use; driving or other dangerous activities under the influence; availability of treatment for abuse.   -Injury prevention: Discussed safety belts, safety helmets, smoke detector, smoking near bedding or upholstery.   -Sexuality: Discussed sexually transmitted diseases, partner selection, use of condoms, avoidance of unintended pregnancy and contraceptive alternatives.   -Dental health: Discussed importance of regular tooth brushing, flossing, and dental visits.  -Health maintenance and immunizations reviewed. Please refer to Health maintenance section.  Return to care in 1 year for next preventative visit.     Subjective:  HPI:  He has no acute complaints today.   Lifestyle Diet: Balanced. Plenty of fruits and vegetables.  Exercise: Does a lot of walking.    Depression screen PHQ 2/9 12/16/2020  Decreased Interest 0  Down, Depressed, Hopeless 0  PHQ - 2 Score 0    Health Maintenance Due  Topic Date Due  . Hepatitis C Screening  Never done  . COVID-19 Vaccine (3 - Booster for Pfizer series) 05/24/2020     ROS: Per HPI, otherwise a complete review of systems was negative.   PMH:  The following were reviewed and entered/updated in epic: Past Medical History:  Diagnosis Date  . Atrial flutter ([place])    S/P RFCA with Dr. Caryl Comes 6/12  . Bursitis of shoulder   . GERD (gastroesophageal reflux disease)   . Hearing loss, neural    Left ear  . Hemorrhoids   . History of Guillain-Barre syndrome 2003  . Hyperlipidemia   . Injury of tendon of long head of biceps   . Left axis deviation 1999  . Osteoarthritis   . Peptic ulcer 1964   with bleed ; transfused  . Prediabetes 07/02/2008  . Skin cancer    Patient Active Problem List   Diagnosis Date Noted  . Morbid obesity ([place]) 07/25/2018  . Allergic rhinitis 04/28/2014  . HTN (hypertension) 08/25/2011  . Atrial flutter ([place]) 02/09/2011  . BPH (benign prostatic hyperplasia) 07/08/2009  . SKIN CANCER, HX OF 07/08/2009  . Hyperlipidemia 07/02/2008  . GERD 07/02/2008  . NOCTURIA 07/02/2008  . Prediabetes 07/02/2008  . GUILLAIN-BARRE SYNDROME 03/01/2007  . Osteoarthritis 03/01/2007   Past Surgical History:  Procedure Laterality Date  . ABLATION OF DYSRHYTHMIC FOCUS  2012    for AF;Dr Caryl Comes  . colonoscopy with polypectomy  2011   Internal hemorrhoids; [facility]  . INGUINAL HERNIA REPAIR  07/16/2020   Procedure:  DIAGNOSTIC LAPAROSCOPY, LAPAROSCOPIC ABDOMINAL WALL HERNIA REPAIR WITH MESH;  Surgeon: Alphonsa Overall, MD;  Location: [facility];  Service: General;;  . INTRAOCULAR LENS INSERTION    . MELANOMA EXCISION     on scalp January 2019  . REPLACEMENT TOTAL KNEE  2000   Left Knee  . TOTAL KNEE ARTHROPLASTY  09/21/2011   Procedure: TOTAL KNEE ARTHROPLASTY;  Surgeon: Tobi Bastos;   Location: [facility];  Service: Orthopedics;  Laterality: Right;  Marland Kitchen VASECTOMY     Scar tissue removed 2 years after the vasectomy    Family History  Problem Relation Age of Onset  . Heart attack Father 46  . Heart failure Father        Died @ 71  . Lupus Mother   . Heart attack Brother 57       MI @ 3;.CABG @ 82  . Diabetes Brother   . Prostate cancer Paternal Grandfather        76  . Cancer Maternal Uncle        X2, unknown primary  . Stroke Maternal Grandmother        > 65  . Peripheral vascular disease Brother        S/P leg amputation  . Colon cancer Neg Hx   . Stomach cancer Neg Hx     Medications- reviewed and updated Current Outpatient Medications  Medication Sig Dispense Refill  . aspirin 325 MG EC tablet Take 325 mg by mouth daily.    Marland Kitchen esomeprazole (NEXIUM) 40 MG capsule Take 1 capsule (40 mg total) by mouth daily as needed. 90 capsule 3  . ibuprofen (ADVIL,MOTRIN) 200 MG tablet Take 200 mg by mouth as needed.    Marland Kitchen lisinopril (ZESTRIL) 10 MG tablet Take 1 tablet by mouth daily (new dose - deactivated 20mg ) 90 tablet 2  . Melatonin 10 MG CAPS Take 1 capsule by mouth daily as needed.     . Multiple Vitamin (MULTIVITAMIN) tablet Take 1 tablet by mouth daily.    . Omega-3 Fatty Acids (FISH OIL) 500 MG CAPS Take 1,000 mg by mouth once.    . pravastatin (PRAVACHOL) 40 MG tablet Take 0.5 tablets (20 mg total) by mouth daily. 45 tablet 1  . ciprofloxacin-dexamethasone (CIPRODEX) OTIC suspension Place 4 drops into both ears 2 (two) times daily. 7.5 mL 0   No current facility-administered medications for this visit.    Allergies-reviewed and updated No Known Allergies  Social History   Socioeconomic History  . Marital status: Married    Spouse name: Not on file  . Number of children: 5  . Years of education: Not on file  . Highest education level: Not on file  Occupational History  . 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  Smoking status: Former Smoker    Quit date: 10/24/1992    Years since quitting: 28.1  . Smokeless tobacco: Never Used  . Tobacco comment: smoked 1960-1994, up to < 1 ppd  Substance and Sexual Activity  . Alcohol use: Yes    Comment: wine  . Drug use: No  . Sexual activity: Not on file  Other Topics Concern  . Not on file  Social History Narrative   10 grandchildren    Social Determinants of Health   Financial Resource Strain: Not on file  Food Insecurity: Not on file  Transportation Needs: Not on file  Physical Activity: Not on file  Stress: Not on file  Social Connections: Not on file        Objective:  Physical Exam: BP (!) 145/76   Pulse 64   Temp (!) 97.3 F (36.3 C) (Temporal)   Ht 6' (1.829 m)   Wt 261 lb 3.2 oz (118.5 kg)   SpO2 99%   BMI 35.43 kg/m   Body mass index is 35.43 kg/m. Wt Readings from Last 3 Encounters:  12/16/20 261 lb 3.2 oz (118.5 kg)  07/28/20 262 lb 6.4 oz (119 kg)  07/15/20 265 lb (120.2 kg)   Gen: NAD, resting comfortably HEENT: EACs with cerumen bilaterally. OP clear. No thyromegaly noted.  CV: RRR with no murmurs appreciated Pulm: NWOB, CTAB with no crackles, wheezes, or rhonchi GI: Normal bowel sounds present. Soft, Nontender, Nondistended. MSK: no edema, cyanosis, or clubbing noted Skin: warm, dry Neuro: CN2-12 grossly intact. Strength 5/5 in upper and lower extremities. Reflexes symmetric and intact bilaterally.  Psych: Normal affect and thought content     Caleb M. [name], MD 12/16/2020 10:43 AM

## 2020-12-16 NOTE — Assessment & Plan Note (Signed)
Check PSA. ?

## 2020-12-16 NOTE — Assessment & Plan Note (Signed)
At goal per JNC 8.  Continue lisinopril 10 mg daily.  Check labs.

## 2020-12-16 NOTE — Assessment & Plan Note (Signed)
Check A1c.  Discussed lifestyle modifications. °

## 2020-12-16 NOTE — Assessment & Plan Note (Signed)
Check lipids.  Continue pravastatin 20 mg daily. 

## 2020-12-16 NOTE — Patient Instructions (Signed)
It was very nice to see you today!  We will check blood work today.  Keep working on diet and exercise.  I will see back in year for your next annual physical.  Please come back to see me sooner if needed.  Take care, Dr [name]  Please try these tips to maintain a healthy lifestyle:   Eat at least 3 REAL meals and 1-2 snacks per day.  Aim for no more than 5 hours between eating.  If you eat breakfast, please do so within one hour of getting up.    Each meal should contain half fruits/vegetables, one quarter protein, and one quarter carbs (no bigger than a computer mouse)   Cut down on sweet beverages. This includes juice, soda, and sweet tea.     Drink at least 1 glass of water with each meal and aim for at least 8 glasses per day   Exercise at least 150 minutes every week.    Preventive Care 33 Years and Older, Male Preventive care refers to lifestyle choices and visits with your health care provider that can promote health and wellness. This includes:  A yearly physical exam. This is also called an annual wellness visit.  Regular dental and eye exams.  Immunizations.  Screening for certain conditions.  Healthy lifestyle choices, such as: ? Eating a healthy diet. ? Getting regular exercise. ? Not using drugs or products that contain nicotine and tobacco. ? Limiting alcohol use. What can I expect for my preventive care visit? Physical exam Your health care provider will check your:  Height and weight. These may be used to calculate your BMI (body mass index). BMI is a measurement that tells if you are at a healthy weight.  Heart rate and blood pressure.  Body temperature.  Skin for abnormal spots. Counseling Your health care provider may ask you questions about your:  Past medical problems.  Family's medical history.  Alcohol, tobacco, and drug use.  Emotional well-being.  Home life and relationship well-being.  Sexual activity.  Diet, exercise,  and sleep habits.  History of falls.  Memory and ability to understand (cognition).  Work and work Statistician.  Access to firearms. What immunizations do I need? Vaccines are usually given at various ages, according to a schedule. Your health care provider will recommend vaccines for you based on your age, medical history, and lifestyle or other factors, such as travel or where you work.   What tests do I need? Blood tests  Lipid and cholesterol levels. These may be checked every 5 years, or more often depending on your overall health.  Hepatitis C test.  Hepatitis B test. Screening  Lung cancer screening. You may have this screening every year starting at age 75 if you have a 30-pack-year history of smoking and currently smoke or have quit within the past 15 years.  Colorectal cancer screening. ? All adults should have this screening starting at age 21 and continuing until age 8. ? Your health care provider may recommend screening at age 79 if you are at increased risk. ? You will have tests every 1-10 years, depending on your results and the type of screening test.  Prostate cancer screening. Recommendations will vary depending on your family history and other risks.  Genital exam to check for testicular cancer or hernias.  Diabetes screening. ? This is done by checking your blood sugar (glucose) after you have not eaten for a while (fasting). ? You may have this done every 1-3  years.  Abdominal aortic aneurysm (AAA) screening. You may need this if you are a current or former smoker.  STD (sexually transmitted disease) testing, if you are at risk. Follow these instructions at home: Eating and drinking  Eat a diet that includes fresh fruits and vegetables, whole grains, lean protein, and low-fat dairy products. Limit your intake of foods with high amounts of sugar, saturated fats, and salt.  Take vitamin and mineral supplements as recommended by your health care  provider.  Do not drink alcohol if your health care provider tells you not to drink.  If you drink alcohol: ? Limit how much you have to 0-2 drinks a day. ? Be aware of how much alcohol is in your drink. In the [place] one drink equals one 12 oz bottle of beer (355 mL), one 5 oz glass of wine (148 mL), or one 1 oz glass of hard liquor (44 mL).   Lifestyle  Take daily care of your teeth and gums. Brush your teeth every morning and night with fluoride toothpaste. Floss one time each day.  Stay active. Exercise for at least 30 minutes 5 or more days each week.  Do not use any products that contain nicotine or tobacco, such as cigarettes, e-cigarettes, and chewing tobacco. If you need help quitting, ask your health care provider.  Do not use drugs.  If you are sexually active, practice safe sex. Use a condom or other form of protection to prevent STIs (sexually transmitted infections).  Talk with your health care provider about taking a low-dose aspirin or statin.  Find healthy ways to cope with stress, such as: ? Meditation, yoga, or listening to music. ? Journaling. ? Talking to a trusted person. ? Spending time with friends and family. Safety  Always wear your seat belt while driving or riding in a vehicle.  Do not drive: ? If you have been drinking alcohol. Do not ride with someone who has been drinking. ? When you are tired or distracted. ? While texting.  Wear a helmet and other protective equipment during sports activities.  If you have firearms in your house, make sure you follow all gun safety procedures. What's next?  Visit your health care provider once a year for an annual wellness visit.  Ask your health care provider how often you should have your eyes and teeth checked.  Stay up to date on all vaccines. This information is not intended to replace advice given to you by your health care provider. Make sure you discuss any questions you have with your health care  provider. Document Revised: 07/09/2019 Document Reviewed: 10/04/2018 Elsevier Patient Education  2021 [employer].

## 2020-12-18 NOTE — Progress Notes (Signed)
Please inform patient of the following:  Labs are all stable compared to last year. His cholesterol is stable but his blood sugar is still elevated. I would like to recheck his blood sugar in 6 months. Do not need to make any changes to his treatment plan at this time. Would like for him to keep working on diet and exercise.  Troy Garrison. [name], MD 12/18/2020 8:33 AM

## 2021-02-17 DIAGNOSIS — M25552 Pain in left hip: Secondary | ICD-10-CM | POA: Diagnosis not present

## 2021-02-24 DIAGNOSIS — M25512 Pain in left shoulder: Secondary | ICD-10-CM | POA: Diagnosis not present

## 2021-03-26 IMAGING — CT CT ABD-PELV W/ CM
2 of 5 series · 16 of 46 positions shown, 18 images · IV contrast (OMNIPAQUE 300)
Comparison: None.

CLINICAL DATA: Right lower quadrant abdominal pain

EXAM:
CT ABDOMEN AND PELVIS WITH CONTRAST
TECHNIQUE: Multidetector CT imaging of the abdomen and pelvis was performed
using the standard protocol following bolus administration of
intravenous contrast.
CONTRAST:  100mL OMNIPAQUE IOHEXOL 300 MG/ML  SOLN

[Series 2: axial st · axial · 0.79mm/px · z∈[+797,+1217]mm · 13 of 98 slices shown, 15 images]
[im 7/98  soft-tissue]
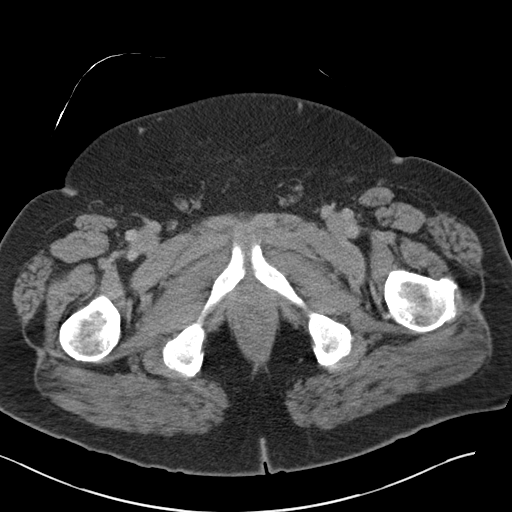
[im 7/98  bone]
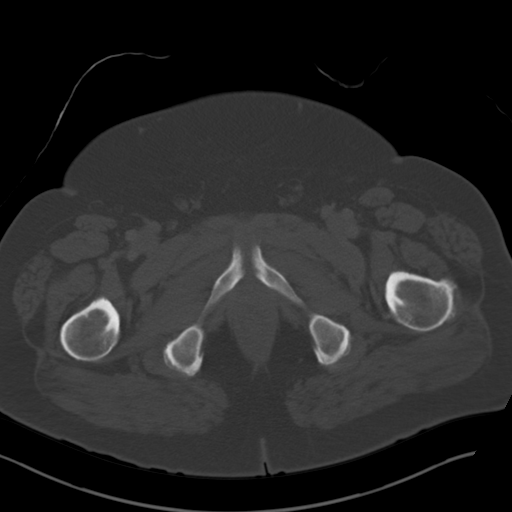
[im 13/98  soft-tissue]
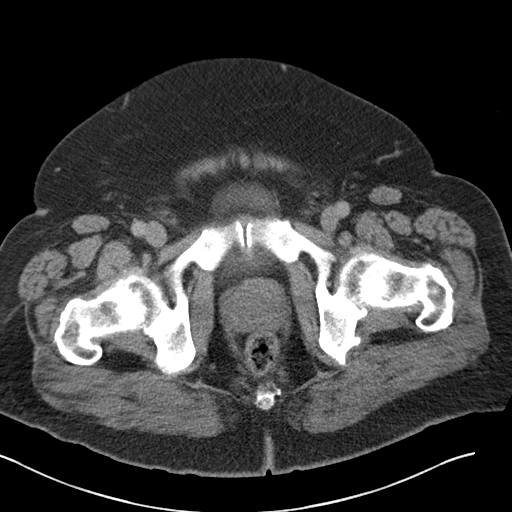
[im 19/98  soft-tissue]
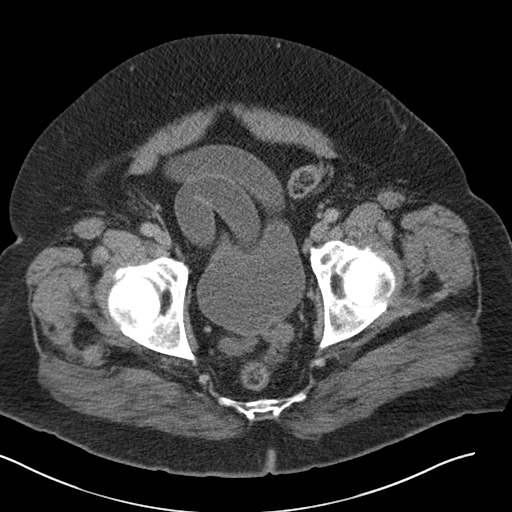
[im 31/98  soft-tissue]
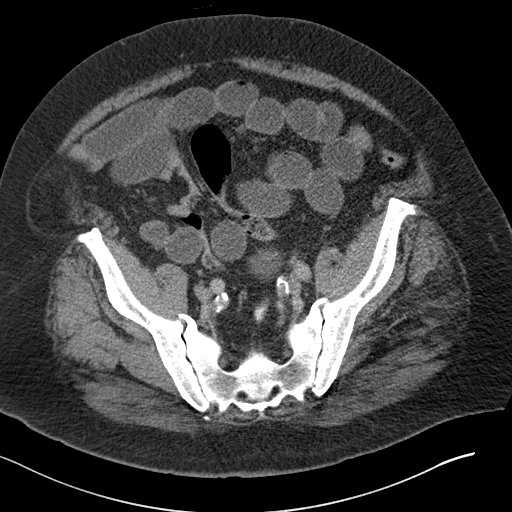
[im 37/98  soft-tissue]
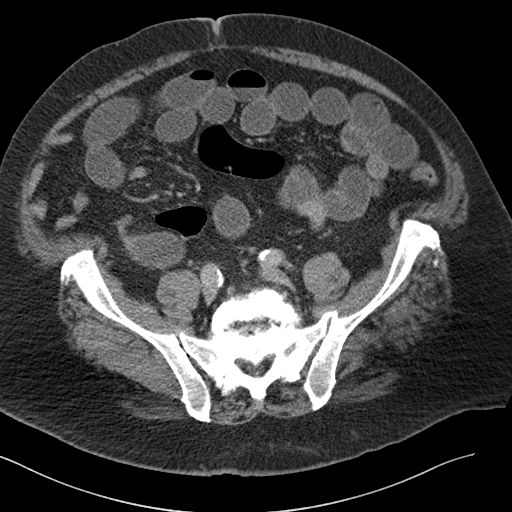
[im 43/98  soft-tissue]
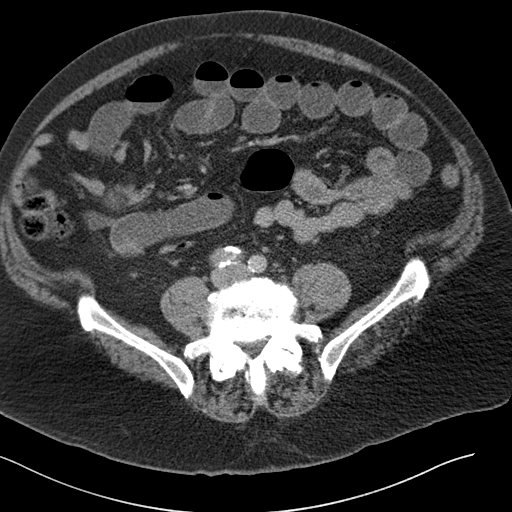
[im 49/98  soft-tissue]
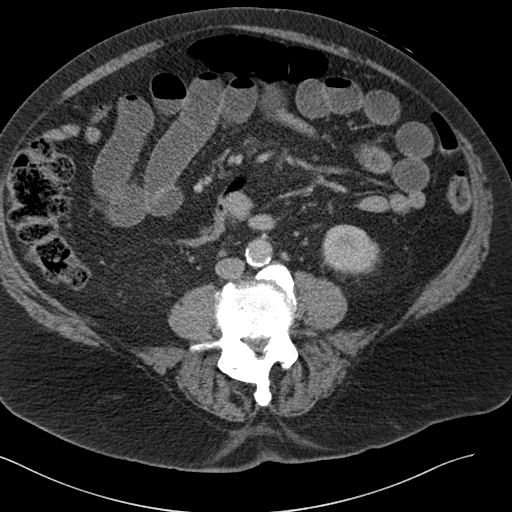
[im 55/98  soft-tissue]
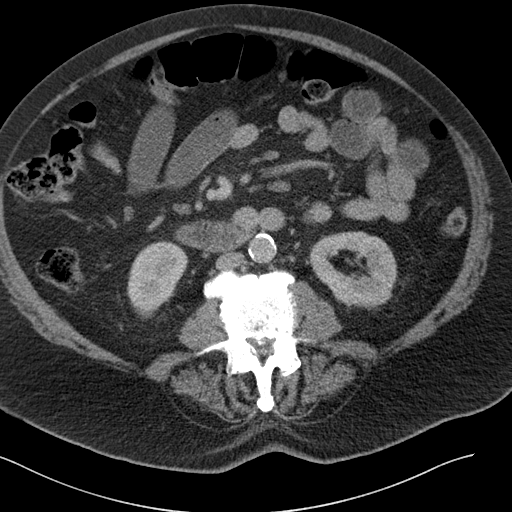
[im 61/98  soft-tissue]
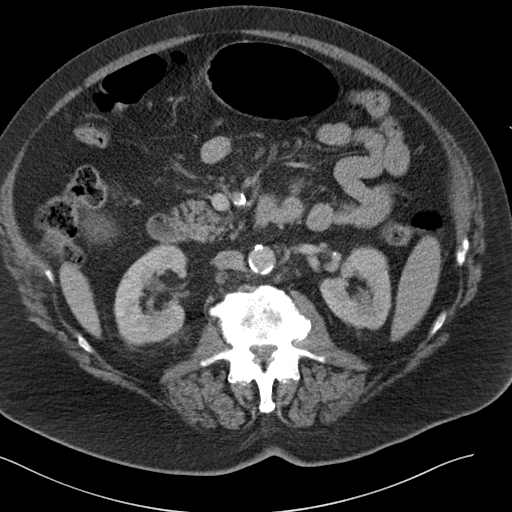
[im 61/98  bone]
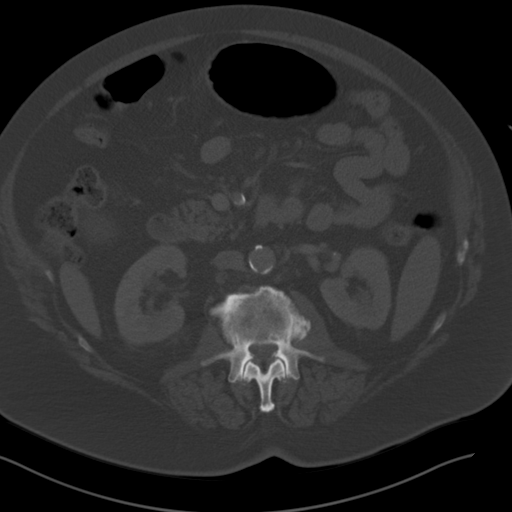
[im 67/98  soft-tissue]
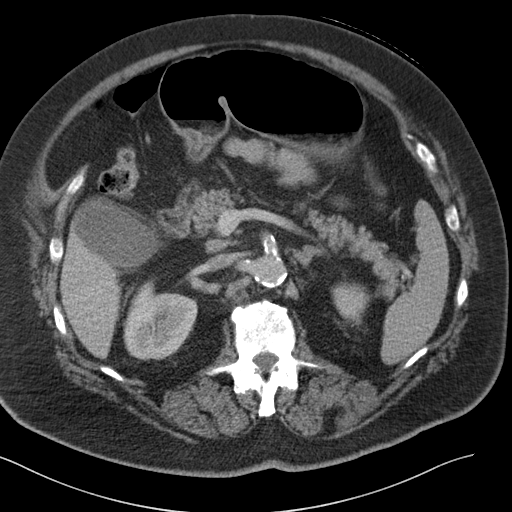
[im 79/98  soft-tissue]
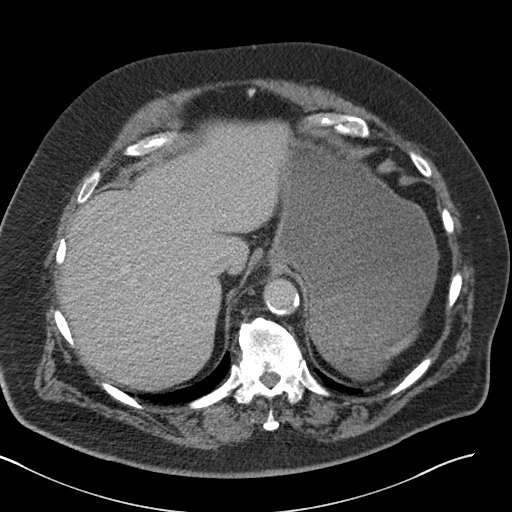
[im 85/98  soft-tissue]
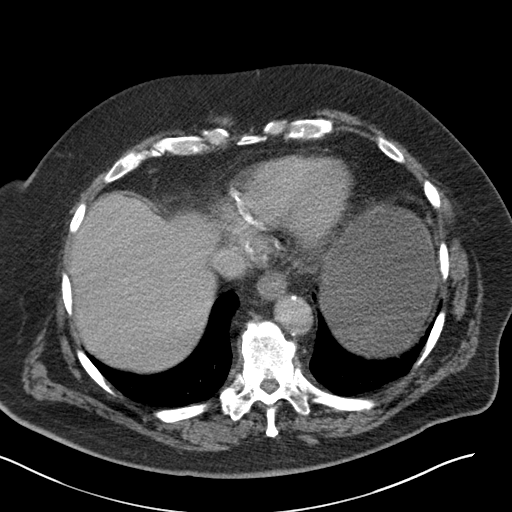
[im 91/98  soft-tissue]
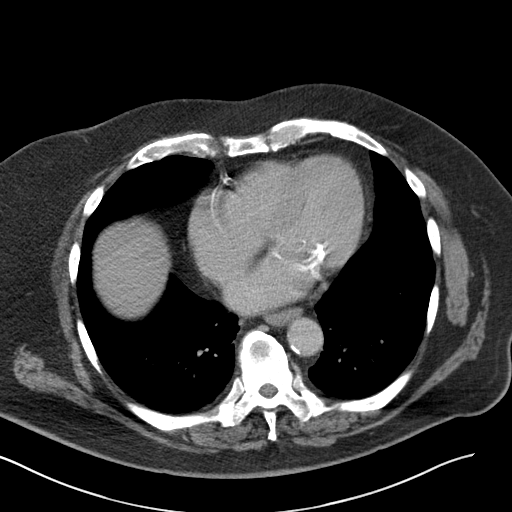

[Series 4: coronal st · coronal · 0.84mm/px · 3 of 176 slices shown]
[im 59/176  soft-tissue]
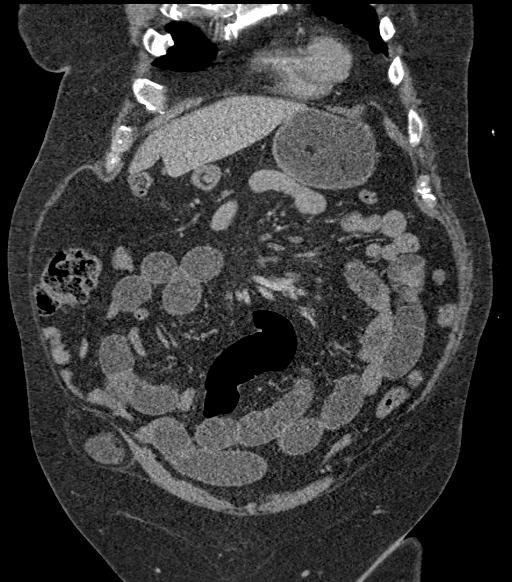
[im 78/176  soft-tissue]
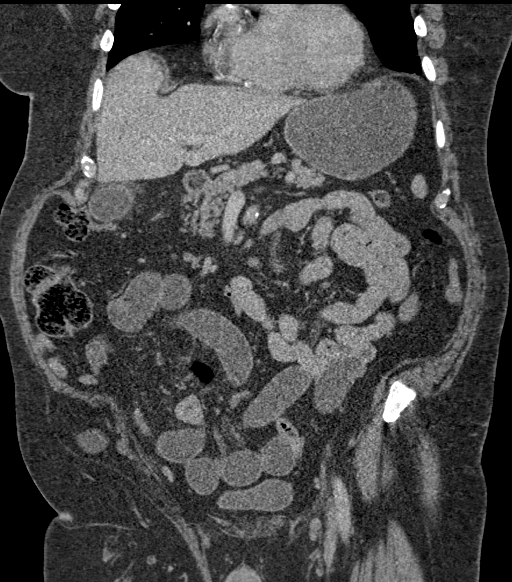
[im 98/176  soft-tissue]
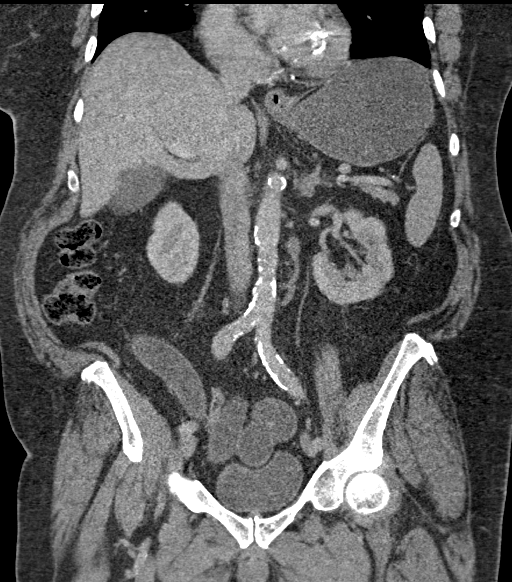

[16 of 46 positions shown; findings below may reference images not displayed]

FINDINGS: Lower chest: The visualized lung bases are clear bilaterally.
Extensive multi-vessel coronary artery calcification. Moderate
calcification of the mitral valve annulus. Global cardiac size
within normal limits. No pericardial effusion.

Hepatobiliary: The gallbladder is mildly distended and mild
pericholecystic inflammatory changes developed since prior
examination. Together, the findings are suggestive of acute
cholecystitis. The liver is unremarkable. No intra or extrahepatic
biliary ductal dilation.

Pancreas: Unremarkable

Spleen: Unremarkable

Adrenals/Urinary Tract: Adrenal glands are unremarkable. Kidneys are
normal, without renal calculi, focal lesion, or hydronephrosis.
Bladder is unremarkable.

Stomach/Bowel: The stomach is mildly distended. The proximal and mid
small bowel is mildly dilated and fluid-filled to the point of bowel
herniation into a right lower quadrant abdominal wall hernia. This
is best seen on axial image # 73 and sagittal image # 56. Bowel
within the hernia sac is mildly fluid distended. Distally, the small
bowel is decompressed. The large bowel is unremarkable. Appendix
normal. No free intraperitoneal gas or fluid.

Vascular/Lymphatic: Moderate aortoiliac atherosclerotic
calcification without evidence of aneurysm. No pathologic adenopathy
within the abdomen and pelvis.

Reproductive: Prostate is unremarkable.

Other: Rectum unremarkable.

Musculoskeletal: Advanced degenerative changes are seen within the
lumbar spine. No lytic or blastic bone lesions are seen.
IMPRESSION: Small-bowel obstruction secondary to herniated bowel within a right
lower quadrant abdominal wall hernia.

Pericholecystic inflammatory change suggestive of acute
cholecystitis. Correlation with liver enzymes and possible hepatic
biliary scintigraphy may be helpful to confirm this.

Aortic Atherosclerosis ([id number]).

## 2021-04-19 ENCOUNTER — Other Ambulatory Visit: Payer: Self-pay | Admitting: *Deleted

## 2021-04-19 MED ORDER — LISINOPRIL 10 MG PO TABS: 10.0000 mg | ORAL_TABLET | Freq: Every day | ORAL | 3 refills | Status: DC

## 2021-04-28 ENCOUNTER — Other Ambulatory Visit: Payer: Self-pay | Admitting: *Deleted

## 2021-04-28 MED ORDER — PRAVASTATIN SODIUM 40 MG PO TABS: 20.0000 mg | ORAL_TABLET | Freq: Every day | ORAL | 1 refills | Status: DC

## 2021-05-25 DIAGNOSIS — Z85828 Personal history of other malignant neoplasm of skin: Secondary | ICD-10-CM | POA: Diagnosis not present

## 2021-05-25 DIAGNOSIS — L309 Dermatitis, unspecified: Secondary | ICD-10-CM | POA: Diagnosis not present

## 2021-06-16 DIAGNOSIS — H40013 Open angle with borderline findings, low risk, bilateral: Secondary | ICD-10-CM | POA: Diagnosis not present

## 2021-06-16 DIAGNOSIS — M25512 Pain in left shoulder: Secondary | ICD-10-CM | POA: Diagnosis not present

## 2021-06-18 DIAGNOSIS — M25512 Pain in left shoulder: Secondary | ICD-10-CM | POA: Diagnosis not present

## 2021-06-29 DIAGNOSIS — M19112 Post-traumatic osteoarthritis, left shoulder: Secondary | ICD-10-CM | POA: Diagnosis not present

## 2021-06-29 DIAGNOSIS — M25512 Pain in left shoulder: Secondary | ICD-10-CM | POA: Diagnosis not present

## 2021-06-29 DIAGNOSIS — M75120 Complete rotator cuff tear or rupture of unspecified shoulder, not specified as traumatic: Secondary | ICD-10-CM | POA: Diagnosis not present

## 2021-07-27 ENCOUNTER — Other Ambulatory Visit: Payer: Self-pay

## 2021-07-27 ENCOUNTER — Ambulatory Visit (INDEPENDENT_AMBULATORY_CARE_PROVIDER_SITE_OTHER): Payer: PPO | Admitting: Family Medicine

## 2021-07-27 VITALS — BP 149/84 | HR 66 | Temp 97.4°F | Ht 72.0 in | Wt 261.4 lb

## 2021-07-27 DIAGNOSIS — M159 Polyosteoarthritis, unspecified: Secondary | ICD-10-CM

## 2021-07-27 DIAGNOSIS — Z01818 Encounter for other preprocedural examination: Secondary | ICD-10-CM | POA: Diagnosis not present

## 2021-07-27 DIAGNOSIS — R7303 Prediabetes: Secondary | ICD-10-CM

## 2021-07-27 DIAGNOSIS — I10 Essential (primary) hypertension: Secondary | ICD-10-CM | POA: Diagnosis not present

## 2021-07-27 LAB — CBC
HCT: 44.3 % (ref 39.0–52.0)
Hemoglobin: 15 g/dL (ref 13.0–17.0)
MCHC: 33.9 g/dL (ref 30.0–36.0)
MCV: 93.9 fl (ref 78.0–100.0)
Platelets: 190 10*3/uL (ref 150.0–400.0)
RBC: 4.72 Mil/uL (ref 4.22–5.81)
RDW: 13.6 % (ref 11.5–15.5)
WBC: 6.4 10*3/uL (ref 4.0–10.5)

## 2021-07-27 LAB — COMPREHENSIVE METABOLIC PANEL
ALT: 20 U/L (ref 0–53)
AST: 24 U/L (ref 0–37)
Albumin: 4.3 g/dL (ref 3.5–5.2)
Alkaline Phosphatase: 68 U/L (ref 39–117)
BUN: 14 mg/dL (ref 6–23)
CO2: 27 mEq/L (ref 19–32)
Calcium: 9.4 mg/dL (ref 8.4–10.5)
Chloride: 100 mEq/L (ref 96–112)
Creatinine, Ser: 0.78 mg/dL (ref 0.40–1.50)
GFR: 85.58 mL/min (ref >60.00)
Glucose, Bld: 127 mg/dL — ABNORMAL HIGH (ref 70–99)
Potassium: 4.7 mEq/L (ref 3.5–5.1)
Sodium: 135 mEq/L (ref 135–145)
Total Bilirubin: 0.6 mg/dL (ref 0.2–1.2)
Total Protein: 6.8 g/dL (ref 6.0–8.3)

## 2021-07-27 LAB — URINALYSIS, ROUTINE W REFLEX MICROSCOPIC
Bilirubin Urine: NEGATIVE
Hgb urine dipstick: NEGATIVE
Ketones, ur: NEGATIVE
Leukocytes,Ua: NEGATIVE
Nitrite: NEGATIVE
RBC / HPF: NONE SEEN (ref 0–?)
Specific Gravity, Urine: 1.01 (ref 1.000–1.030)
Total Protein, Urine: NEGATIVE
Urine Glucose: NEGATIVE
Urobilinogen, UA: 0.2 (ref 0.0–1.0)
WBC, UA: NONE SEEN (ref 0–?)
pH: 6.5 (ref 5.0–8.0)

## 2021-07-27 LAB — PROTIME-INR
INR: 1.1 ratio — ABNORMAL HIGH (ref 0.8–1.0)
Prothrombin Time: 11.7 s (ref 9.6–13.1)

## 2021-07-27 LAB — HEMOGLOBIN A1C: Hgb A1c MFr Bld: 6.6 % — ABNORMAL HIGH (ref 4.6–6.5)

## 2021-07-27 NOTE — Assessment & Plan Note (Signed)
Check A1c. 

## 2021-07-27 NOTE — Assessment & Plan Note (Signed)
Follows with orthopedics.  Will be having upcoming left shoulder replacement.

## 2021-07-27 NOTE — Progress Notes (Signed)
Troy Garrison is a 80 y.o. male who presents today for an office visit.  Assessment/Plan:  New/Acute Problems: Pre Op exam We will check requested labs today.  Patient has good functional status is overall low risk for neurovascular event.  Depending on results of lab he will be cleared for surgery.  Chronic Problems Addressed Today: Osteoarthritis Follows with orthopedics.  Will be having upcoming left shoulder replacement.  Prediabetes Check A1c.   HTN (hypertension) At goal per JNC-8.  Continue lisinopril 10 mg daily.  We will check labs today.     Subjective:  HPI:  He is here today for Pre-op evaluation for shoulder surgery at the request of Dr. Esmond Plants. He is having surgery on November 18 for left shoulder.  He denies chest pain or shortness of breath.  He has longstanding history of osteoarthritis.  He has failed to improve with conservative management.  He has excellent functional status.  No exertional chest pain or shortness of breath.  ROS: Per HPI, otherwise a complete review of systems was negative.   PMH:  The following were reviewed and entered/updated in epic: Past Medical History:  Diagnosis Date   Atrial flutter ([place])    S/P RFCA with Dr. Caryl Comes 6/12   Bursitis of shoulder    GERD (gastroesophageal reflux disease)    Hearing loss, neural    Left ear   Hemorrhoids    History of Guillain-Barre syndrome 2003   Hyperlipidemia    Injury of tendon of long head of biceps    Left axis deviation 1999   Osteoarthritis    Peptic ulcer 1964   with bleed ; transfused   Prediabetes 07/02/2008   Skin cancer    Patient Active Problem List   Diagnosis Date Noted   Morbid obesity ([place]) 07/25/2018   Allergic rhinitis 04/28/2014   HTN (hypertension) 08/25/2011   Atrial flutter ([place]) 02/09/2011   BPH (benign prostatic hyperplasia) 07/08/2009   SKIN CANCER, HX OF 07/08/2009   Hyperlipidemia 07/02/2008   GERD 07/02/2008   NOCTURIA 07/02/2008    Prediabetes 07/02/2008   GUILLAIN-BARRE SYNDROME 03/01/2007   Osteoarthritis 03/01/2007   Past Surgical History:  Procedure Laterality Date   ABLATION OF DYSRHYTHMIC FOCUS  2012    for AF;Dr Caryl Comes   colonoscopy with polypectomy  2011   Internal hemorrhoids; [facility]   INGUINAL HERNIA REPAIR  07/16/2020   Procedure: DIAGNOSTIC LAPAROSCOPY, LAPAROSCOPIC ABDOMINAL WALL HERNIA REPAIR WITH MESH;  Surgeon: Alphonsa Overall, MD;  Location: [facility];  Service: General;;   INTRAOCULAR LENS INSERTION     MELANOMA EXCISION     on scalp January 2019   REPLACEMENT TOTAL KNEE  2000   Left Knee   TOTAL KNEE ARTHROPLASTY  09/21/2011   Procedure: TOTAL KNEE ARTHROPLASTY;  Surgeon: Tobi Bastos;  Location: [facility];  Service: Orthopedics;  Laterality: Right;   VASECTOMY     Scar tissue removed 2 years after the vasectomy    Family History  Problem Relation Age of Onset   Heart attack Father 25   Heart failure Father        Died @ 64   Lupus Mother    Heart attack Brother 18       MI @ 77;.CABG @ 61   Diabetes Brother    Prostate cancer Paternal Grandfather        62   Cancer Maternal Uncle        X2, unknown primary   Stroke Maternal Grandmother        >  44   Peripheral vascular disease Brother        S/P leg amputation   Colon cancer Neg Hx    Stomach cancer Neg Hx     Medications- reviewed and updated Current Outpatient Medications  Medication Sig Dispense Refill   aspirin 325 MG EC tablet Take 325 mg by mouth daily.     esomeprazole (NEXIUM) 40 MG capsule Take 1 capsule (40 mg total) by mouth daily as needed. 90 capsule 3   ibuprofen (ADVIL,MOTRIN) 200 MG tablet Take 200 mg by mouth as needed.     lisinopril (ZESTRIL) 10 MG tablet Take 1 tablet (10 mg total) by mouth daily. 90 tablet 3   Melatonin 10 MG CAPS Take 1 capsule by mouth daily as needed.      Multiple Vitamin (MULTIVITAMIN) tablet Take 1 tablet by mouth daily.     Omega-3 Fatty Acids (FISH OIL) 500 MG CAPS Take 1,000 mg  by mouth once.     pravastatin (PRAVACHOL) 40 MG tablet Take 0.5 tablets (20 mg total) by mouth daily. 45 tablet 1   No current facility-administered medications for this visit.    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  Tobacco Use   Smoking status: Former    Types: Cigarettes    Quit date: 10/24/1992    Years since quitting: 28.7   Smokeless tobacco: Never   Tobacco comments:    smoked 1960-1994, up to < 1 ppd  Substance and Sexual Activity   Alcohol use: Yes    Comment: wine   Drug use: No   Sexual activity: Not on file  Other Topics Concern   Not on file  Social History Narrative   10 grandchildren    Social Determinants of Health   Financial Resource Strain: Not on file  Food Insecurity: Not on file  Transportation Needs: Not on file  Physical Activity: Not on file  Stress: Not on file  Social Connections: Not on file          Objective:  Physical Exam: BP (!) 149/84   Pulse 66   Temp (!) 97.4 F (36.3 C) (Temporal)   Ht 6' (1.829 m)   Wt 261 lb 6.4 oz (118.6 kg)   SpO2 100%   BMI 35.45 kg/m   Gen: No acute distress, resting comfortably CV: Regular rate and rhythm with no murmurs appreciated Pulm: Normal work of breathing, clear to auscultation bilaterally with no crackles, wheezes, or rhonchi Neuro: Grossly normal, moves all extremities Psych: Normal affect and thought content  EKG: NSR. No signs of ischemia. Stable compared to previous EKGs.        I,Savera Zaman,acting as a Education administrator for Dimas Chyle, MD.,have documented all relevant documentation on the behalf of Dimas Chyle, MD,as directed by  Dimas Chyle, MD while in the presence of Dimas Chyle, MD.    I, Dimas Chyle, MD, have reviewed all documentation for this visit. The documentation on 07/27/21 for the exam, diagnosis, procedures, and orders are all accurate and complete.  Algis Greenhouse. [name], MD 07/27/2021 10:51 AM

## 2021-07-27 NOTE — Patient Instructions (Addendum)
It was very nice to see you today!  We will check blood work and a urine sample today.   Once the results are back we will fax your clearance to Dr Veverly Fells.   Take care, Dr [name]  PLEASE NOTE:  If you had any lab tests please let us know if you have not heard back within a few days. You may see your results on mychart before we have a chance to review them but we will give you a call once they are reviewed by [place]. If we ordered any referrals today, please let us know if you have not heard from their office within the next week.   Please try these tips to maintain a healthy lifestyle:  Eat at least 3 REAL meals and 1-2 snacks per day.  Aim for no more than 5 hours between eating.  If you eat breakfast, please do so within one hour of getting up.   Each meal should contain half fruits/vegetables, one quarter protein, and one quarter carbs (no bigger than a computer mouse)  Cut down on sweet beverages. This includes juice, soda, and sweet tea.   Drink at least 1 glass of water with each meal and aim for at least 8 glasses per day  Exercise at least 150 minutes every week.

## 2021-07-27 NOTE — Assessment & Plan Note (Addendum)
At goal per JNC-8.  Continue lisinopril 10 mg daily.  We will check labs today.

## 2021-07-28 NOTE — Progress Notes (Signed)
Please inform patient of the following:  His labs are all stable. It is ok for [place] to send his pre-op clearance form. Do not need to make any changes to his treatment plan at this time.  Troy Garrison. [name], MD 07/28/2021 7:57 AM

## 2021-08-03 NOTE — Progress Notes (Signed)
Sent message, via epic in basket, requesting orders in epic from surgeon.  

## 2021-08-09 NOTE — Patient Instructions (Signed)
DUE TO COVID-19 ONLY ONE VISITOR IS ALLOWED TO COME WITH YOU AND STAY IN THE WAITING ROOM ONLY DURING PRE OP AND PROCEDURE.   **NO VISITORS ARE ALLOWED IN THE SHORT STAY AREA OR RECOVERY ROOM!!**  IF YOU WILL BE ADMITTED INTO THE HOSPITAL YOU ARE ALLOWED ONLY TWO SUPPORT PEOPLE DURING VISITATION HOURS ONLY (10AM -8PM)   The support person(s) may change daily. The support person(s) must pass our screening, gel in and out, and wear a mask at all times, including in the patient's room. Patients must also wear a mask when staff or their support person are in the room.  No visitors under the age of 46. Any visitor under the age of 32 must be accompanied by an adult.    COVID SWAB TESTING MUST BE COMPLETED ON: 08/11/21                  8A - 3P **MUST PRESENT COMPLETED FORM AT TESTING SITE**    [address] [place] (backside of the building) You are not required to quarantine, however you are required to wear a well-fitted mask when you are out and around people not in your household.  Hand Hygiene often Do NOT share personal items Notify your provider if you are in close contact with someone who has COVID or you develop fever 100.4 or greater, new onset of sneezing, cough, sore throat, shortness of breath or body aches.  [place], [address], must go inside of the hospital, NOT A DRIVE THRU!  (Must self quarantine after testing. Follow instructions on handout.)       Your procedure is scheduled on: 08/13/21   Report to [facility] Main Entrance    Report to admitting at : 9:45 AM   Call this number if you have problems the morning of surgery [phone]   Do not eat food :After Midnight.   May have liquids until: 9:30 AM  day of surgery  CLEAR LIQUID DIET  Foods Allowed                                                                     Foods Excluded  Water, Black Coffee and tea, regular and  decaf                             liquids that you cannot  Plain Jell-O in any flavor  (No red)                                           see through such as: Fruit ices (not with fruit pulp)                                     milk, soups, orange juice              Iced Popsicles (No red)  All solid food                                   Apple juices Sports drinks like Gatorade (No red) Lightly seasoned clear broth or consume(fat free) Sugar,  Sample Menu Breakfast                                Lunch                                     Supper Cranberry juice                    Beef broth                            Chicken broth Jell-O                                     Grape juice                           Apple juice Coffee or tea                        Jell-O                                      Popsicle                                                Coffee or tea                        Coffee or tea      Complete one Ensure drink the morning of surgery at : 9:30 AM      the day of surgery.  The day of surgery:  Drink ONE (1) Pre-Surgery Clear Ensure or G2 by am the morning of surgery. Drink in one sitting. Do not sip.  This drink was given to you during your hospital  pre-op appointment visit. Nothing else to drink after completing the  Pre-Surgery Clear Ensure or G2.          If you have questions, please contact your surgeon's office.     Oral Hygiene is also important to reduce your risk of infection.                                    Remember - BRUSH YOUR TEETH THE MORNING OF SURGERY WITH YOUR REGULAR TOOTHPASTE   Do NOT smoke after Midnight   Take these medicines the morning of surgery with A SIP OF WATER: esomeprazole.  DO NOT TAKE ANY ORAL DIABETIC MEDICATIONS DAY OF YOUR SURGERY  You may not have any metal on your body including hair pins, jewelry, and body piercing             Do not wear lotions,  powders, perfumes/cologne, or deodorant              Men may shave face and neck.   Do not bring valuables to the hospital. [place].   Contacts, dentures or bridgework may not be worn into surgery.   Bring small overnight bag day of surgery.    Patients discharged on the day of surgery will not be allowed to drive home.   Special Instructions: Bring a copy of your healthcare power of attorney and living will documents         the day of surgery if you haven't scanned them before.              Please read over the following fact sheets you were given: IF YOU HAVE QUESTIONS ABOUT YOUR PRE-OP INSTRUCTIONS PLEASE CALL [phone]   [facility] - Preparing for Surgery Before surgery, you can play an important role.  Because skin is not sterile, your skin needs to be as free of germs as possible.  You can reduce the number of germs on your skin by washing with CHG (chlorahexidine gluconate) soap before surgery.  CHG is an antiseptic cleaner which kills germs and bonds with the skin to continue killing germs even after washing. Please DO NOT use if you have an allergy to CHG or antibacterial soaps.  If your skin becomes reddened/irritated stop using the CHG and inform your nurse when you arrive at Short Stay. Do not shave (including legs and underarms) for at least 48 hours prior to the first CHG shower.  You may shave your face/neck. Please follow these instructions carefully:  1.  Shower with CHG Soap the night before surgery and the  morning of Surgery.  2.  If you choose to wash your hair, wash your hair first as usual with your  normal  shampoo.  3.  After you shampoo, rinse your hair and body thoroughly to remove the  shampoo.                           4.  Use CHG as you would any other liquid soap.  You can apply chg directly  to the skin and wash                       Gently with a scrungie or clean washcloth.  5.  Apply the CHG Soap to  your body ONLY FROM THE NECK DOWN.   Do not use on face/ open                           Wound or open sores. Avoid contact with eyes, ears mouth and genitals (private parts).                       Wash face,  Genitals (private parts) with your normal soap.             6.  Wash thoroughly, paying special attention to the area where your surgery  will be performed.  7.  Thoroughly rinse your body with warm water from  the neck down.  8.  DO NOT shower/wash with your normal soap after using and rinsing off  the CHG Soap.                9.  Pat yourself dry with a clean towel.            10.  Wear clean pajamas.            11.  Place clean sheets on your bed the night of your first shower and do not  sleep with pets. Day of Surgery : Do not apply any lotions/deodorants the morning of surgery.  Please wear clean clothes to the hospital/surgery center.  FAILURE TO FOLLOW THESE INSTRUCTIONS MAY RESULT IN THE CANCELLATION OF YOUR SURGERY PATIENT SIGNATURE_________________________________  NURSE SIGNATURE__________________________________  ________________________________________________________________________ [facility]- Preparing for Total Shoulder Arthroplasty    Before surgery, you can play an important role. Because skin is not sterile, your skin needs to be as free of germs as possible. You can reduce the number of germs on your skin by using the following products. Benzoyl Peroxide Gel Reduces the number of germs present on the skin Applied twice a day to shoulder area starting two days before surgery    ==================================================================  Please follow these instructions carefully:  BENZOYL PEROXIDE 5% GEL  Please do not use if you have an allergy to benzoyl peroxide.   If your skin becomes reddened/irritated stop using the benzoyl peroxide.  Starting two days before surgery, apply as follows: Apply benzoyl peroxide in the morning and at night. Apply  after taking a shower. If you are not taking a shower clean entire shoulder front, back, and side along with the armpit with a clean wet washcloth.  Place a quarter-sized dollop on your shoulder and rub in thoroughly, making sure to cover the front, back, and side of your shoulder, along with the armpit.   2 days before ____ AM   ____ PM              1 day before ____ AM   ____ PM                         Do this twice a day for two days.  (Last application is the night before surgery, AFTER using the CHG soap as described below).  Do NOT apply benzoyl peroxide gel on the day of surgery.   Incentive Spirometer  An incentive spirometer is a tool that can help keep your lungs clear and active. This tool measures how well you are filling your lungs with each breath. Taking long deep breaths may help reverse or decrease the chance of developing breathing (pulmonary) problems (especially infection) following: A long period of time when you are unable to move or be active. BEFORE THE PROCEDURE  If the spirometer includes an indicator to show your best effort, your nurse or respiratory therapist will set it to a desired goal. If possible, sit up straight or lean slightly forward. Try not to slouch. Hold the incentive spirometer in an upright position. INSTRUCTIONS FOR USE  Sit on the edge of your bed if possible, or sit up as far as you can in bed or on a chair. Hold the incentive spirometer in an upright position. Breathe out normally. Place the mouthpiece in your mouth and seal your lips tightly around it. Breathe in slowly and as deeply as possible, raising the piston or the ball toward the top  of the column. Hold your breath for 3-5 seconds or for as long as possible. Allow the piston or ball to fall to the bottom of the column. Remove the mouthpiece from your mouth and breathe out normally. Rest for a few seconds and repeat Steps 1 through 7 at least 10 times every 1-2 hours when you are  awake. Take your time and take a few normal breaths between deep breaths. The spirometer may include an indicator to show your best effort. Use the indicator as a goal to work toward during each repetition. After each set of 10 deep breaths, practice coughing to be sure your lungs are clear. If you have an incision (the cut made at the time of surgery), support your incision when coughing by placing a pillow or rolled up towels firmly against it. Once you are able to get out of bed, walk around indoors and cough well. You may stop using the incentive spirometer when instructed by your caregiver.  RISKS AND COMPLICATIONS Take your time so you do not get dizzy or light-headed. If you are in pain, you may need to take or ask for pain medication before doing incentive spirometry. It is harder to take a deep breath if you are having pain. AFTER USE Rest and breathe slowly and easily. It can be helpful to keep track of a log of your progress. Your caregiver can provide you with a simple table to help with this. If you are using the spirometer at home, follow these instructions: [place] IF:  You are having difficultly using the spirometer. You have trouble using the spirometer as often as instructed. Your pain medication is not giving enough relief while using the spirometer. You develop fever of 100.5 F (38.1 C) or higher. SEEK IMMEDIATE MEDICAL CARE IF:  You cough up bloody sputum that had not been present before. You develop fever of 102 F (38.9 C) or greater. You develop worsening pain at or near the incision site. MAKE SURE YOU:  Understand these instructions. Will watch your condition. Will get help right away if you are not doing well or get worse. Document Released: 02/20/2007 Document Revised: 01/02/2012 Document Reviewed: 04/23/2007 [facility] Patient Information 2014 [place], [place].   ________________________________________________________________________

## 2021-08-10 ENCOUNTER — Encounter (HOSPITAL_COMMUNITY): Payer: Self-pay

## 2021-08-10 ENCOUNTER — Encounter (HOSPITAL_COMMUNITY)
Admission: RE | Admit: 2021-08-10 | Discharge: 2021-08-10 | Disposition: A | Discharge status: Home / Self Care | Payer: PPO | Source: Ambulatory Visit | Attending: Orthopedic Surgery | Admitting: Orthopedic Surgery

## 2021-08-10 ENCOUNTER — Other Ambulatory Visit: Payer: Self-pay

## 2021-08-10 DIAGNOSIS — Z01812 Encounter for preprocedural laboratory examination: Secondary | ICD-10-CM | POA: Diagnosis not present

## 2021-08-10 HISTORY — DX: Cardiac arrhythmia, unspecified: I49.9

## 2021-08-10 HISTORY — DX: Essential (primary) hypertension: I10

## 2021-08-10 LAB — SURGICAL PCR SCREEN
MRSA, PCR: NEGATIVE
Staphylococcus aureus: NEGATIVE

## 2021-08-10 NOTE — Progress Notes (Signed)
Anesthesia Chart Review   Case: [id number] Date/Time: 08/13/21 1215   Procedure: REVERSE SHOULDER ARTHROPLASTY (Left: Shoulder) - with ISB   Anesthesia type: General   Pre-op diagnosis: left shoulder cuff arthropathy   Location: [facility] ROOM 06 / [facility]   Surgeons: Netta Cedars, MD       DISCUSSION:80 y.o. former smoker with h/o GERD, h/o atrial flutter s/p RFCA 03/2011, HTN, left shoulder oa scheduled for above procedure 08/13/2021 with Dr. Netta Cedars.   Pt seen by PCP 07/27/2021. Per OV note, "We will check requested labs today.  Patient has good functional status is overall low risk for neurovascular event.  Depending on results of lab he will be cleared for surgery."  Anticipate pt can proceed with planned procedure barring acute status change.   VS: BP (!) 162/74   Pulse 72   Temp 36.6 C (Oral)   Ht 6' (1.829 m)   Wt 118.4 kg   SpO2 99%   BMI 35.40 kg/m   PROVIDERS: Vivi Barrack, MD is PCP    LABS: Labs reviewed: Acceptable for surgery. (all labs ordered are listed, but only abnormal results are displayed)  Labs Reviewed  SURGICAL PCR SCREEN     IMAGES:   EKG: 07/27/2021 Rate 70 bpm  Sinus  Rhythm  -First degree A-V block  PRi = 228 -Nonspecific QRS widening and anterior fascicular block.   -Old anterior infarct.  CV: Echo 02/17/2011 Study Conclusions   - Left ventricle: Wall thickness was increased in a pattern of mild    LVH. Systolic function was normal. The estimated ejection fraction    was in the range of 55% to 60%.  - Aortic valve: Mild regurgitation.  - Left atrium: The atrium was mildly to moderately dilated.  - Right atrium: The atrium was mildly dilated.  - Pulmonary arteries: PA peak pressure: 1mm Hg (S).  Transthoracic echocardiography. M-mode, complete 2D, spectral  Doppler, and color Doppler. Height: Height: 182.9cm. Height: 72in.  Weight: Weight: 112.9kg. Weight: 248.5lb. Body mass index: BMI:  33.8kg/m^2. Body surface area: BSA:  2.20m^2. Blood pressure: 152/94.  Patient status: Outpatient. Location: [facility]  Past Medical History:  Diagnosis Date   Atrial flutter ([place])    S/P RFCA with Dr. Caryl Comes 6/12   Bursitis of shoulder    Dysrhythmia    GERD (gastroesophageal reflux disease)    Hearing loss, neural    Left ear   Hemorrhoids    History of Guillain-Barre syndrome 2003   Hyperlipidemia    Hypertension    Injury of tendon of long head of biceps    Left axis deviation 1999   Osteoarthritis    Peptic ulcer 1964   with bleed ; transfused   Prediabetes 07/02/2008   Skin cancer     Past Surgical History:  Procedure Laterality Date   ABLATION OF DYSRHYTHMIC FOCUS  2012    for AF;Dr Caryl Comes   colonoscopy with polypectomy  2011   Internal hemorrhoids; [facility]   INGUINAL HERNIA REPAIR  07/16/2020   Procedure: DIAGNOSTIC LAPAROSCOPY, LAPAROSCOPIC ABDOMINAL WALL HERNIA REPAIR WITH MESH;  Surgeon: Alphonsa Overall, MD;  Location: [facility];  Service: General;;   INTRAOCULAR LENS INSERTION     MELANOMA EXCISION     on scalp January 2019   REPLACEMENT TOTAL KNEE  2000   Left Knee   TOTAL KNEE ARTHROPLASTY  09/21/2011   Procedure: TOTAL KNEE ARTHROPLASTY;  Surgeon: Tobi Bastos;  Location: [facility];  Service: Orthopedics;  Laterality: Right;   VASECTOMY     Scar tissue removed 2 years after the vasectomy    MEDICATIONS:  aspirin 325 MG EC tablet   esomeprazole (NEXIUM) 40 MG capsule   ibuprofen (ADVIL,MOTRIN) 200 MG tablet   lisinopril (ZESTRIL) 10 MG tablet   Melatonin 10 MG CAPS   Multiple Vitamin (MULTIVITAMIN) tablet   Omega-3 Fatty Acids (FISH OIL) 1200 MG CAPS   pravastatin (PRAVACHOL) 40 MG tablet   No current facility-administered medications for this encounter.    Konrad Felix Ward, PA-C [facility] Pre-Surgical Testing [phone]

## 2021-08-10 NOTE — Progress Notes (Signed)
COVID Vaccine Completed:Yes Date COVID Vaccine completed: 08/2020. X 3 COVID vaccine manufacturer: Pfizer     COVID Test: 08/11/21  PCP - Dr. Dimas Chyle. LOV: 07/27/21 Cardiologist - NO  Chest x-ray -  EKG - 07/27/21 Stress Test -  ECHO - 02/17/11 Cardiac Cath -  Pacemaker/ICD device last checked: A1C: 6.6: 07/27/21 Sleep Study -  CPAP -   Fasting Blood Sugar -  Checks Blood Sugar _____ times a day  Blood Thinner Instructions: Aspirin 325 mg has been held since 08/09/21 Aspirin Instructions: Last Dose:  Anesthesia review: Hx: HTN,pre-DIA,Aflutter  Patient denies shortness of breath, fever, cough and chest pain at PAT appointment   Patient verbalized understanding of instructions that were given to them at the PAT appointment. Patient was also instructed that they will need to review over the PAT instructions again at home before surgery.

## 2021-08-11 ENCOUNTER — Other Ambulatory Visit: Payer: Self-pay | Admitting: Orthopedic Surgery

## 2021-08-11 LAB — SARS CORONAVIRUS 2 (TAT 6-24 HRS): SARS Coronavirus 2: NEGATIVE

## 2021-08-13 ENCOUNTER — Encounter (HOSPITAL_COMMUNITY): Payer: Self-pay | Admitting: Orthopedic Surgery

## 2021-08-13 ENCOUNTER — Ambulatory Visit (HOSPITAL_COMMUNITY): Payer: PPO | Admitting: Physician Assistant

## 2021-08-13 ENCOUNTER — Other Ambulatory Visit: Payer: Self-pay

## 2021-08-13 ENCOUNTER — Observation Stay (HOSPITAL_COMMUNITY): Payer: PPO

## 2021-08-13 ENCOUNTER — Encounter (HOSPITAL_COMMUNITY)
Admission: RE | Disposition: A | Discharge status: Home / Self Care | Payer: Self-pay | Source: Home / Self Care | Attending: Orthopedic Surgery

## 2021-08-13 ENCOUNTER — Ambulatory Visit (HOSPITAL_COMMUNITY): Payer: PPO | Admitting: Anesthesiology

## 2021-08-13 ENCOUNTER — Observation Stay (HOSPITAL_COMMUNITY)
Admission: RE | Admit: 2021-08-13 | Discharge: 2021-08-14 | Disposition: A | Discharge status: Home / Self Care | Payer: PPO | Attending: Orthopedic Surgery | Admitting: Orthopedic Surgery

## 2021-08-13 DIAGNOSIS — M19012 Primary osteoarthritis, left shoulder: Secondary | ICD-10-CM | POA: Diagnosis not present

## 2021-08-13 DIAGNOSIS — Z471 Aftercare following joint replacement surgery: Secondary | ICD-10-CM | POA: Diagnosis not present

## 2021-08-13 DIAGNOSIS — G8918 Other acute postprocedural pain: Secondary | ICD-10-CM | POA: Diagnosis not present

## 2021-08-13 DIAGNOSIS — Z87891 Personal history of nicotine dependence: Secondary | ICD-10-CM | POA: Diagnosis not present

## 2021-08-13 DIAGNOSIS — Z96612 Presence of left artificial shoulder joint: Secondary | ICD-10-CM

## 2021-08-13 DIAGNOSIS — Z85828 Personal history of other malignant neoplasm of skin: Secondary | ICD-10-CM | POA: Diagnosis not present

## 2021-08-13 DIAGNOSIS — Z96653 Presence of artificial knee joint, bilateral: Secondary | ICD-10-CM | POA: Diagnosis not present

## 2021-08-13 DIAGNOSIS — I10 Essential (primary) hypertension: Secondary | ICD-10-CM | POA: Insufficient documentation

## 2021-08-13 DIAGNOSIS — K219 Gastro-esophageal reflux disease without esophagitis: Secondary | ICD-10-CM | POA: Diagnosis not present

## 2021-08-13 DIAGNOSIS — M12812 Other specific arthropathies, not elsewhere classified, left shoulder: Secondary | ICD-10-CM | POA: Diagnosis not present

## 2021-08-13 DIAGNOSIS — M75102 Unspecified rotator cuff tear or rupture of left shoulder, not specified as traumatic: Secondary | ICD-10-CM | POA: Diagnosis not present

## 2021-08-13 DIAGNOSIS — R7303 Prediabetes: Secondary | ICD-10-CM | POA: Insufficient documentation

## 2021-08-13 DIAGNOSIS — M75122 Complete rotator cuff tear or rupture of left shoulder, not specified as traumatic: Secondary | ICD-10-CM | POA: Diagnosis not present

## 2021-08-13 DIAGNOSIS — E785 Hyperlipidemia, unspecified: Secondary | ICD-10-CM | POA: Diagnosis not present

## 2021-08-13 HISTORY — PX: REVERSE SHOULDER ARTHROPLASTY: SHX5054

## 2021-08-13 LAB — GLUCOSE, CAPILLARY: Glucose-Capillary: 134 mg/dL — ABNORMAL HIGH (ref 70–99)

## 2021-08-13 SURGERY — ARTHROPLASTY, SHOULDER, TOTAL, REVERSE
Anesthesia: General | Site: Shoulder | Laterality: Left

## 2021-08-13 MED ORDER — METHOCARBAMOL 500 MG PO TABS: 500.0000 mg | ORAL_TABLET | Freq: Three times a day (TID) | ORAL | 1 refills | Status: DC | PRN

## 2021-08-13 MED ORDER — MELATONIN 5 MG PO TABS: 10.0000 mg | ORAL_TABLET | Freq: Every evening | ORAL | Status: DC | PRN

## 2021-08-13 MED ORDER — IBUPROFEN 200 MG PO TABS: 200.0000 mg | ORAL_TABLET | Freq: Three times a day (TID) | ORAL | Status: DC | PRN

## 2021-08-13 MED ORDER — ONDANSETRON HCL 4 MG/2ML IJ SOLN
INTRAMUSCULAR | Status: DC | PRN
  Administered 2021-08-13: 4 mg via INTRAVENOUS

## 2021-08-13 MED ORDER — CEFAZOLIN SODIUM-DEXTROSE 2-4 GM/100ML-% IV SOLN
2.0000 g | Freq: Four times a day (QID) | INTRAVENOUS | Status: AC
Start: 2021-08-13 — End: 2021-08-14
  Administered 2021-08-13 – 2021-08-14 (×3): 2 g via INTRAVENOUS
  Filled 2021-08-13 (×3): qty 100

## 2021-08-13 MED ORDER — ONDANSETRON HCL 4 MG PO TABS: 4.0000 mg | ORAL_TABLET | Freq: Four times a day (QID) | ORAL | Status: DC | PRN

## 2021-08-13 MED ORDER — LACTATED RINGERS IV SOLN: INTRAVENOUS | Status: DC

## 2021-08-13 MED ORDER — MIDAZOLAM HCL 2 MG/2ML IJ SOLN
INTRAMUSCULAR | Status: AC
  Filled 2021-08-13: qty 2

## 2021-08-13 MED ORDER — ORAL CARE MOUTH RINSE: 15.0000 mL | Freq: Once | OROMUCOSAL | Status: AC

## 2021-08-13 MED ORDER — ONDANSETRON HCL 4 MG/2ML IJ SOLN: 4.0000 mg | Freq: Four times a day (QID) | INTRAMUSCULAR | Status: DC | PRN

## 2021-08-13 MED ORDER — SODIUM CHLORIDE 0.9 % IR SOLN
Status: DC | PRN
  Administered 2021-08-13: 1000 mL

## 2021-08-13 MED ORDER — ASPIRIN 81 MG PO CHEW: 81.0000 mg | CHEWABLE_TABLET | Freq: Two times a day (BID) | ORAL | 0 refills | Status: DC

## 2021-08-13 MED ORDER — SODIUM CHLORIDE 0.9 % IV SOLN: INTRAVENOUS | Status: DC

## 2021-08-13 MED ORDER — PHENYLEPHRINE HCL-NACL 20-0.9 MG/250ML-% IV SOLN
INTRAVENOUS | Status: DC | PRN
  Administered 2021-08-13: 25 ug/min via INTRAVENOUS

## 2021-08-13 MED ORDER — PROPOFOL 10 MG/ML IV BOLUS
INTRAVENOUS | Status: DC | PRN
  Administered 2021-08-13: 150 mg via INTRAVENOUS

## 2021-08-13 MED ORDER — METOCLOPRAMIDE HCL 5 MG/ML IJ SOLN: 5.0000 mg | Freq: Three times a day (TID) | INTRAMUSCULAR | Status: DC | PRN

## 2021-08-13 MED ORDER — DIPHENHYDRAMINE HCL 50 MG/ML IJ SOLN
INTRAMUSCULAR | Status: DC | PRN
  Administered 2021-08-13: 12.5 mg via INTRAVENOUS

## 2021-08-13 MED ORDER — PRAVASTATIN SODIUM 20 MG PO TABS
20.0000 mg | ORAL_TABLET | Freq: Every day | ORAL | Status: DC
  Administered 2021-08-13: 20 mg via ORAL
  Filled 2021-08-13 (×2): qty 1

## 2021-08-13 MED ORDER — DOCUSATE SODIUM 100 MG PO CAPS
100.0000 mg | ORAL_CAPSULE | Freq: Two times a day (BID) | ORAL | Status: DC
  Administered 2021-08-13: 100 mg via ORAL
  Filled 2021-08-13 (×2): qty 1

## 2021-08-13 MED ORDER — HYDROMORPHONE HCL 1 MG/ML IJ SOLN: 0.5000 mg | INTRAMUSCULAR | Status: DC | PRN

## 2021-08-13 MED ORDER — FENTANYL CITRATE (PF) 100 MCG/2ML IJ SOLN
INTRAMUSCULAR | Status: DC | PRN
  Administered 2021-08-13 (×2): 50 ug via INTRAVENOUS

## 2021-08-13 MED ORDER — ONDANSETRON HCL 4 MG PO TABS: 4.0000 mg | ORAL_TABLET | Freq: Three times a day (TID) | ORAL | 1 refills | Status: DC | PRN

## 2021-08-13 MED ORDER — DEXAMETHASONE SODIUM PHOSPHATE 10 MG/ML IJ SOLN
INTRAMUSCULAR | Status: DC | PRN
  Administered 2021-08-13: 10 mg via INTRAVENOUS

## 2021-08-13 MED ORDER — ADULT MULTIVITAMIN W/MINERALS CH
1.0000 | ORAL_TABLET | Freq: Every day | ORAL | Status: DC
  Administered 2021-08-14: 1 via ORAL
  Filled 2021-08-13: qty 1

## 2021-08-13 MED ORDER — LIDOCAINE 2% (20 MG/ML) 5 ML SYRINGE
INTRAMUSCULAR | Status: DC | PRN
  Administered 2021-08-13: 100 mg via INTRAVENOUS

## 2021-08-13 MED ORDER — FENTANYL CITRATE (PF) 100 MCG/2ML IJ SOLN
INTRAMUSCULAR | Status: AC
  Filled 2021-08-13: qty 2

## 2021-08-13 MED ORDER — PHENYLEPHRINE HCL (PRESSORS) 10 MG/ML IV SOLN
INTRAVENOUS | Status: AC
  Filled 2021-08-13: qty 1

## 2021-08-13 MED ORDER — OXYCODONE-ACETAMINOPHEN 5-325 MG PO TABS: 1.0000 | ORAL_TABLET | ORAL | 0 refills | Status: DC | PRN

## 2021-08-13 MED ORDER — ASPIRIN EC 325 MG PO TBEC
325.0000 mg | DELAYED_RELEASE_TABLET | ORAL | Status: DC
  Administered 2021-08-13 – 2021-08-14 (×2): 325 mg via ORAL
  Filled 2021-08-13 (×2): qty 1

## 2021-08-13 MED ORDER — OXYCODONE HCL 5 MG PO TABS: 5.0000 mg | ORAL_TABLET | ORAL | Status: DC | PRN

## 2021-08-13 MED ORDER — ACETAMINOPHEN 500 MG PO TABS: 1000.0000 mg | ORAL_TABLET | Freq: Once | ORAL | Status: AC

## 2021-08-13 MED ORDER — BUPIVACAINE-EPINEPHRINE (PF) 0.25% -1:200000 IJ SOLN
INTRAMUSCULAR | Status: DC | PRN
  Administered 2021-08-13: 12 mL via PERINEURAL

## 2021-08-13 MED ORDER — METHOCARBAMOL 500 MG IVPB - SIMPLE MED
500.0000 mg | Freq: Four times a day (QID) | INTRAVENOUS | Status: DC | PRN
  Filled 2021-08-13: qty 50

## 2021-08-13 MED ORDER — FENTANYL CITRATE PF 50 MCG/ML IJ SOSY
50.0000 ug | PREFILLED_SYRINGE | INTRAMUSCULAR | Status: AC
  Administered 2021-08-13: 50 ug via INTRAVENOUS
  Filled 2021-08-13: qty 2

## 2021-08-13 MED ORDER — BUPIVACAINE LIPOSOME 1.3 % IJ SUSP
INTRAMUSCULAR | Status: DC | PRN
  Administered 2021-08-13: 10 mL via PERINEURAL

## 2021-08-13 MED ORDER — CHLORHEXIDINE GLUCONATE 0.12 % MT SOLN
15.0000 mL | Freq: Once | OROMUCOSAL | Status: AC
  Administered 2021-08-13: 15 mL via OROMUCOSAL

## 2021-08-13 MED ORDER — BUPIVACAINE-EPINEPHRINE (PF) 0.25% -1:200000 IJ SOLN
INTRAMUSCULAR | Status: AC
  Filled 2021-08-13: qty 30

## 2021-08-13 MED ORDER — LISINOPRIL 10 MG PO TABS
10.0000 mg | ORAL_TABLET | Freq: Every day | ORAL | Status: DC
  Administered 2021-08-13: 10 mg via ORAL
  Filled 2021-08-13 (×2): qty 1

## 2021-08-13 MED ORDER — DIPHENHYDRAMINE HCL 50 MG/ML IJ SOLN
INTRAMUSCULAR | Status: AC
  Filled 2021-08-13: qty 1

## 2021-08-13 MED ORDER — TRANEXAMIC ACID-NACL 1000-0.7 MG/100ML-% IV SOLN
INTRAVENOUS | Status: DC | PRN
  Administered 2021-08-13: 1000 mg via INTRAVENOUS

## 2021-08-13 MED ORDER — ACETAMINOPHEN 325 MG PO TABS: 325.0000 mg | ORAL_TABLET | Freq: Four times a day (QID) | ORAL | Status: DC | PRN

## 2021-08-13 MED ORDER — METOCLOPRAMIDE HCL 5 MG PO TABS: 5.0000 mg | ORAL_TABLET | Freq: Three times a day (TID) | ORAL | Status: DC | PRN

## 2021-08-13 MED ORDER — MIDAZOLAM HCL 2 MG/2ML IJ SOLN
INTRAMUSCULAR | Status: DC | PRN
  Administered 2021-08-13: 1 mg via INTRAVENOUS

## 2021-08-13 MED ORDER — PANTOPRAZOLE SODIUM 40 MG PO TBEC
40.0000 mg | DELAYED_RELEASE_TABLET | Freq: Every day | ORAL | Status: DC
  Administered 2021-08-14: 40 mg via ORAL
  Filled 2021-08-13: qty 1

## 2021-08-13 MED ORDER — BUPIVACAINE HCL (PF) 0.5 % IJ SOLN
INTRAMUSCULAR | Status: DC | PRN
  Administered 2021-08-13: 10 mL via PERINEURAL

## 2021-08-13 MED ORDER — METHOCARBAMOL 500 MG PO TABS
500.0000 mg | ORAL_TABLET | Freq: Four times a day (QID) | ORAL | Status: DC | PRN
  Administered 2021-08-13 – 2021-08-14 (×2): 500 mg via ORAL
  Filled 2021-08-13 (×2): qty 1

## 2021-08-13 MED ORDER — MENTHOL 3 MG MT LOZG: 1.0000 | LOZENGE | OROMUCOSAL | Status: DC | PRN

## 2021-08-13 MED ORDER — OMEGA-3-ACID ETHYL ESTERS 1 G PO CAPS
1.0000 g | ORAL_CAPSULE | Freq: Every day | ORAL | Status: DC
  Filled 2021-08-13: qty 1

## 2021-08-13 MED ORDER — TRANEXAMIC ACID-NACL 1000-0.7 MG/100ML-% IV SOLN
1000.0000 mg | Freq: Once | INTRAVENOUS | Status: AC
  Administered 2021-08-13: 1000 mg via INTRAVENOUS
  Filled 2021-08-13: qty 100

## 2021-08-13 MED ORDER — PHENOL 1.4 % MT LIQD: 1.0000 | OROMUCOSAL | Status: DC | PRN

## 2021-08-13 MED ORDER — SUCCINYLCHOLINE CHLORIDE 200 MG/10ML IV SOSY
PREFILLED_SYRINGE | INTRAVENOUS | Status: DC | PRN
Start: 2021-08-13 — End: 2021-08-13
  Administered 2021-08-13: 100 mg via INTRAVENOUS

## 2021-08-13 MED ORDER — TRANEXAMIC ACID-NACL 1000-0.7 MG/100ML-% IV SOLN
INTRAVENOUS | Status: AC
  Filled 2021-08-13: qty 100

## 2021-08-13 MED ORDER — ACETAMINOPHEN 500 MG PO TABS
ORAL_TABLET | ORAL | Status: AC
  Administered 2021-08-13: 1000 mg via ORAL
  Filled 2021-08-13: qty 2

## 2021-08-13 MED ORDER — CEFAZOLIN SODIUM-DEXTROSE 2-4 GM/100ML-% IV SOLN
2.0000 g | INTRAVENOUS | Status: AC
  Administered 2021-08-13: 2 g via INTRAVENOUS
  Filled 2021-08-13: qty 100

## 2021-08-13 SURGICAL SUPPLY — 70 items
AID PSTN UNV HD RSTRNT DISP (MISCELLANEOUS) ×1
BAG COUNTER SPONGE SURGICOUNT (BAG) ×1 IMPLANT
BAG SPEC THK2 15X12 ZIP CLS (MISCELLANEOUS)
BAG SPNG CNTER NS LX DISP (BAG) ×1
BAG ZIPLOCK 12X15 (MISCELLANEOUS) IMPLANT
BIT DRILL 1.6MX128 (BIT) ×1 IMPLANT
BLADE SAG 18X100X1.27 (BLADE) ×2 IMPLANT
BSPLAT GLND +2X24 MDLR (Joint) ×1 IMPLANT
CLSR STERI-STRIP ANTIMIC 1/2X4 (GAUZE/BANDAGES/DRESSINGS) ×1 IMPLANT
COVER BACK TABLE 60X90IN (DRAPES) ×2 IMPLANT
COVER SURGICAL LIGHT HANDLE (MISCELLANEOUS) ×2 IMPLANT
CUP SUT UNIV REVERS 39+2 LT (Shoulder) ×1 IMPLANT
DECANTER SPIKE VIAL GLASS SM (MISCELLANEOUS) ×2 IMPLANT
DRAPE INCISE IOBAN 66X45 STRL (DRAPES) ×2 IMPLANT
DRAPE ORTHO SPLIT 77X108 STRL (DRAPES) ×4
DRAPE SHEET LG 3/4 BI-LAMINATE (DRAPES) ×2 IMPLANT
DRAPE SURG ORHT 6 SPLT 77X108 (DRAPES) ×2 IMPLANT
DRAPE TOP 10253 STERILE (DRAPES) ×2 IMPLANT
DRAPE U-SHAPE 47X51 STRL (DRAPES) ×2 IMPLANT
DRSG ADAPTIC 3X8 NADH LF (GAUZE/BANDAGES/DRESSINGS) ×2 IMPLANT
DRSG EMULSION OIL 3X16 NADH (GAUZE/BANDAGES/DRESSINGS) ×1 IMPLANT
DRSG PAD ABDOMINAL 8X10 ST (GAUZE/BANDAGES/DRESSINGS) ×2 IMPLANT
DURAPREP 26ML APPLICATOR (WOUND CARE) ×2 IMPLANT
ELECT BLADE TIP CTD 4 INCH (ELECTRODE) ×2 IMPLANT
ELECT NDL TIP 2.8 STRL (NEEDLE) ×1 IMPLANT
ELECT NEEDLE TIP 2.8 STRL (NEEDLE) ×2 IMPLANT
ELECT REM PT RETURN 15FT ADLT (MISCELLANEOUS) ×2 IMPLANT
FACESHIELD WRAPAROUND (MASK) ×2 IMPLANT
FACESHIELD WRAPAROUND OR TEAM (MASK) ×1 IMPLANT
GAUZE SPONGE 4X4 12PLY STRL (GAUZE/BANDAGES/DRESSINGS) ×2 IMPLANT
GLENOID UNI REV MOD 24 +2 LAT (Joint) ×1 IMPLANT
GLENOSPHERE 39+4 LAT/24 UNI RV (Joint) ×1 IMPLANT
GLOVE SURG ORTHO LTX SZ7.5 (GLOVE) ×2 IMPLANT
GLOVE SURG ORTHO LTX SZ8.5 (GLOVE) ×2 IMPLANT
GLOVE SURG UNDER POLY LF SZ7.5 (GLOVE) ×2 IMPLANT
GLOVE SURG UNDER POLY LF SZ8.5 (GLOVE) ×2 IMPLANT
GOWN STRL REUS W/TWL XL LVL3 (GOWN DISPOSABLE) ×4 IMPLANT
INSERT HUMERAL 39/+6 (Insert) ×1 IMPLANT
KIT BASIN OR (CUSTOM PROCEDURE TRAY) ×2 IMPLANT
MANIFOLD NEPTUNE II (INSTRUMENTS) ×2 IMPLANT
NDL MAYO CATGUT SZ4 TPR NDL (NEEDLE) IMPLANT
NEEDLE MAYO CATGUT SZ4 (NEEDLE) IMPLANT
NS IRRIG 1000ML POUR BTL (IV SOLUTION) ×2 IMPLANT
PACK SHOULDER (CUSTOM PROCEDURE TRAY) ×2 IMPLANT
PIN SET MODULAR GLENOID SYSTEM (PIN) ×1 IMPLANT
PROTECTOR NERVE ULNAR (MISCELLANEOUS) ×2 IMPLANT
RESTRAINT HEAD UNIVERSAL NS (MISCELLANEOUS) ×2 IMPLANT
SCREW CENTRAL MOD 35 (Screw) ×1 IMPLANT
SCREW PERI LOCK 5.5X32 (Screw) ×1 IMPLANT
SCREW PERI LOCK 5.5X36 (Screw) ×1 IMPLANT
SCREW PERIPHERAL 5.5X20 LOCK (Screw) ×1 IMPLANT
SLEEVE SCD COMPRESS KNEE MED (STOCKING) ×1 IMPLANT
SLING ARM FOAM STRAP LRG (SOFTGOODS) ×1 IMPLANT
SLING ARM FOAM STRAP XLG (SOFTGOODS) ×1 IMPLANT
SMARTMIX MINI TOWER (MISCELLANEOUS)
SPONGE T-LAP 18X18 ~~LOC~~+RFID (SPONGE) ×1 IMPLANT
SPONGE T-LAP 4X18 ~~LOC~~+RFID (SPONGE) ×1 IMPLANT
STEM HUMERAL UNIVERS SZ8 (Stem) ×1 IMPLANT
STRIP CLOSURE SKIN 1/2X4 (GAUZE/BANDAGES/DRESSINGS) ×2 IMPLANT
SUCTION FRAZIER HANDLE 10FR (MISCELLANEOUS) ×2
SUCTION TUBE FRAZIER 10FR DISP (MISCELLANEOUS) ×1 IMPLANT
SUT FIBERWIRE #2 38 T-5 BLUE (SUTURE) ×4
SUT MNCRL AB 4-0 PS2 18 (SUTURE) ×2 IMPLANT
SUT VIC AB 0 CT1 36 (SUTURE) ×4 IMPLANT
SUT VIC AB 0 CT2 27 (SUTURE) ×2 IMPLANT
SUT VIC AB 2-0 CT1 27 (SUTURE) ×2
SUT VIC AB 2-0 CT1 TAPERPNT 27 (SUTURE) ×1 IMPLANT
SUTURE FIBERWR #2 38 T-5 BLUE (SUTURE) ×2 IMPLANT
TOWEL OR 17X26 10 PK STRL BLUE (TOWEL DISPOSABLE) ×2 IMPLANT
TOWER SMARTMIX MINI (MISCELLANEOUS) IMPLANT

## 2021-08-13 NOTE — H&P (Signed)
Patient's anticipated LOS is less than 2 midnights, meeting these requirements: - Younger than 59 - Lives within 1 hour of care - Has a competent adult at home to recover with post-op recover - NO history of  - Chronic pain requiring opiods  - Diabetes  - Coronary Artery Disease  - Heart failure  - Heart attack  - Stroke  - DVT/VTE  - Cardiac arrhythmia  - Respiratory Failure/COPD  - Renal failure  - Anemia  - Advanced Liver disease     Troy Garrison is an 80 y.o. male.    Chief Complaint: left shoulder pain  HPI: Pt is a 81 y.o. male complaining of left shoulder  pain for multiple years. Pain had continually increased since the beginning. X-rays in the clinic show end-stage arthritic changes of the left shoulder. Pt has tried various conservative treatments which have failed to alleviate their symptoms, including injections and therapy. Various options are discussed with the patient. Risks, benefits and expectations were discussed with the patient. Patient understand the risks, benefits and expectations and wishes to proceed with surgery.   PCP:  Vivi Barrack, MD  D/C Plans: Home  PMH: Past Medical History:  Diagnosis Date   Atrial flutter ([place])    S/P RFCA with Dr. Caryl Comes 6/12   Bursitis of shoulder    Dysrhythmia    GERD (gastroesophageal reflux disease)    Hearing loss, neural    Left ear   Hemorrhoids    History of Guillain-Barre syndrome 2003   Hyperlipidemia    Hypertension    Injury of tendon of long head of biceps    Left axis deviation 1999   Osteoarthritis    Peptic ulcer 1964   with bleed ; transfused   Prediabetes 07/02/2008   Skin cancer     PSH: Past Surgical History:  Procedure Laterality Date   ABLATION OF DYSRHYTHMIC FOCUS  2012    for AF;Dr Caryl Comes   colonoscopy with polypectomy  2011   Internal hemorrhoids; [facility]   INGUINAL HERNIA REPAIR  07/16/2020   Procedure: DIAGNOSTIC LAPAROSCOPY, LAPAROSCOPIC ABDOMINAL WALL HERNIA REPAIR  WITH MESH;  Surgeon: Alphonsa Overall, MD;  Location: [facility];  Service: General;;   INTRAOCULAR LENS INSERTION     MELANOMA EXCISION     on scalp January 2019   REPLACEMENT TOTAL KNEE  2000   Left Knee   TOTAL KNEE ARTHROPLASTY  09/21/2011   Procedure: TOTAL KNEE ARTHROPLASTY;  Surgeon: Tobi Bastos;  Location: [facility];  Service: Orthopedics;  Laterality: Right;   VASECTOMY     Scar tissue removed 2 years after the vasectomy    Social History:  reports that he quit smoking about 28 years ago. His smoking use included cigarettes. He has never used smokeless tobacco. He reports current alcohol use. He reports that he does not use drugs.  Allergies:  No Known Allergies  Medications: No current facility-administered medications for this encounter.    No results found for this or any previous visit (from the past 48 hour(s)). No results found.  ROS: Pain with rom of the left upper extremity  Physical Exam: Alert and oriented 80 y.o.  male in no acute distress Cranial nerves 2-12 intact Cervical spine: full rom with no tenderness, nv intact distally Chest: active breath sounds bilaterally, no wheeze rhonchi or rales Heart: regular rate and rhythm, no murmur Abd: non tender non distended with active bowel sounds Hip is stable with rom  Left shoulder painful and  weak rom Nv intact distally No rashes or edema distally  Assessment/Plan Assessment: left shoulder cuff arthropathy  Plan:  Patient will undergo a left reverse shoulder by Dr. Veverly Fells at [facility] Risks benefits and expectations were discussed with the patient. Patient understand risks, benefits and expectations and wishes to proceed. Preoperative templating of the joint replacement has been completed, documented, and submitted to the Operating Room personnel in order to optimize intra-operative equipment management.   Merla Riches PA-C, MPAS [facility] is now [employer] [address]  [place], [place], [place] [address] Phone: [phone] [email or web address] Facebook  [employer]

## 2021-08-13 NOTE — Interval H&P Note (Signed)
History and Physical Interval Note:  08/13/2021 12:17 PM  Troy Garrison  has presented today for surgery, with the diagnosis of left shoulder cuff arthropathy.  The various methods of treatment have been discussed with the patient and family. After consideration of risks, benefits and other options for treatment, the patient has consented to  Procedure(s) with comments: REVERSE SHOULDER ARTHROPLASTY (Left) - with ISB as a surgical intervention.  The patient's history has been reviewed, patient examined, no change in status, stable for surgery.  I have reviewed the patient's chart and labs.  Questions were answered to the patient's satisfaction.     [name]

## 2021-08-13 NOTE — Anesthesia Procedure Notes (Addendum)
Arterial Line Insertion Start/End10/21/2022 1:35 PM, 08/13/2021 1:45 PM Performed by: Nolon Nations, MD, anesthesiologist  Patient location: Pre-op. Preanesthetic checklist: patient identified, IV checked, site marked, risks and benefits discussed, surgical consent, monitors and equipment checked, pre-op evaluation, timeout performed and anesthesia consent Lidocaine 1% used for infiltration Right, radial was placed Catheter size: 20 G Hand hygiene performed  and maximum sterile barriers used   Attempts: 1 Procedure performed without using ultrasound guided technique. Following insertion, dressing applied and Biopatch. Post procedure assessment: normal and unchanged  Patient tolerated the procedure well with no immediate complications.

## 2021-08-13 NOTE — Brief Op Note (Signed)
08/13/2021  3:26 PM  PATIENT:  Troy Garrison  80 y.o. male  PRE-OPERATIVE DIAGNOSIS:  left shoulder cuff arthropathy  POST-OPERATIVE DIAGNOSIS:  left shoulder cuff arthropathy  PROCEDURE:  Procedure(s) with comments: REVERSE SHOULDER ARTHROPLASTY (Left) - with ISB Arthrex Reverse TSA with NO subscap repair  SURGEON:  Surgeon(s) and Role:    Netta Cedars, MD - Primary  PHYSICIAN ASSISTANT:   ASSISTANTS: Ventura Bruns, PA-C   ANESTHESIA:   regional and general  EBL:  250 mL   BLOOD ADMINISTERED:none  DRAINS: none   LOCAL MEDICATIONS USED:  MARCAINE     SPECIMEN:  No Specimen  DISPOSITION OF SPECIMEN:  N/A  COUNTS:  YES  TOURNIQUET:  * No tourniquets in log *  DICTATION: .Other Dictation: Dictation Number [id number]  PLAN OF CARE: Admit for overnight observation  PATIENT DISPOSITION:  PACU - hemodynamically stable.   Delay start of Pharmacological VTE agent (>24hrs) due to surgical blood loss or risk of bleeding: not applicable

## 2021-08-13 NOTE — Transfer of Care (Signed)
Immediate Anesthesia Transfer of Care Note  Patient: Troy Garrison  Procedure(s) Performed: REVERSE SHOULDER ARTHROPLASTY (Left: Shoulder)  Patient Location: PACU  Anesthesia Type:General  Level of Consciousness: sedated  Airway & Oxygen Therapy: Patient Spontanous Breathing and Patient connected to face mask oxygen  Post-op Assessment: Report given to RN and Post -op Vital signs reviewed and stable  Post vital signs: Reviewed and stable  Last Vitals:  Vitals Value Taken Time  BP 152/81 08/13/21 1538  Temp    Pulse 80 08/13/21 1539  Resp 18 08/13/21 1539  SpO2 100 % 08/13/21 1539  Vitals shown include unvalidated device data.  Last Pain:  Vitals:   08/13/21 1036  TempSrc: Oral  PainSc: 0-No pain      Patients Stated Pain Goal: 4 (70/48/88 9169)  Complications: No notable events documented.

## 2021-08-13 NOTE — Anesthesia Procedure Notes (Addendum)
Anesthesia Regional Block: Interscalene brachial plexus block   Pre-Anesthetic Checklist: , timeout performed,  Correct Patient, Correct Site, Correct Laterality,  Correct Procedure, Correct Position, site marked,  Risks and benefits discussed,  Surgical consent,  Pre-op evaluation,  At surgeon's request and post-op pain management  Laterality: Upper and Left  Prep: chloraprep       Needles:  Injection technique: Single-shot  Needle Type: Stimulator Needle - 40     Needle Length: 4cm  Needle Gauge: 22     Additional Needles:   Procedures:,,,, ultrasound used (permanent image in chart),,    Narrative:  Start time: 08/13/2021 11:35 AM End time: 08/13/2021 11:55 AM Injection made incrementally with aspirations every 5 mL.  Performed by: Personally  Anesthesiologist: Nolon Nations, MD  Additional Notes: BP cuff, SpO2 and EKG monitors applied. Sedation begun. Nerve location verified with ultrasound. Anesthetic injected incrementally, slowly, and after neg aspirations under direct u/s guidance. Good perineural spread. Tolerated well.

## 2021-08-13 NOTE — Anesthesia Postprocedure Evaluation (Signed)
Anesthesia Post Note  Patient: Troy Garrison  Procedure(s) Performed: REVERSE SHOULDER ARTHROPLASTY (Left: Shoulder)     Patient location during evaluation: PACU Anesthesia Type: General Level of consciousness: sedated and patient cooperative Pain management: pain level controlled Vital Signs Assessment: post-procedure vital signs reviewed and stable Respiratory status: spontaneous breathing Cardiovascular status: stable Anesthetic complications: no   No notable events documented.  Last Vitals:  Vitals:   08/13/21 1726 08/13/21 1941  BP: (!) 144/83 (!) 163/95  Pulse: 84 97  Resp: 14 17  Temp: (!) 36.2 C 36.5 C  SpO2: 99% 100%    Last Pain:  Vitals:   08/13/21 1800  TempSrc:   PainSc: 0-No pain                 Nolon Nations

## 2021-08-13 NOTE — Discharge Instructions (Signed)
Ice to the shoulder constantly.  Keep the incision covered and clean and dry for one week, then ok to get it wet in the shower. ° °Do exercise as instructed several times per day. ° °DO NOT reach behind your back or push up out of a chair with the operative arm. ° °Use a sling while you are up and around for comfort, may remove while seated.  Keep pillow propped behind the operative elbow. ° °Follow up with Dr Ajeenah Heiny in two weeks in the office, call [phone] for appt °

## 2021-08-13 NOTE — Progress Notes (Signed)
Assisted Dr. Germeroth with left, ultrasound guided, interscalene  block. Side rails up, monitors on throughout procedure. See vital signs in flow sheet. Tolerated Procedure well. 

## 2021-08-13 NOTE — Anesthesia Procedure Notes (Signed)
Procedure Name: Intubation Date/Time: 08/13/2021 1:37 PM Performed by: Cynda Familia, CRNA Pre-anesthesia Checklist: Patient identified, Emergency Drugs available, Suction available and Patient being monitored Patient Re-evaluated:Patient Re-evaluated prior to induction Oxygen Delivery Method: Circle System Utilized Preoxygenation: Pre-oxygenation with 100% oxygen Induction Type: IV induction, Cricoid Pressure applied and Rapid sequence Ventilation: Mask ventilation without difficulty Laryngoscope Size: Miller and 2 Grade View: Grade IV Tube type: Oral Number of attempts: 1 Airway Equipment and Method: Stylet Placement Confirmation: ETT inserted through vocal cords under direct vision, positive ETCO2 and breath sounds checked- equal and bilateral Secured at: 23 cm Tube secured with: Tape Dental Injury: Teeth and Oropharynx as per pre-operative assessment  Difficulty Due To: Difficulty was anticipated, Difficult Airway- due to reduced neck mobility, Difficult Airway- due to anterior larynx and Difficult Airway- due to dentition Future Recommendations: Recommend- induction with short-acting agent, and alternative techniques readily available Comments: CONSIDER DIFFICULT INTUBATION AND GLIDESCOPE USE -IV INDUCTION GERMEROTH-INTUBATION AM CRNA ATRAUMATIC- TEETH AND MOUTH AS PREOP

## 2021-08-13 NOTE — Anesthesia Preprocedure Evaluation (Addendum)
Anesthesia Evaluation  Patient identified by MRN, date of birth, ID band Patient awake    Reviewed: Allergy & Precautions, NPO status , Patient's Chart, lab work & pertinent test results  History of Anesthesia Complications Negative for: history of anesthetic complications  Airway Mallampati: II  TM Distance: >3 FB Neck ROM: Full    Dental no notable dental hx. (+) Dental Advisory Given, Teeth Intact   Pulmonary neg pulmonary ROS, former smoker,    Pulmonary exam normal breath sounds clear to auscultation       Cardiovascular hypertension, Pt. on home beta blockers + dysrhythmias Atrial Fibrillation  Rhythm:Regular Rate:Normal  S/P ablation for atrial flutter   Neuro/Psych    GI/Hepatic Neg liver ROS, PUD, GERD  ,  Endo/Other  negative endocrine ROS  Renal/GU negative Renal ROS     Musculoskeletal  (+) Arthritis ,   Abdominal (+) + obese,   Peds  Hematology negative hematology ROS (+)   Anesthesia Other Findings   Reproductive/Obstetrics                           Anesthesia Physical  Anesthesia Plan  ASA: 3  Anesthesia Plan: General   Post-op Pain Management:    Induction: Intravenous, Rapid sequence and Cricoid pressure planned  PONV Risk Score and Plan: 4 or greater and Ondansetron, Dexamethasone, Diphenhydramine and Treatment may vary due to age or medical condition  Airway Management Planned: Oral ETT  Additional Equipment: None  Intra-op Plan:   Post-operative Plan: Extubation in OR  Informed Consent: I have reviewed the patients History and Physical, chart, labs and discussed the procedure including the risks, benefits and alternatives for the proposed anesthesia with the patient or authorized representative who has indicated his/her understanding and acceptance.     Dental advisory given  Plan Discussed with: CRNA  Anesthesia Plan Comments: (Prior to patient  rolling back to OR, CRNA called stating that patient was diaphoretic and complaining of "hot flash". I evaluated the patient in preop. He had one SBP in 90s. When I arrived, BP 128/60 and Pt stated he felt normal now. [place] and exerts himself without chest pain, SOB or diaphoresis. EKG obtained which, compared to 07/27/2021 and on from 2016, looked slightly different. I discussed with Dr. Harrell Gave with cardiology. She stated that there was no obvious signs of MI. I discussed with Dr. Veverly Fells and the patient and the patient was adamant he felt fine and has no concerns about his heart as [place]. We will proceed and plan post induction arterial line and over night telemetry/. )   Anesthesia Quick Evaluation

## 2021-08-14 DIAGNOSIS — M19012 Primary osteoarthritis, left shoulder: Secondary | ICD-10-CM | POA: Diagnosis not present

## 2021-08-14 LAB — BASIC METABOLIC PANEL
Anion gap: 8 (ref 5–15)
BUN: 13 mg/dL (ref 8–23)
CO2: 22 mmol/L (ref 22–32)
Calcium: 8.8 mg/dL — ABNORMAL LOW (ref 8.9–10.3)
Chloride: 101 mmol/L (ref 98–111)
Creatinine, Ser: 0.69 mg/dL (ref 0.61–1.24)
GFR, Estimated: >60 mL/min (ref >60)
Glucose, Bld: 160 mg/dL — ABNORMAL HIGH (ref 70–99)
Potassium: 4.6 mmol/L (ref 3.5–5.1)
Sodium: 131 mmol/L — ABNORMAL LOW (ref 135–145)

## 2021-08-14 LAB — HEMOGLOBIN AND HEMATOCRIT, BLOOD
HCT: 44.6 % (ref 39.0–52.0)
Hemoglobin: 14.6 g/dL (ref 13.0–17.0)

## 2021-08-14 NOTE — Progress Notes (Addendum)
   Subjective: 1 Day Post-Op Procedure(s) (LRB): REVERSE SHOULDER ARTHROPLASTY (Left) Patient reports pain as mild.   Patient seen in rounds for Dr. Veverly Fells Patient is well, and has had no acute complaints or problems. No acute events overnight. Foley catheter removed.  We will start therapy today.   Objective: Vital signs in last 24 hours: Temp:  [97.2 F (36.2 C)-98.4 F (36.9 C)] 97.8 F (36.6 C) (10/22 0635) Pulse Rate:  [67-97] 74 (10/22 0635) Resp:  [14-20] 17 (10/22 0635) BP: (131-167)/(71-95) 131/72 (10/22 0635) SpO2:  [98 %-100 %] 100 % (10/22 0635) Arterial Line BP: (169-182)/(70) 175/70 (10/21 1615) Weight:  [118.4 kg] 118.4 kg (10/21 1036)  Intake/Output from previous day:  Intake/Output Summary (Last 24 hours) at 08/14/2021 0917 Last data filed at 08/14/2021 0741 Gross per 24 hour  Intake 3711.84 ml  Output 650 ml  Net 3061.84 ml     Intake/Output this shift: Total I/O In: 344.7 [P.O.:240; I.V.:12.2; IV Piggyback:92.5] Out: -   Labs: Recent Labs    08/14/21 0325  HGB 14.6   Recent Labs    08/14/21 0325  HCT 44.6   Recent Labs    08/14/21 0325  NA 131*  K 4.6  CL 101  CO2 22  BUN 13  CREATININE 0.69  GLUCOSE 160*  CALCIUM 8.8*   No results for input(s): LABPT, INR in the last 72 hours.  Exam: General - Patient is Alert and Oriented Extremity - Neurologically intact  Intact pulses distally  Dressing - dressing changed to aquacel Motor Function - block in affect. Some sensation about the shoulder  Past Medical History:  Diagnosis Date   Atrial flutter ([place])    S/P RFCA with Dr. Caryl Comes 6/12   Bursitis of shoulder    Dysrhythmia    GERD (gastroesophageal reflux disease)    Hearing loss, neural    Left ear   Hemorrhoids    History of Guillain-Barre syndrome 2003   Hyperlipidemia    Hypertension    Injury of tendon of long head of biceps    Left axis deviation 1999   Osteoarthritis    Peptic ulcer 1964   with bleed ;  transfused   Prediabetes 07/02/2008   Skin cancer     Assessment/Plan: 1 Day Post-Op Procedure(s) (LRB): REVERSE SHOULDER ARTHROPLASTY (Left) Active Problems:   H/O total shoulder replacement, left  Estimated body mass index is 35.4 kg/m as calculated from the following:   Height as of this encounter: 6' (1.829 m).   Weight as of this encounter: 118.4 kg. Advance diet Up with therapy D/C IV fluids  NWB LUE  Plan is to go Home after hospital stay. Plan for discharge today following 1-2 sessions of PT as long as they are meeting their goals.  Follow up in the office in 2 weeks.   Griffith Citron, PA-C Orthopedic Surgery [phone] 08/14/2021, 9:17 AM

## 2021-08-14 NOTE — Progress Notes (Signed)
Pt stable at time of discharge instructions and education. No needs at this time. Pt dressing, clean, dry and intact.

## 2021-08-14 NOTE — Op Note (Signed)
NAME: Troy Garrison, CARUTH MEDICAL RECORD NO: [id number] ACCOUNT NO: [id number] DATE OF BIRTH: 07-28-1941 FACILITY: [facility]ATION: [id number] PHYSICIAN: Doran Heater. Veverly Fells, MD  Operative Report   DATE OF PROCEDURE: 08/13/2021  PREOPERATIVE DIAGNOSIS:  Left shoulder rotator cuff tear arthropathy.  POSTOPERATIVE DIAGNOSIS:  Left shoulder rotator cuff tear arthropathy.  PROCEDURE PERFORMED:  Left reverse total shoulder replacement using Arthrex reverse shoulder system with no subscap repair.  ATTENDING SURGEON:  Doran Heater. Veverly Fells, MD.  ASSISTANT:  Charletta Cousin [place], [place], who was scrubbed during the entire procedure and necessary for satisfactory completion of surgery.  ANESTHESIA: General anesthesia was used plus interscalene block.  ESTIMATED BLOOD LOSS:  200 mL  FLUID REPLACEMENT:  1500 mL crystalloid.  INSTRUMENT COUNTS: Correct.    COMPLICATIONS:  No complications.    Perioperative antibiotics were given.  INDICATIONS:  The patient is a 80 year old male who presents with a history of worsening left shoulder pain and dysfunction secondary to rotator cuff tear arthropathy.  Having failed an extended period of conservative management, the patient presents for  operative treatment to restore function and eliminate pain.  Informed consent was obtained.  DESCRIPTION OF PROCEDURE:  After an adequate level of anesthesia was achieved, the patient was positioned in modified beach chair position.  Left shoulder correctly identified and sterilely prepped and draped in the usual manner.  Timeout called,  verifying correct patient, correct site.  We entered the patient's shoulder using a standard deltopectoral approach, starting at the coracoid process, extending down to the anterior humerus.  Dissection down through subcutaneous tissues using Bovie,  identified the cephalic vein and took down laterally.  The deltoid pectoralis was taken medially.  Conjoined tendon identified and retracted medially.   We were not able to visualize the biceps tendon as I feel that it previously ruptured.  We went ahead  and released the subscapularis remnant off the lesser tuberosity.  This was very thin and atrophic and not amenable for repair, but we did tag that tissue for protection of the axillary nerve. We released the inferior capsule progressively externally  rotating.  At this point, we delivered the humeral head out of the wound with progressive external rotation and extension.  Next, we used the starting reamer and reamed up to a size 7 and then placed our intramedullary resection guide set on 30 degrees  of retroversion and used the neck resection guide from the Arthrex system.  We then resected with the oscillating saw at the appropriate height just to the lateral edge of the greater tuberosity. With the 135 cut performed, we used a rongeur to remove  excess osteophytes.  We then subluxed the humerus posteriorly, gaining good exposure of the glenoid face.  We removed the glenoid labrum and capsule, protecting the axillary nerve.  We then found our center point for a guide pin angled 10 degrees  inferiorly.  We then reamed for the metaglene baseplate.  Once we did that, we sized our central screw to 35 mm in length.  We then assembled the screw, the large central screw, which was a 35 mm on the back table with a 24 baseplate and then screwed  that after we did our peripheral reaming and then drilled out our central hole for the central large screw.  We then tapped for the 35 central screw and then placed the construct, which was the 35 central screw attached to the baseplate and screwed that  all the way down, careful to stop at the appropriate 12  and 6 o'clock position.  We then placed the appropriately length superior and inferior screws, gaining excellent purchase and locking this into place and then an anterior screw, which was 20 mm in  length.  Once we had all the baseplate secured, we did not place a  posterior screw, but we then took the 39 glenosphere, impacted that onto the baseplate.  We then placed the central set screw and screwed that down.  I did a final finger sweep to make  sure we had no soft tissue caught up in that bearing.  We then went back to the humeral side and broached up to the size 7 broach.  Once we had good rotational control with that size 7, we went ahead and selected the 135 position on our trial and we had  a slightly posteriorly eccentric humeral tray.  We then impacted that position, reduced initially with a 39+3 poly.  We then went to a +6, but we were happy with that +6 in terms of our tensioning and soft tissue balance and mobility.  We then removed  all the trial components and selected the real press fit 7 stem with the 135 tray and secured that to the baseplate and then secured it to the stem and then impacted out as a single unit into the humerus.  Once that was secured and we were pleased with  our position, then we selected the real 39+6 poly and placed that onto the humeral tray and impacted that.  We then reduced the shoulder, had appropriate soft tissue tensioning with no gapping with inferior pole or external rotation and appropriate  conjoined tension, did a final finger check on the axillary nerve, which was in continuity and was under appropriate tension, not too much.  We irrigated thoroughly and then resected the remnant of the subscap, then repaired deltopectoral interval with 0  Vicryl suture followed by 2-0 Vicryl for subcutaneous closure and 4-0 Monocryl for skin.  Steri-Strips were applied followed by sterile dressing.  The patient tolerated the surgery well.   PAA D: 08/13/2021 3:34:22 pm T: 08/14/2021 12:38:00 am  JOB: [id number]/ [id number]

## 2021-08-14 NOTE — Plan of Care (Signed)
  Problem: Activity: Goal: Ability to tolerate increased activity will improve Outcome: Progressing   Problem: Pain Management: Goal: Pain level will decrease with appropriate interventions Outcome: Progressing   

## 2021-08-14 NOTE — Plan of Care (Signed)
Pt to d/c home.

## 2021-08-14 NOTE — Plan of Care (Signed)
  Problem: Pain Management: Goal: Pain level will decrease with appropriate interventions Outcome: Progressing   

## 2021-08-14 NOTE — Evaluation (Signed)
Occupational Therapy Evaluation Patient Details Name: Troy Garrison MRN: [id number] DOB: Jul 27, 1941 Today's Date: 08/14/2021   History of Present Illness REVERSE SHOULDER ARTHROPLASTY (Left)   Clinical Impression   Pt admitted for shoulder sx per Dr Troy Garrison. OT education complete. Pt to DC home this day   Recommendations for follow up therapy are one component of a multi-disciplinary discharge planning process, led by the attending physician.  Recommendations may be updated based on patient status, additional functional criteria and insurance authorization.   Follow Up Recommendations  Follow surgeon's recommendation for DC plan and follow-up therapies    Equipment Recommendations  None recommended by OT    Recommendations for Other Services       Precautions / Restrictions Precautions Precautions: Shoulder Type of Shoulder Precautions: no shoulder ROM.  Elbow, wrst and hand ROM OK Shoulder Interventions: Shoulder sling/immobilizer Restrictions Weight Bearing Restrictions: Yes LUE Weight Bearing: Non weight bearing      Mobility Bed Mobility               General bed mobility comments: pt in chair    Transfers Overall transfer level: Needs assistance Equipment used: 1 person hand held assist Transfers: Sit to/from Stand;Stand Pivot Transfers Sit to Stand: Min guard Stand pivot transfers: Min guard            Balance Overall balance assessment: Mild deficits observed, not formally tested                                         ADL either performed or assessed with clinical judgement       Pertinent Vitals/Pain Pain Assessment: 0-10 Pain Score: 0-No pain           Communication Communication Communication: No difficulties   Cognition Arousal/Alertness: Awake/alert Behavior During Therapy: WFL for tasks assessed/performed Overall Cognitive Status: Within Functional Limits for tasks assessed                                            Shoulder Instructions Shoulder Instructions Donning/doffing shirt without moving shoulder: Minimal assistance;Patient able to independently direct caregiver Method for sponge bathing under operated UE: Minimal assistance;Patient able to independently direct caregiver Donning/doffing sling/immobilizer: Minimal assistance;Patient able to independently direct caregiver Correct positioning of sling/immobilizer: Minimal assistance;Patient able to independently direct caregiver ROM for elbow, wrist and digits of operated UE: Minimal assistance;Patient able to independently direct caregiver Sling wearing schedule (on at all times/off for ADL's): Minimal assistance;Patient able to independently direct caregiver Proper positioning of operated UE when showering: Minimal assistance Positioning of UE while sleeping: Minimal assistance;Patient able to independently direct caregiver    Home Living Family/patient expects to be discharged to:: Private residence Living Arrangements: Spouse/significant other Available Help at Discharge: Family                                    Prior Functioning/Environment Level of Independence: Independent                          OT Goals(Current goals can be found in the care plan section) Acute Rehab OT Goals Patient Stated Goal: home today OT Goal Formulation: With patient Time For  Goal Achievement: 08/14/21 Potential to Achieve Goals: Good  OT Frequency:      AM-PAC OT "6 Clicks" Daily Activity     Outcome Measure Help from another person eating meals?: None Help from another person taking care of personal grooming?: A Little Help from another person toileting, which includes using toliet, bedpan, or urinal?: A Little Help from another person bathing (including washing, rinsing, drying)?: A Little Help from another person to put on and taking off regular upper body clothing?: A Little Help from another  person to put on and taking off regular lower body clothing?: A Little 6 Click Score: 19   End of Session Equipment Utilized During Treatment: Other (comment) (sling) Nurse Communication: Mobility status  Activity Tolerance: Patient tolerated treatment well Patient left: in chair;with call bell/phone within reach                   Time: 1000-1021 OT Time Calculation (min): 21 min Charges:  OT General Charges $OT Visit: 1 Visit OT Evaluation $OT Eval Low Complexity: 1 Low  Troy Garrison, OT Acute Rehabilitation Services [phone] Office- [phone]     [name], Troy Garrison 08/14/2021, 10:37 AM

## 2021-08-17 NOTE — Discharge Summary (Signed)
In most cases prophylactic antibiotics for Dental procdeures after total joint surgery are not necessary.  Exceptions are as follows:  1. History of prior total joint infection  2. Severely immunocompromised (Organ Transplant, cancer chemotherapy, Rheumatoid biologic meds such as [place])  3. Poorly controlled diabetes (A1C &gt; 8.0, blood glucose over 200)  If you have one of these conditions, contact your surgeon for an antibiotic prescription, prior to your dental procedure. Orthopedic Discharge Summary        Physician Discharge Summary  Patient ID: Troy Garrison MRN: [id number] DOB/AGE: [birth year] 80 y.o.  Admit date: 08/13/2021 Discharge date: 08/14/21  Procedures:  Procedure(s) (LRB): REVERSE SHOULDER ARTHROPLASTY (Left)  Attending Physician:  Dr. Esmond Plants  Admission Diagnoses:   left shoulder cuff arthropathy  Discharge Diagnoses:  left shoulder cuff arthropathy   Past Medical History:  Diagnosis Date   Atrial flutter ([place])    S/P RFCA with Dr. Caryl Comes 6/12   Bursitis of shoulder    Dysrhythmia    GERD (gastroesophageal reflux disease)    Hearing loss, neural    Left ear   Hemorrhoids    History of Guillain-Barre syndrome 2003   Hyperlipidemia    Hypertension    Injury of tendon of long head of biceps    Left axis deviation 1999   Osteoarthritis    Peptic ulcer 1964   with bleed ; transfused   Prediabetes 07/02/2008   Skin cancer     PCP: Vivi Barrack, MD   Discharged Condition: good  Hospital Course:  Patient underwent the above stated procedure on 08/13/2021. Patient tolerated the procedure well and brought to the recovery room in good condition and subsequently to the floor. Patient had an uncomplicated hospital course and was stable for discharge.   Disposition: Discharge disposition: 01-Home or Self Care      with follow up in 2 weeks    Follow-up Information     Netta Cedars, MD. Call in 2 week(s).   Specialty:  Orthopedic Surgery Why: call [phone] for appt Contact information: [address]  [place] [address] [phone]                 Dental Antibiotics:  In most cases prophylactic antibiotics for Dental procdeures after total joint surgery are not necessary.  Exceptions are as follows:  1. History of prior total joint infection  2. Severely immunocompromised (Organ Transplant, cancer chemotherapy, Rheumatoid biologic meds such as [place])  3. Poorly controlled diabetes (A1C &gt; 8.0, blood glucose over 200)  If you have one of these conditions, contact your surgeon for an antibiotic prescription, prior to your dental procedure.    Allergies as of 08/14/2021   No Known Allergies      Medication List     TAKE these medications    aspirin 325 MG EC tablet Take 325 mg by mouth every other day.   esomeprazole 40 MG capsule Commonly known as: NEXIUM Take 1 capsule (40 mg total) by mouth daily as needed.   Fish Oil 1200 MG Caps Take 1,200 mg by mouth daily.   ibuprofen 200 MG tablet Commonly known as: ADVIL Take 200 mg by mouth every 8 (eight) hours as needed for moderate pain.   lisinopril 10 MG tablet Commonly known as: ZESTRIL Take 1 tablet (10 mg total) by mouth daily.   Melatonin 10 MG Caps Take 10 mg by mouth at bedtime as needed (sleep).   methocarbamol 500 MG tablet Commonly known as:  Robaxin Take 1 tablet (500 mg total) by mouth every 8 (eight) hours as needed for muscle spasms.   multivitamin tablet Take 1 tablet by mouth daily.   ondansetron 4 MG tablet Commonly known as: Zofran Take 1 tablet (4 mg total) by mouth every 8 (eight) hours as needed for nausea, vomiting or refractory nausea / vomiting.   oxyCODONE-acetaminophen 5-325 MG tablet Commonly known as: Percocet Take 1 tablet by mouth every 4 (four) hours as needed for severe pain or moderate pain.   pravastatin 40 MG tablet Commonly known as:  PRAVACHOL Take 0.5 tablets (20 mg total) by mouth daily.          Signed: Ventura Bruns 08/17/2021, 9:27 AM  [facility] is now [employer] [address], [place], [place],  [address] Phone: [place]

## 2021-08-18 ENCOUNTER — Encounter (HOSPITAL_COMMUNITY): Payer: Self-pay | Admitting: Orthopedic Surgery

## 2021-08-24 DIAGNOSIS — Z4789 Encounter for other orthopedic aftercare: Secondary | ICD-10-CM | POA: Diagnosis not present

## 2021-09-01 DIAGNOSIS — D485 Neoplasm of uncertain behavior of skin: Secondary | ICD-10-CM | POA: Diagnosis not present

## 2021-09-01 DIAGNOSIS — Z85828 Personal history of other malignant neoplasm of skin: Secondary | ICD-10-CM | POA: Diagnosis not present

## 2021-09-01 DIAGNOSIS — C44629 Squamous cell carcinoma of skin of left upper limb, including shoulder: Secondary | ICD-10-CM | POA: Diagnosis not present

## 2021-09-13 DIAGNOSIS — Z4789 Encounter for other orthopedic aftercare: Secondary | ICD-10-CM | POA: Diagnosis not present

## 2021-09-22 ENCOUNTER — Other Ambulatory Visit: Payer: Self-pay

## 2021-09-22 ENCOUNTER — Encounter (HOSPITAL_COMMUNITY): Payer: Self-pay | Admitting: Orthopedic Surgery

## 2021-09-22 NOTE — Progress Notes (Addendum)
For Short Stay: Troy Garrison appointment date:  Date of COVID positive in last 24 days:   For Anesthesia: PCP - Troy Barrack, MD last office visit and pre op exam 07/27/2021 in epic Cardiologist - remote history with Dr. Caryl Comes  Chest x-ray - greater than 1 year in epic EKG - 08/13/2021 in epic Stress Test -  ECHO - greater than 2 years in epic Cardiac Cath -  Pacemaker/ICD device last checked:  Sleep Study -  CPAP -   Fasting Blood Sugar - N/A Checks Blood Sugar __N/A___ times a day Hgb A1c 6.6 on 07/27/2021 in epic  Blood Thinner Instructions: Aspirin Instructions: Last Dose: 09/21/2021  Activity level: Able to exercise without chest pain and/or shortness of breath       Anesthesia review: Atrial Flutter with cardiac ablations 04/13/2011 prolonged QT on EKG 07/2021  Patient denies shortness of breath, fever, cough and chest pain at PAT appointment   Patient verbalized understanding of instructions that were given to them at the PAT appointment. Patient was also instructed that they will need to review over the PAT instructions again at home before surgery.

## 2021-09-23 NOTE — H&P (Signed)
Patient's anticipated LOS is less than 2 midnights, meeting these requirements: - Younger than 69 - Lives within 1 hour of care - Has a competent adult at home to recover with post-op recover - NO history of  - Chronic pain requiring opiods  - Diabetes  - Coronary Artery Disease  - Heart failure  - Heart attack  - Stroke  - DVT/VTE  - Cardiac arrhythmia  - Respiratory Failure/COPD  - Renal failure  - Anemia  - Advanced Liver disease     Troy Garrison is an 80 y.o. male.    Chief Complaint: left shoulder pain   HPI: Pt is a 80 y.o. male complaining of left shoulder pain for multiple years. Pain had continually increased since the beginning. X-rays in the clinic show previous reverse total shoulder with subluxation symptoms.  Risks, benefits and expectations were discussed with the patient. Patient understand the risks, benefits and expectations and wishes to proceed with surgery.   PCP:  Vivi Barrack, MD  D/C Plans: Home  PMH: Past Medical History:  Diagnosis Date   Atrial flutter ([place])    S/P RFCA with Dr. Caryl Comes 6/12   Bursitis of shoulder    Dysrhythmia    GERD (gastroesophageal reflux disease)    Hearing loss, neural    Left ear   Hemorrhoids    History of Guillain-Barre syndrome 2003   Hyperlipidemia    Hypertension    Injury of tendon of long head of biceps    Left axis deviation 1999   Osteoarthritis    Peptic ulcer 1964   with bleed ; transfused   Prediabetes 07/02/2008   Skin cancer     PSH: Past Surgical History:  Procedure Laterality Date   ABLATION OF DYSRHYTHMIC FOCUS  2012    for AF;Dr Caryl Comes   colonoscopy with polypectomy  2011   Internal hemorrhoids; [facility]   INGUINAL HERNIA REPAIR  07/16/2020   Procedure: DIAGNOSTIC LAPAROSCOPY, LAPAROSCOPIC ABDOMINAL WALL HERNIA REPAIR WITH MESH;  Surgeon: Alphonsa Overall, MD;  Location: [facility];  Service: General;;   INTRAOCULAR LENS INSERTION     MELANOMA EXCISION     on scalp January 2019    REPLACEMENT TOTAL KNEE  2000   Left Knee   REVERSE SHOULDER ARTHROPLASTY Left 08/13/2021   Procedure: REVERSE SHOULDER ARTHROPLASTY;  Surgeon: Netta Cedars, MD;  Location: [facility];  Service: Orthopedics;  Laterality: Left;  with ISB   TOTAL KNEE ARTHROPLASTY  09/21/2011   Procedure: TOTAL KNEE ARTHROPLASTY;  Surgeon: Tobi Bastos;  Location: [facility];  Service: Orthopedics;  Laterality: Right;   VASECTOMY     Scar tissue removed 2 years after the vasectomy    Social History:  reports that he quit smoking about 28 years ago. His smoking use included cigarettes. He has never used smokeless tobacco. He reports current alcohol use. He reports that he does not use drugs.  Allergies:  No Known Allergies  Medications: No current facility-administered medications for this encounter.   Current Outpatient Medications  Medication Sig Dispense Refill   aspirin 325 MG EC tablet Take 325 mg by mouth every other day.     esomeprazole (NEXIUM) 40 MG capsule Take 1 capsule (40 mg total) by mouth daily as needed. 90 capsule 3   ibuprofen (ADVIL,MOTRIN) 200 MG tablet Take 200-400 mg by mouth every 8 (eight) hours as needed for moderate pain.     lisinopril (ZESTRIL) 10 MG tablet Take 1 tablet (10 mg total) by mouth  daily. 90 tablet 3   Melatonin 12 MG TABS Take 12 mg by mouth at bedtime.     Multiple Vitamin (MULTIVITAMIN) tablet Take 1 tablet by mouth daily.     Omega-3 Fatty Acids (FISH OIL) 1200 MG CAPS Take 1,200 mg by mouth daily.     ondansetron (ZOFRAN) 4 MG tablet Take 1 tablet (4 mg total) by mouth every 8 (eight) hours as needed for nausea, vomiting or refractory nausea / vomiting. 30 tablet 1   pravastatin (PRAVACHOL) 40 MG tablet Take 0.5 tablets (20 mg total) by mouth daily. 45 tablet 1   methocarbamol (ROBAXIN) 500 MG tablet Take 1 tablet (500 mg total) by mouth every 8 (eight) hours as needed for muscle spasms. (Patient not taking: Reported on 09/23/2021) 40 tablet 1    oxyCODONE-acetaminophen (PERCOCET) 5-325 MG tablet Take 1 tablet by mouth every 4 (four) hours as needed for severe pain or moderate pain. (Patient not taking: Reported on 09/23/2021) 30 tablet 0    No results found for this or any previous visit (from the past 48 hour(s)). No results found.  ROS: Pain with rom of the left upper extremity  Physical Exam: Alert and oriented 80 y.o. male in no acute distress Cranial nerves 2-12 intact Cervical spine: full rom with no tenderness, nv intact distally Chest: active breath sounds bilaterally, no wheeze rhonchi or rales Heart: regular rate and rhythm, no murmur Abd: non tender non distended with active bowel sounds Hip is stable with rom  Left shoulder painful rom with some subluxation of reverse shoulder  Assessment/Plan Assessment: left shoulder subluxation after reverse total shoulder  Plan:  Patient will undergo a left shoulder revision by Dr. Veverly Fells at [place] Risks benefits and expectations were discussed with the patient. Patient understand risks, benefits and expectations and wishes to proceed. Preoperative templating of the joint replacement has been completed, documented, and submitted to the Operating Room personnel in order to optimize intra-operative equipment management.   Merla Riches PA-C, MPAS [facility] is now [employer] [address], [place], [place], [place] [address] Phone: [phone] [email or web address] Facebook  [employer]

## 2021-09-23 NOTE — Anesthesia Preprocedure Evaluation (Addendum)
Anesthesia Evaluation  Patient identified by MRN, date of birth, ID band Patient awake    Reviewed: Allergy & Precautions, NPO status , Patient's Chart, lab work & pertinent test results  Airway Mallampati: II       Dental  (+) Dental Advisory Given, Teeth Intact   Pulmonary neg pulmonary ROS, former smoker,    Pulmonary exam normal        Cardiovascular hypertension, Pt. on medications Normal cardiovascular exam+ dysrhythmias  Rhythm:Regular Rate:Normal     Neuro/Psych  Neuromuscular disease Troy Garrison  2003) negative psych ROS   GI/Hepatic Neg liver ROS, PUD, GERD  Medicated and Controlled,  Endo/Other  negative endocrine ROS  Renal/GU Lab Results      Component                Value               Date                      CREATININE               0.69                08/14/2021                BUN                      13                  08/14/2021                NA                       131 (L)             08/14/2021                K                        4.6                 08/14/2021                       negative genitourinary   Musculoskeletal  (+) Arthritis ,   Abdominal (+) + obese (BMi 35.26),   Peds  Hematology negative hematology ROS (+) Lab Results      Component                Value               Date                         HGB                      14.6                08/14/2021                HCT                      44.6                08/14/2021  PLT                      190.0               07/27/2021              Anesthesia Other Findings   Reproductive/Obstetrics                            Anesthesia Physical Anesthesia Plan  ASA: 3  Anesthesia Plan: General   Post-op Pain Management:    Induction: Intravenous  PONV Risk Score and Plan: 2 and Treatment may vary due to age or medical condition and Ondansetron  Airway Management  Planned: Oral ETT  Additional Equipment: None  Intra-op Plan:   Post-operative Plan: Extubation in OR  Informed Consent:     Dental advisory given  Plan Discussed with: CRNA  Anesthesia Plan Comments: (GA no block patient feels like residual nerve damage on operative side could have been due to the ISB last time)       Anesthesia Quick Evaluation

## 2021-09-24 ENCOUNTER — Encounter (HOSPITAL_COMMUNITY)
Admission: RE | Disposition: A | Discharge status: Home / Self Care | Payer: Self-pay | Source: Home / Self Care | Attending: Orthopedic Surgery

## 2021-09-24 ENCOUNTER — Encounter (HOSPITAL_COMMUNITY): Payer: Self-pay | Admitting: Orthopedic Surgery

## 2021-09-24 ENCOUNTER — Ambulatory Visit (HOSPITAL_COMMUNITY)
Admission: RE | Admit: 2021-09-24 | Discharge: 2021-09-24 | Disposition: A | Discharge status: Home / Self Care | Payer: PPO | Attending: Orthopedic Surgery | Admitting: Orthopedic Surgery

## 2021-09-24 ENCOUNTER — Ambulatory Visit (HOSPITAL_COMMUNITY): Payer: PPO | Admitting: Anesthesiology

## 2021-09-24 DIAGNOSIS — T84038A Mechanical loosening of other internal prosthetic joint, initial encounter: Secondary | ICD-10-CM | POA: Diagnosis not present

## 2021-09-24 DIAGNOSIS — R7303 Prediabetes: Secondary | ICD-10-CM

## 2021-09-24 DIAGNOSIS — Z79899 Other long term (current) drug therapy: Secondary | ICD-10-CM | POA: Diagnosis not present

## 2021-09-24 DIAGNOSIS — M65812 Other synovitis and tenosynovitis, left shoulder: Secondary | ICD-10-CM | POA: Insufficient documentation

## 2021-09-24 DIAGNOSIS — K219 Gastro-esophageal reflux disease without esophagitis: Secondary | ICD-10-CM | POA: Insufficient documentation

## 2021-09-24 DIAGNOSIS — Z87891 Personal history of nicotine dependence: Secondary | ICD-10-CM | POA: Diagnosis not present

## 2021-09-24 DIAGNOSIS — Z20822 Contact with and (suspected) exposure to covid-19: Secondary | ICD-10-CM | POA: Insufficient documentation

## 2021-09-24 DIAGNOSIS — Z01818 Encounter for other preprocedural examination: Secondary | ICD-10-CM

## 2021-09-24 DIAGNOSIS — M25312 Other instability, left shoulder: Secondary | ICD-10-CM | POA: Diagnosis not present

## 2021-09-24 DIAGNOSIS — Z96612 Presence of left artificial shoulder joint: Secondary | ICD-10-CM | POA: Diagnosis not present

## 2021-09-24 DIAGNOSIS — T84098A Other mechanical complication of other internal joint prosthesis, initial encounter: Secondary | ICD-10-CM | POA: Diagnosis not present

## 2021-09-24 DIAGNOSIS — E785 Hyperlipidemia, unspecified: Secondary | ICD-10-CM | POA: Diagnosis not present

## 2021-09-24 DIAGNOSIS — I10 Essential (primary) hypertension: Secondary | ICD-10-CM | POA: Insufficient documentation

## 2021-09-24 HISTORY — PX: REVISION TOTAL SHOULDER TO REVERSE TOTAL SHOULDER: SHX6313

## 2021-09-24 LAB — BASIC METABOLIC PANEL
Anion gap: 7 (ref 5–15)
BUN: 16 mg/dL (ref 8–23)
CO2: 23 mmol/L (ref 22–32)
Calcium: 8.9 mg/dL (ref 8.9–10.3)
Chloride: 101 mmol/L (ref 98–111)
Creatinine, Ser: 0.78 mg/dL (ref 0.61–1.24)
GFR, Estimated: >60 mL/min (ref >60)
Glucose, Bld: 157 mg/dL — ABNORMAL HIGH (ref 70–99)
Potassium: 3.8 mmol/L (ref 3.5–5.1)
Sodium: 131 mmol/L — ABNORMAL LOW (ref 135–145)

## 2021-09-24 LAB — SURGICAL PCR SCREEN
MRSA, PCR: NEGATIVE
Staphylococcus aureus: NEGATIVE

## 2021-09-24 LAB — CBC
HCT: 42.9 % (ref 39.0–52.0)
Hemoglobin: 14.2 g/dL (ref 13.0–17.0)
MCH: 30.4 pg (ref 26.0–34.0)
MCHC: 33.1 g/dL (ref 30.0–36.0)
MCV: 91.9 fL (ref 80.0–100.0)
Platelets: 230 10*3/uL (ref 150–400)
RBC: 4.67 MIL/uL (ref 4.22–5.81)
RDW: 13.3 % (ref 11.5–15.5)
WBC: 6.4 10*3/uL (ref 4.0–10.5)
nRBC: 0 % (ref 0.0–0.2)

## 2021-09-24 LAB — SARS CORONAVIRUS 2 BY RT PCR (HOSPITAL ORDER, PERFORMED IN ~~LOC~~ HOSPITAL LAB): SARS Coronavirus 2: NEGATIVE

## 2021-09-24 SURGERY — REVISION, REVERSE TOTAL ARTHROPLASTY, SHOULDER
Anesthesia: General | Site: Shoulder | Laterality: Left

## 2021-09-24 MED ORDER — FENTANYL CITRATE (PF) 250 MCG/5ML IJ SOLN
INTRAMUSCULAR | Status: AC
  Filled 2021-09-24: qty 5

## 2021-09-24 MED ORDER — PHENYLEPHRINE HCL (PRESSORS) 10 MG/ML IV SOLN
INTRAVENOUS | Status: AC
  Filled 2021-09-24: qty 1

## 2021-09-24 MED ORDER — FENTANYL CITRATE PF 50 MCG/ML IJ SOSY
50.0000 ug | PREFILLED_SYRINGE | Freq: Once | INTRAMUSCULAR | Status: DC
  Filled 2021-09-24: qty 2

## 2021-09-24 MED ORDER — CHLORHEXIDINE GLUCONATE 0.12 % MT SOLN
15.0000 mL | Freq: Once | OROMUCOSAL | Status: AC
  Administered 2021-09-24: 15 mL via OROMUCOSAL

## 2021-09-24 MED ORDER — HYDROMORPHONE HCL 1 MG/ML IJ SOLN
0.2500 mg | INTRAMUSCULAR | Status: DC | PRN
Start: 2021-09-24 — End: 2021-09-25

## 2021-09-24 MED ORDER — BUPIVACAINE-EPINEPHRINE (PF) 0.25% -1:200000 IJ SOLN
INTRAMUSCULAR | Status: DC | PRN
  Administered 2021-09-24: 10 mL

## 2021-09-24 MED ORDER — MIDAZOLAM HCL 2 MG/2ML IJ SOLN
1.0000 mg | Freq: Once | INTRAMUSCULAR | Status: DC
  Filled 2021-09-24: qty 2

## 2021-09-24 MED ORDER — PROPOFOL 10 MG/ML IV BOLUS
INTRAVENOUS | Status: DC | PRN
  Administered 2021-09-24: 200 mg via INTRAVENOUS

## 2021-09-24 MED ORDER — FENTANYL CITRATE (PF) 100 MCG/2ML IJ SOLN
INTRAMUSCULAR | Status: AC
  Filled 2021-09-24: qty 2

## 2021-09-24 MED ORDER — SUGAMMADEX SODIUM 500 MG/5ML IV SOLN
INTRAVENOUS | Status: DC | PRN
  Administered 2021-09-24: 500 mg via INTRAVENOUS

## 2021-09-24 MED ORDER — CEFAZOLIN SODIUM-DEXTROSE 2-4 GM/100ML-% IV SOLN
2.0000 g | INTRAVENOUS | Status: AC
  Administered 2021-09-24: 2 g via INTRAVENOUS
  Filled 2021-09-24: qty 100

## 2021-09-24 MED ORDER — OXYCODONE-ACETAMINOPHEN 5-325 MG PO TABS
1.0000 | ORAL_TABLET | ORAL | 0 refills | Status: DC | PRN
Start: 2021-09-24 — End: 2021-12-10

## 2021-09-24 MED ORDER — SODIUM CHLORIDE 0.9 % IR SOLN
Status: DC | PRN
  Administered 2021-09-24: 1000 mL

## 2021-09-24 MED ORDER — LIDOCAINE 2% (20 MG/ML) 5 ML SYRINGE
INTRAMUSCULAR | Status: DC | PRN
  Administered 2021-09-24: 100 mg via INTRAVENOUS

## 2021-09-24 MED ORDER — ONDANSETRON HCL 4 MG/2ML IJ SOLN: 4.0000 mg | Freq: Once | INTRAMUSCULAR | Status: DC | PRN

## 2021-09-24 MED ORDER — PHENYLEPHRINE HCL-NACL 20-0.9 MG/250ML-% IV SOLN
INTRAVENOUS | Status: DC | PRN
  Administered 2021-09-24: 30 ug/min via INTRAVENOUS

## 2021-09-24 MED ORDER — PROMETHAZINE HCL 25 MG/ML IJ SOLN
6.2500 mg | INTRAMUSCULAR | Status: DC | PRN
Start: 2021-09-24 — End: 2021-09-25

## 2021-09-24 MED ORDER — DEXAMETHASONE SODIUM PHOSPHATE 10 MG/ML IJ SOLN
INTRAMUSCULAR | Status: DC | PRN
  Administered 2021-09-24: 10 mg via INTRAVENOUS

## 2021-09-24 MED ORDER — ORAL CARE MOUTH RINSE: 15.0000 mL | Freq: Once | OROMUCOSAL | Status: AC

## 2021-09-24 MED ORDER — ONDANSETRON HCL 4 MG/2ML IJ SOLN
INTRAMUSCULAR | Status: DC | PRN
  Administered 2021-09-24: 4 mg via INTRAVENOUS

## 2021-09-24 MED ORDER — EPHEDRINE SULFATE-NACL 50-0.9 MG/10ML-% IV SOSY
PREFILLED_SYRINGE | INTRAVENOUS | Status: DC | PRN
  Administered 2021-09-24: 7.5 mg via INTRAVENOUS

## 2021-09-24 MED ORDER — HYDROMORPHONE HCL 2 MG/ML IJ SOLN
INTRAMUSCULAR | Status: AC
  Filled 2021-09-24: qty 1

## 2021-09-24 MED ORDER — FENTANYL CITRATE PF 50 MCG/ML IJ SOSY: 25.0000 ug | PREFILLED_SYRINGE | INTRAMUSCULAR | Status: DC | PRN

## 2021-09-24 MED ORDER — LACTATED RINGERS IV SOLN: INTRAVENOUS | Status: DC

## 2021-09-24 MED ORDER — MIDAZOLAM HCL 2 MG/2ML IJ SOLN
INTRAMUSCULAR | Status: AC
  Filled 2021-09-24: qty 2

## 2021-09-24 MED ORDER — ACETAMINOPHEN 10 MG/ML IV SOLN: 1000.0000 mg | Freq: Once | INTRAVENOUS | Status: DC | PRN

## 2021-09-24 MED ORDER — HYDROMORPHONE HCL 1 MG/ML IJ SOLN
INTRAMUSCULAR | Status: DC | PRN
  Administered 2021-09-24 (×2): .5 mg via INTRAVENOUS

## 2021-09-24 MED ORDER — PROPOFOL 10 MG/ML IV BOLUS
INTRAVENOUS | Status: AC
  Filled 2021-09-24: qty 20

## 2021-09-24 MED ORDER — ROCURONIUM BROMIDE 10 MG/ML (PF) SYRINGE
PREFILLED_SYRINGE | INTRAVENOUS | Status: DC | PRN
  Administered 2021-09-24: 100 mg via INTRAVENOUS

## 2021-09-24 MED ORDER — BUPIVACAINE-EPINEPHRINE 0.25% -1:200000 IJ SOLN
INTRAMUSCULAR | Status: AC
  Filled 2021-09-24: qty 1

## 2021-09-24 MED ORDER — FENTANYL CITRATE (PF) 100 MCG/2ML IJ SOLN
INTRAMUSCULAR | Status: DC | PRN
  Administered 2021-09-24: 100 ug via INTRAVENOUS
  Administered 2021-09-24: 50 ug via INTRAVENOUS

## 2021-09-24 SURGICAL SUPPLY — 64 items
AID PSTN UNV HD RSTRNT DISP (MISCELLANEOUS) ×1
BAG COUNTER SPONGE SURGICOUNT (BAG) ×1 IMPLANT
BAG SPEC THK2 15X12 ZIP CLS (MISCELLANEOUS) ×1
BAG SPNG CNTER NS LX DISP (BAG) ×1
BAG ZIPLOCK 12X15 (MISCELLANEOUS) ×1 IMPLANT
BIT DRILL 1.6MX128 (BIT) IMPLANT
BLADE SAG 18X100X1.27 (BLADE) ×2 IMPLANT
CNTNR URN SCR LID CUP LEK RST (MISCELLANEOUS) IMPLANT
CONT SPEC 4OZ STRL OR WHT (MISCELLANEOUS) ×2
COVER BACK TABLE 60X90IN (DRAPES) ×2 IMPLANT
COVER SURGICAL LIGHT HANDLE (MISCELLANEOUS) ×2 IMPLANT
DECANTER SPIKE VIAL GLASS SM (MISCELLANEOUS) ×2 IMPLANT
DRAPE INCISE IOBAN 66X45 STRL (DRAPES) ×2 IMPLANT
DRAPE ORTHO SPLIT 77X108 STRL (DRAPES) ×4
DRAPE SHEET LG 3/4 BI-LAMINATE (DRAPES) ×2 IMPLANT
DRAPE SURG ORHT 6 SPLT 77X108 (DRAPES) ×2 IMPLANT
DRAPE TOP 10253 STERILE (DRAPES) ×2 IMPLANT
DRAPE U-SHAPE 47X51 STRL (DRAPES) ×2 IMPLANT
DRSG ADAPTIC 3X8 NADH LF (GAUZE/BANDAGES/DRESSINGS) ×2 IMPLANT
DRSG PAD ABDOMINAL 8X10 ST (GAUZE/BANDAGES/DRESSINGS) ×2 IMPLANT
DURAPREP 26ML APPLICATOR (WOUND CARE) ×2 IMPLANT
ELECT BLADE TIP CTD 4 INCH (ELECTRODE) ×2 IMPLANT
ELECT NDL TIP 2.8 STRL (NEEDLE) ×1 IMPLANT
ELECT NEEDLE TIP 2.8 STRL (NEEDLE) ×2 IMPLANT
ELECT REM PT RETURN 15FT ADLT (MISCELLANEOUS) ×2 IMPLANT
FACESHIELD WRAPAROUND (MASK) ×2 IMPLANT
FACESHIELD WRAPAROUND OR TEAM (MASK) ×1 IMPLANT
GAUZE SPONGE 4X4 12PLY STRL (GAUZE/BANDAGES/DRESSINGS) ×2 IMPLANT
GLOVE SURG ORTHO LTX SZ7.5 (GLOVE) ×2 IMPLANT
GLOVE SURG ORTHO LTX SZ8.5 (GLOVE) ×2 IMPLANT
GLOVE SURG UNDER POLY LF SZ7.5 (GLOVE) ×2 IMPLANT
GLOVE SURG UNDER POLY LF SZ8.5 (GLOVE) ×2 IMPLANT
GOWN STRL REUS W/TWL XL LVL3 (GOWN DISPOSABLE) ×4 IMPLANT
INSERT HUMERAL M/39 +3/CNSTRND (Miscellaneous) ×1 IMPLANT
KIT BASIN OR (CUSTOM PROCEDURE TRAY) ×2 IMPLANT
KIT TURNOVER KIT A (KITS) IMPLANT
MANIFOLD NEPTUNE II (INSTRUMENTS) ×2 IMPLANT
NDL MAYO CATGUT SZ4 TPR NDL (NEEDLE) IMPLANT
NEEDLE MAYO CATGUT SZ4 (NEEDLE) IMPLANT
NS IRRIG 1000ML POUR BTL (IV SOLUTION) ×2 IMPLANT
PACK SHOULDER (CUSTOM PROCEDURE TRAY) ×2 IMPLANT
PROTECTOR NERVE ULNAR (MISCELLANEOUS) ×2 IMPLANT
RESTRAINT HEAD UNIVERSAL NS (MISCELLANEOUS) ×2 IMPLANT
SLING ARM FOAM STRAP LRG (SOFTGOODS) ×1 IMPLANT
SMARTMIX MINI TOWER (MISCELLANEOUS)
SPACER SHLD UNI REV 39 +9 (Spacer) ×1 IMPLANT
SPONGE T-LAP 18X18 ~~LOC~~+RFID (SPONGE) ×1 IMPLANT
SPONGE T-LAP 4X18 ~~LOC~~+RFID (SPONGE) ×1 IMPLANT
STAPLER VISISTAT 35W (STAPLE) ×1 IMPLANT
STRIP CLOSURE SKIN 1/2X4 (GAUZE/BANDAGES/DRESSINGS) ×2 IMPLANT
SUCTION FRAZIER HANDLE 10FR (MISCELLANEOUS) ×2
SUCTION TUBE FRAZIER 10FR DISP (MISCELLANEOUS) ×1 IMPLANT
SUT FIBERWIRE #2 38 T-5 BLUE (SUTURE) ×4
SUT MNCRL AB 4-0 PS2 18 (SUTURE) ×2 IMPLANT
SUT PDS AB 0 CT1 36 (SUTURE) ×1 IMPLANT
SUT VIC AB 0 CT1 36 (SUTURE) ×4 IMPLANT
SUT VIC AB 0 CT2 27 (SUTURE) ×2 IMPLANT
SUT VIC AB 2-0 CT1 27 (SUTURE) ×2
SUT VIC AB 2-0 CT1 TAPERPNT 27 (SUTURE) ×1 IMPLANT
SUTURE FIBERWR #2 38 T-5 BLUE (SUTURE) ×2 IMPLANT
SWAB COLLECTION DEVICE MRSA (MISCELLANEOUS) ×1 IMPLANT
SWAB CULTURE ESWAB REG 1ML (MISCELLANEOUS) ×1 IMPLANT
TOWEL OR 17X26 10 PK STRL BLUE (TOWEL DISPOSABLE) ×2 IMPLANT
TOWER SMARTMIX MINI (MISCELLANEOUS) IMPLANT

## 2021-09-24 NOTE — Interval H&P Note (Signed)
History and Physical Interval Note:  09/24/2021 2:00 PM  Troy Garrison  has presented today for surgery, with the diagnosis of left shoulder instability after reverse total shoulder.  The various methods of treatment have been discussed with the patient and family. After consideration of risks, benefits and other options for treatment, the patient has consented to  Procedure(s): Left shoulder revision reverse total shoulder arthroplasty with poly exchange (Left) as a surgical intervention.  The patient's history has been reviewed, patient examined, no change in status, stable for surgery.  I have reviewed the patient's chart and labs.  Questions were answered to the patient's satisfaction.     [name]

## 2021-09-24 NOTE — Transfer of Care (Signed)
Immediate Anesthesia Transfer of Care Note  Patient: Troy Garrison  Procedure(s) Performed: Left shoulder revision reverse total shoulder arthroplasty with poly exchange (Left: Shoulder)  Patient Location: PACU  Anesthesia Type:General  Level of Consciousness: awake, alert , oriented and patient cooperative  Airway & Oxygen Therapy: Patient Spontanous Breathing and Patient connected to face mask oxygen  Post-op Assessment: Report given to RN and Post -op Vital signs reviewed and stable  Post vital signs: Reviewed and stable  Last Vitals:  Vitals Value Taken Time  BP 165/79 09/24/21 1745  Temp    Pulse 88 09/24/21 1748  Resp 21 09/24/21 1748  SpO2 100 % 09/24/21 1748  Vitals shown include unvalidated device data.  Last Pain:  Vitals:   09/24/21 1351  TempSrc:   PainSc: 0-No pain         Complications: No notable events documented.

## 2021-09-24 NOTE — Brief Op Note (Signed)
09/24/2021  5:40 PM  PATIENT:  Troy Garrison  80 y.o. male  PRE-OPERATIVE DIAGNOSIS:  left shoulder instability after reverse total shoulder  POST-OPERATIVE DIAGNOSIS:  left shoulder instability after reverse total shoulder  PROCEDURE:  Procedure(s): Left shoulder revision reverse total shoulder arthroplasty with poly exchange (Left)  SURGEON:  Surgeon(s) and Role:    Netta Cedars, MD - Primary  PHYSICIAN ASSISTANT:   ASSISTANTS: Ventura Bruns, PA-C   ANESTHESIA:  general  EBL:  100 mL   BLOOD ADMINISTERED:none  DRAINS: none   LOCAL MEDICATIONS USED:  MARCAINE     SPECIMEN:  Source of Specimen:  left shoulder, fluid and tissue from left shoulder joint  DISPOSITION OF SPECIMEN:   Micro  COUNTS:  YES  TOURNIQUET:  * No tourniquets in log *  DICTATION: .Other Dictation: Dictation Number [id number]  PLAN OF CARE: Discharge to home after PACU  PATIENT DISPOSITION:  PACU - hemodynamically stable.   Delay start of Pharmacological VTE agent (>24hrs) due to surgical blood loss or risk of bleeding: not applicable

## 2021-09-24 NOTE — Anesthesia Procedure Notes (Addendum)
Procedure Name: Intubation Date/Time: 09/24/2021 4:20 PM Performed by: Sharlette Dense, CRNA Pre-anesthesia Checklist: Patient identified, Emergency Drugs available, Suction available and Patient being monitored Patient Re-evaluated:Patient Re-evaluated prior to induction Oxygen Delivery Method: Circle system utilized Preoxygenation: Pre-oxygenation with 100% oxygen Induction Type: IV induction Ventilation: Mask ventilation without difficulty Laryngoscope Size: Glidescope and 3 Grade View: Grade I Tube type: Parker flex tip Tube size: 7.5 mm Number of attempts: 1 Airway Equipment and Method: Stylet and Oral airway Placement Confirmation: ETT inserted through vocal cords under direct vision, positive ETCO2 and breath sounds checked- equal and bilateral Secured at: 23 cm Tube secured with: Tape Dental Injury: Teeth and Oropharynx as per pre-operative assessment  Difficulty Due To: Difficulty was anticipated and Difficult Airway- due to anterior larynx

## 2021-09-24 NOTE — Anesthesia Postprocedure Evaluation (Signed)
Anesthesia Post Note  Patient: Troy Garrison  Procedure(s) Performed: Left shoulder revision reverse total shoulder arthroplasty with poly exchange (Left: Shoulder)     Patient location during evaluation: PACU Anesthesia Type: General Level of consciousness: awake and alert, patient cooperative and oriented Pain management: pain level controlled Vital Signs Assessment: post-procedure vital signs reviewed and stable Respiratory status: spontaneous breathing, nonlabored ventilation and respiratory function stable Cardiovascular status: blood pressure returned to baseline and stable Postop Assessment: no apparent nausea or vomiting and adequate PO intake Anesthetic complications: no   No notable events documented.  Last Vitals:  Vitals:   09/24/21 1845 09/24/21 1915  BP: (!) 147/87 (!) 158/79  Pulse: 81 84  Resp: 17 15  Temp:    SpO2: 97% 98%    Last Pain:  Vitals:   09/24/21 1830  TempSrc:   PainSc: 0-No pain                 Loreal Schuessler,E. Briannia Laba

## 2021-09-24 NOTE — Discharge Instructions (Signed)
Ice to the shoulder constantly.  Keep the incision covered and clean and dry for one week, then ok to get it wet in the shower.  Do exercise as instructed several times per day.  DO NOT reach behind your back or push up out of a chair with the operative arm.  Use a sling while you are up and around for comfort, may remove while seated.  Keep pillow propped behind the operative elbow.  Please call Dr Veverly Fells at [phone] (cell) with any questions   Follow up with Dr Veverly Fells in two weeks in the office, call [phone] for appt

## 2021-09-25 NOTE — Op Note (Signed)
NAME: Troy Garrison, MALAND MEDICAL RECORD NO: [id number] ACCOUNT NO: [id number] DATE OF BIRTH: 1940/11/11 FACILITY: [facility] LOCATION: [facility] PHYSICIAN: Doran Heater. Veverly Fells, MD  Operative Report   DATE OF PROCEDURE: 09/24/2021  PREOPERATIVE DIAGNOSIS:  Left shoulder instability following reverse shoulder replacement.  POSTOPERATIVE DIAGNOSIS:  Left shoulder instability following reverse shoulder replacement.  PROCEDURE PERFORMED:  Left reverse shoulder replacement poly exchange (Arthrex).  ATTENDING SURGEON:  Esmond Plants, MD  ASSISTANT:  Darol Destine, [place], who was scrubbed and necessary for completion of surgery, he was scrubbed during the entirety of the surgery.  ANESTHESIA:  General anesthesia was used plus local.  ESTIMATED BLOOD LOSS:  Less than 100 mL.  FLUID REPLACEMENT:  1000 mL crystalloid.  Instrument counts were correct.  There were no complications.  Perioperative antibiotics were given.  SPECIMENS:  There was a specimen sent, both fluid for aerobic and anaerobic cultures routine and Gram stain as well as some tissue from the pseudocapsule from inside the joint that was sent as well to microbiology for aerobic and anaerobic cultures.  INDICATIONS:  The patient is a 80 year old male who is status post left reverse shoulder replacement approximately 3 months ago.  The patient had done well following surgery, but developed the feeling of instability over the last several weeks including  a couple of times when it sounds like he had a dislocation event and had to manipulate his shoulder to get it back in place.  The patient has had more subluxation than dislocation and has not required a reduction under anesthesia. Due to ongoing  instability, we discussed options, recommending revision surgery to the shoulder with lengthening him on the humeral side with a polyethylene exchange.  Informed consent obtained.  DESCRIPTION OF PROCEDURE:  After an adequate level of anesthesia  was achieved, the patient was positioned in the modified beach chair position.  Left shoulder correctly identified and sterilely prepped and draped in the usual manner.  I did manipulate  the shoulder with him asleep and could not duplicate any type of instability.  Everything felt smooth and tight.  After a sterile prep and drape and timeout, we entered the shoulder using the patient's prior deltopectoral incision, starting with a 10  blade scalpel utilizing Bovie down through the subcutaneous tissues, identifying the deltopectoral interval, developing that sharply and getting down to the pseudocapsule, we used a Cobb elevator to elevate the pectoralis medially and the conjoined  tendon medially and then the deltoid laterally.  There was an effusion noted, that effusion was basically clear looking fluid, did not have any blood, no pus.  We did go ahead and culture that for Gram stain, aerobic and anaerobic cultures and then also  took some pseudocapsule and sent that for culture as well.  We removed the pseudocapsule anteriorly.  We had good visualization of the shoulder.  The shoulder was concentrically located.  I could elicit a gap with an inferior pull of not too much force  of approximately 5 mm to a cm and that gap was present, whether externally rotated or internally rotated.  Additionally with internal rotation, a light little bit of superiorly and laterally directed force, was able to sublux the humerus.  It did  spontaneously reduce, thus I believe the patient was primarily dealing with this type of subluxation rather than a true dislocation, we then dislocated the shoulder.  I was able to clean out all the synovitis in the shoulder.  We irrigated thoroughly.   We then trialed first with  a 6 mm adapter that we placed on the humeral stem.  We first removed the patient's polyethylene, which looked pristine.  This was a +3 poly.  We then went ahead and put the +6 augment on and then a +3 poly, for a  net 3 mm of  increase, we then went up to a +9 adapter and then a +3 poly for a total of +6 on the additional length and that felt great.  We did use a constrained +3 liner and we ranged the shoulder. Everything stayed nice and concentrically reduced, with no gapping  at all with inferior pull and we eliminated the instability with internal rotation and lateral superiorly directed force, so we selected that. We used a +9 metal shim.  We placed it on the humeral tray and then screwed that into position against the  humeral tray and then we went ahead and used the +3 constrained liner.  We impacted that in position, reduced the shoulder, which had a nice little pop as it reduced.  The conjoined tendon was appropriately tensioned, no gapping at all with any rotation  and really could not sublux it whatsoever.  We irrigated thoroughly, closed the deltopectoral interval with 0 PDS suture interrupted, followed by 2-0 Vicryl for subcutaneous closure and staples for skin.  Sterile bandages were applied.  The patient was  placed in a shoulder sling and taken to recovery room in stable condition.  While we were in there, we checked the security of the glenosphere, which was secured.  I used the screwdriver, made sure the center set screw was secured.  We also checked the  security of the stem in the humerus and that was also secure.  No signs of any instability.  No overt signs of infection.  The patient was transported to recovery room in stable condition, having tolerated surgery well.   SHW D: 09/24/2021 5:47:19 pm T: 09/25/2021 3:02:00 am  JOB: [id number]/ [id number]

## 2021-09-28 ENCOUNTER — Other Ambulatory Visit (HOSPITAL_COMMUNITY): Payer: PPO

## 2021-09-28 ENCOUNTER — Encounter (HOSPITAL_COMMUNITY): Payer: Self-pay | Admitting: Orthopedic Surgery

## 2021-09-28 LAB — ACID FAST SMEAR (AFB, MYCOBACTERIA): Acid Fast Smear: NEGATIVE

## 2021-09-29 LAB — ACID FAST SMEAR (AFB, MYCOBACTERIA): Acid Fast Smear: NEGATIVE

## 2021-09-29 LAB — AEROBIC/ANAEROBIC CULTURE W GRAM STAIN (SURGICAL/DEEP WOUND)
Culture: NO GROWTH
Culture: NO GROWTH
Gram Stain: NONE SEEN
Gram Stain: NONE SEEN

## 2021-10-01 DIAGNOSIS — G5622 Lesion of ulnar nerve, left upper limb: Secondary | ICD-10-CM | POA: Diagnosis not present

## 2021-10-01 DIAGNOSIS — G5602 Carpal tunnel syndrome, left upper limb: Secondary | ICD-10-CM | POA: Diagnosis not present

## 2021-10-06 DIAGNOSIS — Z4789 Encounter for other orthopedic aftercare: Secondary | ICD-10-CM | POA: Diagnosis not present

## 2021-10-06 DIAGNOSIS — G5602 Carpal tunnel syndrome, left upper limb: Secondary | ICD-10-CM | POA: Diagnosis not present

## 2021-10-06 DIAGNOSIS — G5622 Lesion of ulnar nerve, left upper limb: Secondary | ICD-10-CM | POA: Diagnosis not present

## 2021-10-22 DIAGNOSIS — G5622 Lesion of ulnar nerve, left upper limb: Secondary | ICD-10-CM | POA: Diagnosis not present

## 2021-10-22 DIAGNOSIS — G5602 Carpal tunnel syndrome, left upper limb: Secondary | ICD-10-CM | POA: Diagnosis not present

## 2021-10-26 LAB — FUNGUS CULTURE WITH STAIN

## 2021-10-26 LAB — FUNGAL ORGANISM REFLEX

## 2021-10-26 LAB — FUNGUS CULTURE RESULT

## 2021-10-28 LAB — FUNGUS CULTURE RESULT

## 2021-10-28 LAB — FUNGUS CULTURE WITH STAIN

## 2021-10-28 LAB — FUNGAL ORGANISM REFLEX

## 2021-11-13 LAB — ACID FAST CULTURE WITH REFLEXED SENSITIVITIES (MYCOBACTERIA)
Acid Fast Culture: NEGATIVE
Acid Fast Culture: NEGATIVE

## 2021-12-08 DIAGNOSIS — Z4789 Encounter for other orthopedic aftercare: Secondary | ICD-10-CM | POA: Diagnosis not present

## 2021-12-10 ENCOUNTER — Other Ambulatory Visit: Payer: Self-pay

## 2021-12-10 ENCOUNTER — Ambulatory Visit (INDEPENDENT_AMBULATORY_CARE_PROVIDER_SITE_OTHER): Payer: PPO | Admitting: Family Medicine

## 2021-12-10 ENCOUNTER — Encounter: Payer: Self-pay | Admitting: Family Medicine

## 2021-12-10 VITALS — BP 136/86 | HR 70 | Temp 97.2°F | Ht 72.0 in | Wt 261.4 lb

## 2021-12-10 DIAGNOSIS — M159 Polyosteoarthritis, unspecified: Secondary | ICD-10-CM

## 2021-12-10 DIAGNOSIS — R7303 Prediabetes: Secondary | ICD-10-CM | POA: Diagnosis not present

## 2021-12-10 DIAGNOSIS — R351 Nocturia: Secondary | ICD-10-CM | POA: Diagnosis not present

## 2021-12-10 DIAGNOSIS — I4892 Unspecified atrial flutter: Secondary | ICD-10-CM

## 2021-12-10 DIAGNOSIS — Z0001 Encounter for general adult medical examination with abnormal findings: Secondary | ICD-10-CM | POA: Diagnosis not present

## 2021-12-10 DIAGNOSIS — N400 Enlarged prostate without lower urinary tract symptoms: Secondary | ICD-10-CM

## 2021-12-10 DIAGNOSIS — I10 Essential (primary) hypertension: Secondary | ICD-10-CM | POA: Diagnosis not present

## 2021-12-10 DIAGNOSIS — M150 Primary generalized (osteo)arthritis: Secondary | ICD-10-CM

## 2021-12-10 DIAGNOSIS — E785 Hyperlipidemia, unspecified: Secondary | ICD-10-CM

## 2021-12-10 LAB — LIPID PANEL
Cholesterol: 110 mg/dL (ref 0–200)
HDL: 27.7 mg/dL — ABNORMAL LOW (ref >39.00)
LDL Cholesterol: 66 mg/dL (ref 0–99)
NonHDL: 82.62
Total CHOL/HDL Ratio: 4
Triglycerides: 83 mg/dL (ref 0.0–149.0)
VLDL: 16.6 mg/dL (ref 0.0–40.0)

## 2021-12-10 LAB — CBC
HCT: 46.6 % (ref 39.0–52.0)
Hemoglobin: 15.6 g/dL (ref 13.0–17.0)
MCHC: 33.6 g/dL (ref 30.0–36.0)
MCV: 87.9 fl (ref 78.0–100.0)
Platelets: 187 10*3/uL (ref 150.0–400.0)
RBC: 5.3 Mil/uL (ref 4.22–5.81)
RDW: 14.7 % (ref 11.5–15.5)
WBC: 5.8 10*3/uL (ref 4.0–10.5)

## 2021-12-10 LAB — COMPREHENSIVE METABOLIC PANEL
ALT: 22 U/L (ref 0–53)
AST: 24 U/L (ref 0–37)
Albumin: 4.5 g/dL (ref 3.5–5.2)
Alkaline Phosphatase: 73 U/L (ref 39–117)
BUN: 18 mg/dL (ref 6–23)
CO2: 27 mEq/L (ref 19–32)
Calcium: 9.5 mg/dL (ref 8.4–10.5)
Chloride: 102 mEq/L (ref 96–112)
Creatinine, Ser: 0.85 mg/dL (ref 0.40–1.50)
GFR: 83.17 mL/min (ref >60.00)
Glucose, Bld: 124 mg/dL — ABNORMAL HIGH (ref 70–99)
Potassium: 4.6 mEq/L (ref 3.5–5.1)
Sodium: 137 mEq/L (ref 135–145)
Total Bilirubin: 0.5 mg/dL (ref 0.2–1.2)
Total Protein: 6.6 g/dL (ref 6.0–8.3)

## 2021-12-10 LAB — TSH: TSH: 2.4 u[IU]/mL (ref 0.35–5.50)

## 2021-12-10 LAB — HEMOGLOBIN A1C: Hgb A1c MFr Bld: 6.8 % — ABNORMAL HIGH (ref 4.6–6.5)

## 2021-12-10 LAB — PSA: PSA: 1.03 ng/mL (ref 0.10–4.00)

## 2021-12-10 NOTE — Assessment & Plan Note (Signed)
Continue management per orthopedics. 

## 2021-12-10 NOTE — Assessment & Plan Note (Signed)
Symptoms overall stable.  We will check PSA today.

## 2021-12-10 NOTE — Assessment & Plan Note (Signed)
At goal.  Check labs.  Continue lisinopril 10 mg daily.

## 2021-12-10 NOTE — Progress Notes (Signed)
Chief Complaint:  Troy Garrison is a 81 y.o. male who presents today for his annual comprehensive physical exam.    Assessment/Plan:  New/Acute Problems: Urinary Frequency Possible UTI.  He treated at home with leftover antibiotics.  No longer having symptoms.  We will check urine culture today to rule out persistent UTI.  Chronic Problems Addressed Today: Prediabetes Check A1c.  NOCTURIA Due to BPH.  Check urine culture.  Osteoarthritis Continue management per orthopedics.  BPH (benign prostatic hyperplasia) Symptoms overall stable.  We will check PSA today.  Hyperlipidemia On pravastatin 20 mg daily.  Check lipids.  HTN (hypertension) At goal.  Check labs.  Continue lisinopril 10 mg daily.  Atrial flutter (HCC) Regular rate and rhythm.  Check labs.  Preventative Healthcare: Check labs.  Not candidate for vaccines due to history of Guillain-Barr.  Patient Counseling(The following topics were reviewed and/or handout was given):  -Nutrition: Stressed importance of moderation in sodium/caffeine intake, saturated fat and cholesterol, caloric balance, sufficient intake of fresh fruits, vegetables, and fiber.  -Stressed the importance of regular exercise.   -Substance Abuse: Discussed cessation/primary prevention of tobacco, alcohol, or other drug use; driving or other dangerous activities under the influence; availability of treatment for abuse.   -Injury prevention: Discussed safety belts, safety helmets, smoke detector, smoking near bedding or upholstery.   -Sexuality: Discussed sexually transmitted diseases, partner selection, use of condoms, avoidance of unintended pregnancy and contraceptive alternatives.   -Dental health: Discussed importance of regular tooth brushing, flossing, and dental visits.  -Health maintenance and immunizations reviewed. Please refer to Health maintenance section.  Return to care in 1 year for next preventative visit.     Subjective:   HPI:  He has no acute complaints today.   Lifestyle Diet: Balanced.  Exercise: Limited due to recent orthopedic issues.   Depression screen PHQ 2/9 07/27/2021  Decreased Interest 0  Down, Depressed, Hopeless 0  PHQ - 2 Score 0    Health Maintenance Due  Topic Date Due   Hepatitis C Screening  Never done     ROS: Per HPI, otherwise a complete review of systems was negative.   PMH:  The following were reviewed and entered/updated in epic: Past Medical History:  Diagnosis Date   Atrial flutter ([place])    S/P RFCA with Dr. Caryl Comes 6/12   Bursitis of shoulder    Dysrhythmia    GERD (gastroesophageal reflux disease)    Hearing loss, neural    Left ear   Hemorrhoids    History of Guillain-Barre syndrome 2003   Hyperlipidemia    Hypertension    Injury of tendon of long head of biceps    Left axis deviation 1999   Osteoarthritis    Peptic ulcer 1964   with bleed ; transfused   Prediabetes 07/02/2008   Skin cancer    Patient Active Problem List   Diagnosis Date Noted   H/O total shoulder replacement, left 08/13/2021   Allergic rhinitis 04/28/2014   HTN (hypertension) 08/25/2011   Atrial flutter ([place]) 02/09/2011   BPH (benign prostatic hyperplasia) 07/08/2009   SKIN CANCER, HX OF 07/08/2009   Hyperlipidemia 07/02/2008   GERD 07/02/2008   NOCTURIA 07/02/2008   Prediabetes 07/02/2008   GUILLAIN-BARRE SYNDROME 03/01/2007   Osteoarthritis 03/01/2007   Past Surgical History:  Procedure Laterality Date   ABLATION OF DYSRHYTHMIC FOCUS  2012    for AF;Dr Caryl Comes   colonoscopy with polypectomy  2011   Internal hemorrhoids; [facility]   INGUINAL HERNIA  REPAIR  07/16/2020   Procedure: DIAGNOSTIC LAPAROSCOPY, LAPAROSCOPIC ABDOMINAL WALL HERNIA REPAIR WITH MESH;  Surgeon: Alphonsa Overall, MD;  Location: [facility];  Service: General;;   INTRAOCULAR LENS INSERTION     MELANOMA EXCISION     on scalp January 2019   REPLACEMENT TOTAL KNEE  2000   Left Knee   REVERSE SHOULDER  ARTHROPLASTY Left 08/13/2021   Procedure: REVERSE SHOULDER ARTHROPLASTY;  Surgeon: Netta Cedars, MD;  Location: [facility];  Service: Orthopedics;  Laterality: Left;  with ISB   REVISION TOTAL SHOULDER TO REVERSE TOTAL SHOULDER Left 09/24/2021   Procedure: Left shoulder revision reverse total shoulder arthroplasty with poly exchange;  Surgeon: Netta Cedars, MD;  Location: [facility];  Service: Orthopedics;  Laterality: Left;   TOTAL KNEE ARTHROPLASTY  09/21/2011   Procedure: TOTAL KNEE ARTHROPLASTY;  Surgeon: Tobi Bastos;  Location: [facility];  Service: Orthopedics;  Laterality: Right;   VASECTOMY     Scar tissue removed 2 years after the vasectomy    Family History  Problem Relation Age of Onset   Heart attack Father 10   Heart failure Father        Died @ 67   Lupus Mother    Heart attack Brother 75       MI @ 69;.CABG @ 33   Diabetes Brother    Prostate cancer Paternal Grandfather        90   Cancer Maternal Uncle        X2, unknown primary   Stroke Maternal Grandmother        > 65   Peripheral vascular disease Brother        S/P leg amputation   Colon cancer Neg Hx    Stomach cancer Neg Hx     Medications- reviewed and updated Current Outpatient Medications  Medication Sig Dispense Refill   aspirin 325 MG EC tablet Take 325 mg by mouth every other day.     esomeprazole (NEXIUM) 40 MG capsule Take 1 capsule (40 mg total) by mouth daily as needed. 90 capsule 3   ibuprofen (ADVIL,MOTRIN) 200 MG tablet Take 200-400 mg by mouth every 8 (eight) hours as needed for moderate pain.     lisinopril (ZESTRIL) 10 MG tablet Take 1 tablet (10 mg total) by mouth daily. 90 tablet 3   Melatonin 12 MG TABS Take 12 mg by mouth at bedtime.     Multiple Vitamin (MULTIVITAMIN) tablet Take 1 tablet by mouth daily.     Omega-3 Fatty Acids (FISH OIL) 1200 MG CAPS Take 1,200 mg by mouth daily.     pravastatin (PRAVACHOL) 40 MG tablet Take 0.5 tablets (20 mg total) by mouth daily. 45 tablet 1   No  current facility-administered medications for this visit.    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   Drug use: No   Sexual activity: Not on file  Other Topics Concern   Not on file  Social History Narrative   10 grandchildren    Social Determinants of Health   Financial Resource Strain: Not on file  Food Insecurity: Not on file  Transportation Needs: Not on file  Physical Activity: Not on file  Stress: Not on file  Social Connections: Not on file        Objective:  Physical Exam: BP 136/86 (BP Location: Left Arm)    Pulse 70    Temp (!) 97.2 F (36.2 C) (Temporal)    Ht 6' (1.829 m)    Wt 261 lb 6.4 oz (118.6 kg)    SpO2 96%    BMI 35.45 kg/m   Body mass index is 35.45 kg/m. Wt Readings from Last 3 Encounters:  12/10/21 261 lb 6.4 oz (118.6 kg)  09/24/21 260 lb (117.9 kg)  08/13/21 261 lb (118.4 kg)   Gen: NAD, resting comfortably HEENT: TMs normal bilaterally. OP clear. No thyromegaly noted.  CV: RRR with no murmurs appreciated Pulm: NWOB, CTAB with no crackles, wheezes, or rhonchi GI: Normal bowel sounds present. Soft, Nontender, Nondistended. MSK: no edema, cyanosis, or clubbing noted Skin: warm, dry Neuro: CN2-12 grossly intact. Strength 5/5 in upper and lower extremities. Reflexes symmetric and intact bilaterally.  Psych: Normal affect and thought content     Sheelah Ritacco  M. [name], MD 12/10/2021 10:25 AM

## 2021-12-10 NOTE — Assessment & Plan Note (Signed)
Regular rate and rhythm.  Check labs.

## 2021-12-10 NOTE — Patient Instructions (Signed)
It was very nice to see you today!  We will check blood work today.  We will check a urine sample.  Please continue work on diet and exercise.  I will see back in a year.  Come back to see me sooner if needed.  Take care, Dr [name]  PLEASE NOTE:  If you had any lab tests please let us know if you have not heard back within a few days. You may see your results on mychart before we have a chance to review them but we will give you a call once they are reviewed by [place]. If we ordered any referrals today, please let us know if you have not heard from their office within the next week.   Please try these tips to maintain a healthy lifestyle:  Eat at least 3 REAL meals and 1-2 snacks per day.  Aim for no more than 5 hours between eating.  If you eat breakfast, please do so within one hour of getting up.   Each meal should contain half fruits/vegetables, one quarter protein, and one quarter carbs (no bigger than a computer mouse)  Cut down on sweet beverages. This includes juice, soda, and sweet tea.   Drink at least 1 glass of water with each meal and aim for at least 8 glasses per day  Exercise at least 150 minutes every week.    Preventive Care 39 Years and Older, Male Preventive care refers to lifestyle choices and visits with your health care provider that can promote health and wellness. Preventive care visits are also called wellness exams. What can I expect for my preventive care visit? Counseling During your preventive care visit, your health care provider may ask about your: Medical history, including: Past medical problems. Family medical history. History of falls. Current health, including: Emotional well-being. Home life and relationship well-being. Sexual activity. Memory and ability to understand (cognition). Lifestyle, including: Alcohol, nicotine or tobacco, and drug use. Access to firearms. Diet, exercise, and sleep habits. Work and work Statistician. Sunscreen  use. Safety issues such as seatbelt and bike helmet use. Physical exam Your health care provider will check your: Height and weight. These may be used to calculate your BMI (body mass index). BMI is a measurement that tells if you are at a healthy weight. Waist circumference. This measures the distance around your waistline. This measurement also tells if you are at a healthy weight and may help predict your risk of certain diseases, such as type 2 diabetes and high blood pressure. Heart rate and blood pressure. Body temperature. Skin for abnormal spots. What immunizations do I need? Vaccines are usually given at various ages, according to a schedule. Your health care provider will recommend vaccines for you based on your age, medical history, and lifestyle or other factors, such as travel or where you work. What tests do I need? Screening Your health care provider may recommend screening tests for certain conditions. This may include: Lipid and cholesterol levels. Diabetes screening. This is done by checking your blood sugar (glucose) after you have not eaten for a while (fasting). Hepatitis C test. Hepatitis B test. HIV (human immunodeficiency virus) test. STI (sexually transmitted infection) testing, if you are at risk. Lung cancer screening. Colorectal cancer screening. Prostate cancer screening. Abdominal aortic aneurysm (AAA) screening. You may need this if you are a current or former smoker. Talk with your health care provider about your test results, treatment options, and if necessary, the need for more tests. Follow these  instructions at home: Eating and drinking  Eat a diet that includes fresh fruits and vegetables, whole grains, lean protein, and low-fat dairy products. Limit your intake of foods with high amounts of sugar, saturated fats, and salt. Take vitamin and mineral supplements as recommended by your health care provider. Do not drink alcohol if your health care  provider tells you not to drink. If you drink alcohol: Limit how much you have to 0-2 drinks a day. Know how much alcohol is in your drink. In the [place] one drink equals one 12 oz bottle of beer (355 mL), one 5 oz glass of wine (148 mL), or one 1 oz glass of hard liquor (44 mL). Lifestyle Brush your teeth every morning and night with fluoride toothpaste. Floss one time each day. Exercise for at least 30 minutes 5 or more days each week. Do not use any products that contain nicotine or tobacco. These products include cigarettes, chewing tobacco, and vaping devices, such as e-cigarettes. If you need help quitting, ask your health care provider. Do not use drugs. If you are sexually active, practice safe sex. Use a condom or other form of protection to prevent STIs. Take aspirin only as told by your health care provider. Make sure that you understand how much to take and what form to take. Work with your health care provider to find out whether it is safe and beneficial for you to take aspirin daily. Ask your health care provider if you need to take a cholesterol-lowering medicine (statin). Find healthy ways to manage stress, such as: Meditation, yoga, or listening to music. Journaling. Talking to a trusted person. Spending time with friends and family. Safety Always wear your seat belt while driving or riding in a vehicle. Do not drive: If you have been drinking alcohol. Do not ride with someone who has been drinking. When you are tired or distracted. While texting. If you have been using any mind-altering substances or drugs. Wear a helmet and other protective equipment during sports activities. If you have firearms in your house, make sure you follow all gun safety procedures. Minimize exposure to UV radiation to reduce your risk of skin cancer. What's next? Visit your health care provider once a year for an annual wellness visit. Ask your health care provider how often you should have  your eyes and teeth checked. Stay up to date on all vaccines. This information is not intended to replace advice given to you by your health care provider. Make sure you discuss any questions you have with your health care provider. Document Revised: 04/07/2021 Document Reviewed: 04/07/2021 Elsevier Patient Education  [place].

## 2021-12-10 NOTE — Assessment & Plan Note (Signed)
Due to BPH.  Check urine culture.

## 2021-12-10 NOTE — Assessment & Plan Note (Signed)
Check A1c. 

## 2021-12-10 NOTE — Assessment & Plan Note (Signed)
On pravastatin 20 mg daily.  Check lipids.

## 2021-12-11 LAB — URINE CULTURE
MICRO NUMBER:: 13024146
SPECIMEN QUALITY:: ADEQUATE

## 2021-12-13 NOTE — Progress Notes (Signed)
Please inform patient of the following:  Labs are all stable. Do not need to make any changes to my treatment plan at this time. Would like for him to keep up the good work and we can recheck in 6 months.  Algis Greenhouse. [name], MD 12/13/2021 10:27 AM

## 2021-12-15 DIAGNOSIS — G5622 Lesion of ulnar nerve, left upper limb: Secondary | ICD-10-CM | POA: Diagnosis not present

## 2021-12-15 DIAGNOSIS — G5602 Carpal tunnel syndrome, left upper limb: Secondary | ICD-10-CM | POA: Diagnosis not present

## 2021-12-17 ENCOUNTER — Encounter: Payer: PPO | Admitting: Family Medicine

## 2021-12-20 ENCOUNTER — Encounter: Payer: PPO | Admitting: Family Medicine

## 2021-12-30 DIAGNOSIS — M25622 Stiffness of left elbow, not elsewhere classified: Secondary | ICD-10-CM | POA: Diagnosis not present

## 2022-01-06 DIAGNOSIS — M25642 Stiffness of left hand, not elsewhere classified: Secondary | ICD-10-CM | POA: Diagnosis not present

## 2022-01-06 DIAGNOSIS — M25622 Stiffness of left elbow, not elsewhere classified: Secondary | ICD-10-CM | POA: Diagnosis not present

## 2022-01-13 DIAGNOSIS — M25642 Stiffness of left hand, not elsewhere classified: Secondary | ICD-10-CM | POA: Diagnosis not present

## 2022-01-13 DIAGNOSIS — M25622 Stiffness of left elbow, not elsewhere classified: Secondary | ICD-10-CM | POA: Diagnosis not present

## 2022-02-04 DIAGNOSIS — Z96651 Presence of right artificial knee joint: Secondary | ICD-10-CM | POA: Diagnosis not present

## 2022-02-24 DIAGNOSIS — L570 Actinic keratosis: Secondary | ICD-10-CM | POA: Diagnosis not present

## 2022-02-24 DIAGNOSIS — D225 Melanocytic nevi of trunk: Secondary | ICD-10-CM | POA: Diagnosis not present

## 2022-02-24 DIAGNOSIS — Z85828 Personal history of other malignant neoplasm of skin: Secondary | ICD-10-CM | POA: Diagnosis not present

## 2022-02-24 DIAGNOSIS — L821 Other seborrheic keratosis: Secondary | ICD-10-CM | POA: Diagnosis not present

## 2022-02-24 DIAGNOSIS — D2271 Melanocytic nevi of right lower limb, including hip: Secondary | ICD-10-CM | POA: Diagnosis not present

## 2022-02-24 DIAGNOSIS — D2272 Melanocytic nevi of left lower limb, including hip: Secondary | ICD-10-CM | POA: Diagnosis not present

## 2022-02-24 DIAGNOSIS — Z85820 Personal history of malignant melanoma of skin: Secondary | ICD-10-CM | POA: Diagnosis not present

## 2022-03-16 ENCOUNTER — Other Ambulatory Visit: Payer: Self-pay | Admitting: *Deleted

## 2022-03-16 MED ORDER — PRAVASTATIN SODIUM 40 MG PO TABS: 20.0000 mg | ORAL_TABLET | Freq: Every day | ORAL | 1 refills | Status: DC

## 2022-04-23 IMAGING — DX DG SHOULDER 1V*L*
1 series · 1 of 1 positions shown · non-contrast
Comparison: None.

CLINICAL DATA: Left shoulder replacement

EXAM:
LEFT SHOULDER

[shoulder ap]
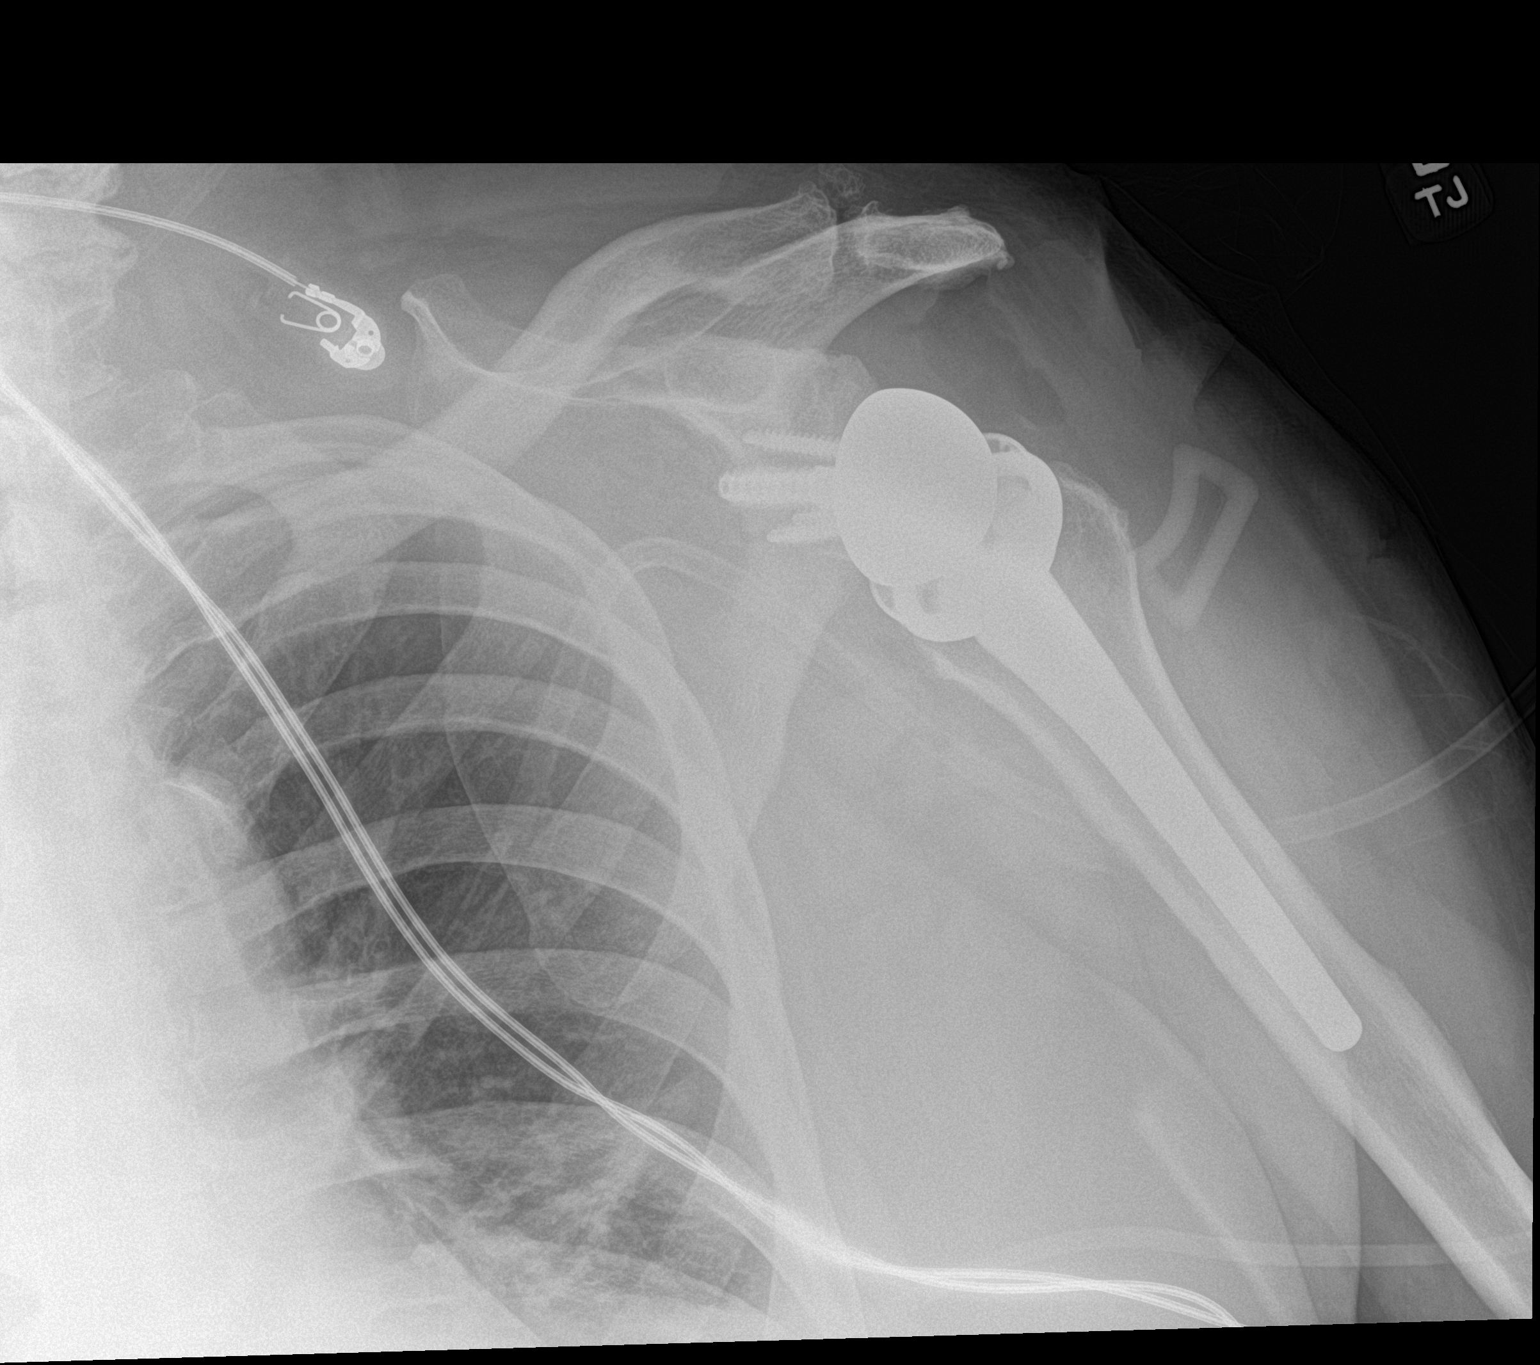

[1 of 1 positions shown; findings below may reference images not displayed]

FINDINGS: Left shoulder replacement in satisfactory position alignment. No
fracture or complication. Degenerative change in the AC joint.
IMPRESSION: Satisfactory left shoulder replacement.

## 2022-06-02 DIAGNOSIS — M25519 Pain in unspecified shoulder: Secondary | ICD-10-CM | POA: Diagnosis not present

## 2022-06-02 DIAGNOSIS — M25511 Pain in right shoulder: Secondary | ICD-10-CM | POA: Diagnosis not present

## 2022-06-08 ENCOUNTER — Other Ambulatory Visit: Payer: Self-pay | Admitting: Orthopedic Surgery

## 2022-06-08 DIAGNOSIS — M19112 Post-traumatic osteoarthritis, left shoulder: Secondary | ICD-10-CM | POA: Diagnosis not present

## 2022-06-08 DIAGNOSIS — M25511 Pain in right shoulder: Secondary | ICD-10-CM | POA: Diagnosis not present

## 2022-06-08 DIAGNOSIS — M25512 Pain in left shoulder: Secondary | ICD-10-CM | POA: Diagnosis not present

## 2022-06-08 DIAGNOSIS — M75121 Complete rotator cuff tear or rupture of right shoulder, not specified as traumatic: Secondary | ICD-10-CM | POA: Diagnosis not present

## 2022-06-08 DIAGNOSIS — Z96612 Presence of left artificial shoulder joint: Secondary | ICD-10-CM | POA: Diagnosis not present

## 2022-06-10 DIAGNOSIS — G5602 Carpal tunnel syndrome, left upper limb: Secondary | ICD-10-CM | POA: Diagnosis not present

## 2022-06-10 DIAGNOSIS — G5622 Lesion of ulnar nerve, left upper limb: Secondary | ICD-10-CM | POA: Diagnosis not present

## 2022-06-16 ENCOUNTER — Telehealth: Payer: Self-pay | Admitting: Family Medicine

## 2022-06-16 NOTE — Telephone Encounter (Signed)
Pt states: -[facility] sent a surgery clearance. -Specialty is holding an appointment and needs the clearance to be sent asap.   Pt asks: -Can clearance be sent without an ov because of time sensitive nature.    Fax received and placed in providers box.  Pt scheduled for 08/25 for clearance.

## 2022-06-17 ENCOUNTER — Encounter: Payer: Self-pay | Admitting: Family Medicine

## 2022-06-17 ENCOUNTER — Ambulatory Visit (INDEPENDENT_AMBULATORY_CARE_PROVIDER_SITE_OTHER): Payer: PPO | Admitting: Family Medicine

## 2022-06-17 VITALS — BP 160/81 | HR 64 | Temp 97.4°F | Ht 72.0 in | Wt 268.2 lb

## 2022-06-17 DIAGNOSIS — I10 Essential (primary) hypertension: Secondary | ICD-10-CM

## 2022-06-17 DIAGNOSIS — Z125 Encounter for screening for malignant neoplasm of prostate: Secondary | ICD-10-CM

## 2022-06-17 DIAGNOSIS — M150 Primary generalized (osteo)arthritis: Secondary | ICD-10-CM

## 2022-06-17 DIAGNOSIS — E785 Hyperlipidemia, unspecified: Secondary | ICD-10-CM

## 2022-06-17 DIAGNOSIS — M159 Polyosteoarthritis, unspecified: Secondary | ICD-10-CM

## 2022-06-17 DIAGNOSIS — Z01818 Encounter for other preprocedural examination: Secondary | ICD-10-CM

## 2022-06-17 DIAGNOSIS — R7303 Prediabetes: Secondary | ICD-10-CM | POA: Diagnosis not present

## 2022-06-17 DIAGNOSIS — R351 Nocturia: Secondary | ICD-10-CM

## 2022-06-17 LAB — PSA: PSA: 1.39 ng/mL (ref 0.10–4.00)

## 2022-06-17 LAB — CBC
HCT: 46.8 % (ref 39.0–52.0)
Hemoglobin: 15.7 g/dL (ref 13.0–17.0)
MCHC: 33.5 g/dL (ref 30.0–36.0)
MCV: 92 fl (ref 78.0–100.0)
Platelets: 201 10*3/uL (ref 150.0–400.0)
RBC: 5.09 Mil/uL (ref 4.22–5.81)
RDW: 14.3 % (ref 11.5–15.5)
WBC: 7.7 10*3/uL (ref 4.0–10.5)

## 2022-06-17 LAB — COMPREHENSIVE METABOLIC PANEL
ALT: 24 U/L (ref 0–53)
AST: 21 U/L (ref 0–37)
Albumin: 4.5 g/dL (ref 3.5–5.2)
Alkaline Phosphatase: 77 U/L (ref 39–117)
BUN: 16 mg/dL (ref 6–23)
CO2: 26 mEq/L (ref 19–32)
Calcium: 9.4 mg/dL (ref 8.4–10.5)
Chloride: 98 mEq/L (ref 96–112)
Creatinine, Ser: 0.89 mg/dL (ref 0.40–1.50)
GFR: 81.73 mL/min (ref >60.00)
Glucose, Bld: 136 mg/dL — ABNORMAL HIGH (ref 70–99)
Potassium: 5.3 mEq/L — ABNORMAL HIGH (ref 3.5–5.1)
Sodium: 133 mEq/L — ABNORMAL LOW (ref 135–145)
Total Bilirubin: 0.7 mg/dL (ref 0.2–1.2)
Total Protein: 6.8 g/dL (ref 6.0–8.3)

## 2022-06-17 LAB — TSH: TSH: 2.29 u[IU]/mL (ref 0.35–5.50)

## 2022-06-17 LAB — HEMOGLOBIN A1C: Hgb A1c MFr Bld: 7 % — ABNORMAL HIGH (ref 4.6–6.5)

## 2022-06-17 NOTE — Progress Notes (Signed)
   Troy Garrison is a 81 y.o. male who presents today for an office visit.  Assessment/Plan:  New/Acute Problems: Preop evaluation Patient overall low risk for complication from surgery.  He has excellent functional status.  EKG today is stable compared to EKG from last year.  We will check preoperative labs today.  We will fax surgical clearance form to requesting surgeon's office.  Chronic Problems Addressed Today: HTN (hypertension) Elevated today.  He has typically been well controlled.  In light of his upcoming surgery we will be slightly more aggressive about maintaining good blood pressure control.  We will increase his lisinopril to 20 mg daily.  He will continue to monitor at home and check with [place] in a few weeks in [place] [address].  Goal blood pressure 140/90 or lower.  Hyperlipidemia On pravastatin 20 mg daily.  Tolerating well.  Prediabetes Check A1c.  NOCTURIA Check PSA.  Osteoarthritis Continue management per orthopedics.  Has upcoming right shoulder replacement.     Subjective:  HPI:  Patient here today for preop evaluation for shoulder surgery at the request of Dr Veverly Fells for right shoulder replacement.  He had a left shoulder replacement about a year ago.  He did well with this though had some lingering nerve damage that is still persisted.  He does have a longstanding history of arthritis.  He has had bilateral knee replacement as well.  His symptoms have not improved with conservative management in his right shoulder.  Does not have any exertional chest pain or shortness of breath.  He has excellent functional status.       Objective:  Physical Exam: BP (!) 160/81   Pulse 64   Temp (!) 97.4 F (36.3 C) (Temporal)   Ht 6' (1.829 m)   Wt 268 lb 3.2 oz (121.7 kg)   SpO2 100%   BMI 36.37 kg/m   Gen: No acute distress, resting comfortably CV: Regular rate and rhythm with no murmurs appreciated Pulm: Normal work of breathing, clear to auscultation bilaterally with  no crackles, wheezes, or rhonchi Neuro: Grossly normal, moves all extremities Psych: Normal affect and thought content  EKG: Normal sinus rhythm.  Right bundle branch block stable compared to previous EKGs.  No signs of acute ischemia.      Algis Greenhouse. [name], MD 06/17/2022 10:23 AM

## 2022-06-17 NOTE — Patient Instructions (Signed)
It was very nice to see you today!  We will check blood work today.  I believe you will do great with your surgery.  We will fax surgical clearance form over to your surgeon's office today.  Take care, Dr [name]  PLEASE NOTE:  If you had any lab tests please let us know if you have not heard back within a few days. You may see your results on mychart before we have a chance to review them but we will give you a call once they are reviewed by [place]. If we ordered any referrals today, please let us know if you have not heard from their office within the next week.   Please try these tips to maintain a healthy lifestyle:  Eat at least 3 REAL meals and 1-2 snacks per day.  Aim for no more than 5 hours between eating.  If you eat breakfast, please do so within one hour of getting up.   Each meal should contain half fruits/vegetables, one quarter protein, and one quarter carbs (no bigger than a computer mouse)  Cut down on sweet beverages. This includes juice, soda, and sweet tea.   Drink at least 1 glass of water with each meal and aim for at least 8 glasses per day  Exercise at least 150 minutes every week.

## 2022-06-17 NOTE — Telephone Encounter (Signed)
Patient need OV, has OV today

## 2022-06-17 NOTE — Assessment & Plan Note (Signed)
On pravastatin 20 mg daily.  Tolerating well.

## 2022-06-17 NOTE — Assessment & Plan Note (Signed)
Continue management per orthopedics.  Has upcoming right shoulder replacement.

## 2022-06-17 NOTE — Assessment & Plan Note (Signed)
Check PSA. ?

## 2022-06-17 NOTE — Assessment & Plan Note (Signed)
Check A1c. 

## 2022-06-17 NOTE — Assessment & Plan Note (Signed)
Elevated today.  He has typically been well controlled.  In light of his upcoming surgery we will be slightly more aggressive about maintaining good blood pressure control.  We will increase his lisinopril to 20 mg daily.  He will continue to monitor at home and check with [place] in a few weeks in [place].  Goal blood pressure 140/90 or lower.

## 2022-06-20 ENCOUNTER — Ambulatory Visit
Admission: RE | Admit: 2022-06-20 | Discharge: 2022-06-20 | Disposition: A | Discharge status: Home / Self Care | Payer: PPO | Source: Ambulatory Visit | Attending: Orthopedic Surgery | Admitting: Orthopedic Surgery

## 2022-06-20 DIAGNOSIS — M25511 Pain in right shoulder: Secondary | ICD-10-CM | POA: Diagnosis not present

## 2022-06-20 DIAGNOSIS — M75121 Complete rotator cuff tear or rupture of right shoulder, not specified as traumatic: Secondary | ICD-10-CM

## 2022-06-21 ENCOUNTER — Telehealth: Payer: Self-pay | Admitting: *Deleted

## 2022-06-21 NOTE — Addendum Note (Signed)
Addended by: Loura Back on: 06/21/2022 02:34 PM   Modules accepted: Orders

## 2022-06-21 NOTE — Telephone Encounter (Signed)
Spoke with patient, informed patient per Lab tech PTT was not run at the lab  Need to schedule OV for re do lab, patient stated unable to come for labs. Will do at hospital with pre op procedure

## 2022-06-22 ENCOUNTER — Other Ambulatory Visit: Payer: Self-pay

## 2022-06-22 NOTE — Patient Outreach (Signed)
  Care Coordination   Initial Visit Note   06/22/2022 Name: Troy Garrison MRN: [id number] DOB: Feb 28, 1941  Troy Garrison is a 81 y.o. year old male who sees Vivi Barrack, MD for primary care. I spoke with  Christin Bach by phone today.  What matters to the patients health and wellness today?  No concerns today    Goals Addressed             This Visit's Progress    COMPLETED: Care Coordination Activities - no follow up required       Care Coordination Interventions: Advised patient to call to schedule Annual Wellness Visit  Provided education to patient re: Annual Wellness Visit, care coordination services Assessed social determinant of health barriers          SDOH assessments and interventions completed:  Yes  SDOH Interventions Today    Flowsheet Row Most Recent Value  SDOH Interventions   Food Insecurity Interventions Intervention Not Indicated  Housing Interventions Intervention Not Indicated  Transportation Interventions Intervention Not Indicated        Care Coordination Interventions Activated:  Yes  Care Coordination Interventions:  Yes, provided   Follow up plan: No further intervention required.   Encounter Outcome:  Pt. Visit Completed  Peter Garter RN, BSN,CCM, CDE Care Management Coordinator [facility] [phone]

## 2022-06-22 NOTE — Patient Instructions (Signed)
Visit Information  Thank you for taking time to visit with me today. Please don't hesitate to contact me if I can be of assistance to you.   Following are the goals we discussed today:   Goals Addressed             This Visit's Progress    COMPLETED: Care Coordination Activities - no follow up required       Care Coordination Interventions: Advised patient to call to schedule Annual Wellness Visit Provided education to patient re: Annual Wellness Visit, care coordination services Assessed social determinant of health barriers          If you are experiencing a Mental Health or Behavioral Health Crisis or need someone to talk to, please call the Suicide and Crisis Lifeline: 988 call the [place] National Suicide Prevention Lifeline: 1-800-273-8255 or TTY: 1-800-799-4 TTY (1-800-799-4889) to talk to a trained counselor call 1-800-273-TALK (toll free, 24 hour hotline) go to [facility] [address], [place] ([phone]) call 911   Patient verbalizes understanding of instructions and care plan provided today and agrees to view in MyChart. Active MyChart status and patient understanding of how to access instructions and care plan via MyChart confirmed with patient.     No further follow up required:    Isaiah Torok RN, BSN,CCM, CDE Care Management Coordinator [facility] [phone]     

## 2022-06-22 NOTE — Progress Notes (Signed)
Please inform patient of the following:  Labs are all stable.  His blood sugar is borderline elevated but stable.  Do not need to make any changes to his treatment plan at this time.  We can recheck in a year.

## 2022-06-28 NOTE — Progress Notes (Signed)
Sent message, via epic in basket, requesting orders in epic from surgeon.  

## 2022-06-30 NOTE — H&P (Signed)
Patient's anticipated LOS is less than 2 midnights, meeting these requirements: - Younger than 83 - Lives within 1 hour of care - Has a competent adult at home to recover with post-op recover - NO history of  - Chronic pain requiring opiods  - Diabetes  - Coronary Artery Disease  - Heart failure  - Heart attack  - Stroke  - DVT/VTE  - Cardiac arrhythmia  - Respiratory Failure/COPD  - Renal failure  - Anemia  - Advanced Liver disease     Troy Garrison is an 81 y.o. male.    Chief Complaint: right shoulder pain  HPI: Pt is a 81 y.o. male complaining of right shoulder pain for multiple years. Pain had continually increased since the beginning. X-rays in the clinic show end-stage arthritic changes of the right shoulder. Pt has tried various conservative treatments which have failed to alleviate their symptoms, including injections and therapy. Various options are discussed with the patient. Risks, benefits and expectations were discussed with the patient. Patient understand the risks, benefits and expectations and wishes to proceed with surgery.   PCP:  Vivi Barrack, MD  D/C Plans: Home  PMH: Past Medical History:  Diagnosis Date   Atrial flutter ([place])    S/P RFCA with Dr. Caryl Comes 6/12   Bursitis of shoulder    Dysrhythmia    GERD (gastroesophageal reflux disease)    Hearing loss, neural    Left ear   Hemorrhoids    History of Guillain-Barre syndrome 2003   Hyperlipidemia    Hypertension    Injury of tendon of long head of biceps    Left axis deviation 1999   Osteoarthritis    Peptic ulcer 1964   with bleed ; transfused   Prediabetes 07/02/2008   Skin cancer     PSH: Past Surgical History:  Procedure Laterality Date   ABLATION OF DYSRHYTHMIC FOCUS  2012    for AF;Dr Caryl Comes   colonoscopy with polypectomy  2011   Internal hemorrhoids; [facility]   INGUINAL HERNIA REPAIR  07/16/2020   Procedure: DIAGNOSTIC LAPAROSCOPY, LAPAROSCOPIC ABDOMINAL WALL HERNIA  REPAIR WITH MESH;  Surgeon: Alphonsa Overall, MD;  Location: [facility];  Service: General;;   INTRAOCULAR LENS INSERTION     MELANOMA EXCISION     on scalp January 2019   REPLACEMENT TOTAL KNEE  2000   Left Knee   REVERSE SHOULDER ARTHROPLASTY Left 08/13/2021   Procedure: REVERSE SHOULDER ARTHROPLASTY;  Surgeon: Netta Cedars, MD;  Location: [facility];  Service: Orthopedics;  Laterality: Left;  with ISB   REVISION TOTAL SHOULDER TO REVERSE TOTAL SHOULDER Left 09/24/2021   Procedure: Left shoulder revision reverse total shoulder arthroplasty with poly exchange;  Surgeon: Netta Cedars, MD;  Location: [facility];  Service: Orthopedics;  Laterality: Left;   TOTAL KNEE ARTHROPLASTY  09/21/2011   Procedure: TOTAL KNEE ARTHROPLASTY;  Surgeon: Tobi Bastos;  Location: [facility];  Service: Orthopedics;  Laterality: Right;   VASECTOMY     Scar tissue removed 2 years after the vasectomy    Social History:  reports that he quit smoking about 29 years ago. His smoking use included cigarettes. He has never used smokeless tobacco. He reports current alcohol use. He reports that he does not use drugs. BMI: Estimated body mass index is 36.37 kg/m as calculated from the following:   Height as of 06/17/22: 6' (1.829 m).   Weight as of 06/17/22: 121.7 kg.  Lab Results  Component Value Date   ALBUMIN  4.5 06/17/2022   Diabetes: Patient does not have a diagnosis of diabetes. Lab Results  Component Value Date   HGBA1C 7.0 (H) 06/17/2022     Smoking Status: Social History   Tobacco Use  Smoking Status Former   Types: Cigarettes   Quit date: 10/24/1992   Years since quitting: 29.7  Smokeless Tobacco Never  Tobacco Comments   smoked 1960-1994, up to < 1 ppd   The patient is not currently a tobacco user. Counseling given: Not Answered Tobacco comments: smoked 1960-1994, up to < 1 ppd     Allergies:  No Known Allergies  Medications: No current facility-administered medications for this encounter.    Current Outpatient Medications  Medication Sig Dispense Refill   aspirin 325 MG EC tablet Take 325 mg by mouth every other day.     esomeprazole (NEXIUM) 40 MG capsule Take 1 capsule (40 mg total) by mouth daily as needed. 90 capsule 3   ibuprofen (ADVIL,MOTRIN) 200 MG tablet Take 200-400 mg by mouth every 8 (eight) hours as needed for moderate pain.     lisinopril (ZESTRIL) 10 MG tablet Take 1 tablet (10 mg total) by mouth daily. 90 tablet 3   Melatonin 12 MG TABS Take 12 mg by mouth at bedtime.     Multiple Vitamin (MULTIVITAMIN) tablet Take 1 tablet by mouth daily.     Omega-3 Fatty Acids (FISH OIL) 1200 MG CAPS Take 1,200 mg by mouth daily.     pravastatin (PRAVACHOL) 40 MG tablet Take 0.5 tablets (20 mg total) by mouth daily. 45 tablet 1    No results found for this or any previous visit (from the past 48 hour(s)). No results found.  ROS: Pain with rom of the right upper extremity  Physical Exam: Alert and oriented 81 y.o. male in no acute distress Cranial nerves 2-12 intact Cervical spine: full rom with no tenderness, nv intact distally Chest: active breath sounds bilaterally, no wheeze rhonchi or rales Heart: regular rate and rhythm, no murmur Abd: non tender non distended with active bowel sounds Hip is stable with rom  Right shoulder painful and weak rom Nv intact distally No rashes or edema distally  Assessment/Plan Assessment: right shoulder cuff arthropathy  Plan:  Patient will undergo a right reverse total shoulder by Dr. Veverly Fells at [facility] Risks benefits and expectations were discussed with the patient. Patient understand risks, benefits and expectations and wishes to proceed. Preoperative templating of the joint replacement has been completed, documented, and submitted to the Operating Room personnel in order to optimize intra-operative equipment management.   Merla Riches PA-C, MPAS [facility] is now [employer] [address],  [place], [place], [place] [address] Phone: [phone] [email or web address] Facebook  [employer]

## 2022-07-02 NOTE — Patient Instructions (Signed)
SURGICAL WAITING ROOM VISITATION Patients having surgery or a procedure may have no more than 2 support people in the waiting area - these visitors may rotate in the visitor waiting room.   Children under the age of 5 must have an adult with them who is not the patient. If the patient needs to stay at the hospital during part of their recovery, the visitor guidelines for inpatient rooms apply.  PRE-OP VISITATION  Pre-op nurse will coordinate an appropriate time for 1 support person to accompany the patient in pre-op.  This support person may not rotate.  This visitor will be contacted when the time is appropriate for the visitor to come back in the pre-op area.  Please refer to the [facility] website for the visitor guidelines for Inpatients (after your surgery is over and you are in a regular room).  IF YOU WILL BE ADMITTED INTO THE HOSPITAL YOU ARE ALLOWED ONLY FOUR SUPPORT PEOPLE DURING VISITATION HOURS (7 AM -8PM)   The support person(s) must pass our screening, and use Hand sanitizing gel. Visitors GUEST BADGE MUST BE WORN VISIBLY  One adult visitor may remain with you overnight and MUST be in the room by 8 P.M.   You are not required to quarantine at this time prior to your surgery. However, you must do this: Hand Hygiene often Do NOT share personal items Notify your provider if you are in close contact with someone who has COVID or you develop fever 100.4 or greater, new onset of sneezing, cough, sore throat, shortness of breath or body aches.       Your procedure is scheduled on:  Friday  July 22, 2022  Report to [facility] Main Entrance.  Report to admitting at:  12:30  PM  +++++Call this number if you have any questions or problems the morning of surgery [phone]  Do not eat food :After Midnight the night prior to your surgery/procedure.  After Midnight you may have the following liquids until  12:00 Noon DAY OF SURGERY  Clear Liquid Diet Water Black  Coffee (sugar ok, NO MILK/CREAM OR CREAMERS)  Tea (sugar ok, NO MILK/CREAM OR CREAMERS) regular and decaf                             Plain Jell-O (NO RED)                                           Fruit ices (not with fruit pulp, NO RED)                                     Popsicles (NO RED)                                                                  Juice: apple, WHITE grape, WHITE cranberry Sports drinks like Gatorade (NO RED)                    The day of surgery:  Drink ONE (1) Pre-Surgery  G2 at  12:00  noon the day of surgery. Drink in one sitting. Do not sip.  This drink was given to you during your hospital pre-op appointment visit. Nothing else to drink after completing the Pre-Surgery  G2 : No candy, chewing or throat lozenges.    FOLLOW  ANY ADDITIONAL PRE OP INSTRUCTIONS YOU RECEIVED FROM YOUR SURGEON'S OFFICE!!!   Oral Hygiene is also important to reduce your risk of infection.        Remember - BRUSH YOUR TEETH THE MORNING OF SURGERY WITH YOUR REGULAR TOOTHPASTE  Do NOT smoke after Midnight the night before surgery.  Take ONLY these medicines the morning of surgery with A SIP OF WATER: no medications                   You may not have any metal on your body including  jewelry, and body piercing  Do not wear lotions, powders, cologne, or deodorant  Men may shave face and neck.  Contacts, Hearing Aids, dentures or bridgework may not be worn into surgery.   You may bring a small overnight bag with you on the day of surgery, only pack items that are not valuable .[facility] IS NOT RESPONSIBLE   FOR VALUABLES THAT ARE LOST OR STOLEN.   DO NOT [place]. PHARMACY WILL DISPENSE MEDICATIONS LISTED ON YOUR MEDICATION LIST TO YOU DURING YOUR ADMISSION [place]!   Special Instructions: Bring a copy of your healthcare power of attorney and living will documents the day of surgery, if you wish to have them scanned into your  [facility] Medical Records- EPIC  Please read over the following fact sheets you were given: IF YOU HAVE QUESTIONS ABOUT YOUR PRE-OP INSTRUCTIONS, PLEASE CALL [phone]  ([place])   [place] - Preparing for Surgery Before surgery, you can play an important role.  Because skin is not sterile, your skin needs to be as free of germs as possible.  You can reduce the number of germs on your skin by washing with CHG (chlorahexidine gluconate) soap before surgery.  CHG is an antiseptic cleaner which kills germs and bonds with the skin to continue killing germs even after washing. Please DO NOT use if you have an allergy to CHG or antibacterial soaps.  If your skin becomes reddened/irritated stop using the CHG and inform your nurse when you arrive at Short Stay. Do not shave (including legs and underarms) for at least 48 hours prior to the first CHG shower.  You may shave your face/neck.  Please follow these instructions carefully:  1.  Shower with CHG Soap the night before surgery and the  morning of surgery.  2.  If you choose to wash your hair, wash your hair first as usual with your normal  shampoo.  3.  After you shampoo, rinse your hair and body thoroughly to remove the shampoo.                             4.  Use CHG as you would any other liquid soap.  You can apply chg directly to the skin and wash.  Gently with a scrungie or clean washcloth.  5.  Apply the CHG Soap to your body ONLY FROM THE NECK DOWN.   Do not use on face/ open  Wound or open sores. Avoid contact with eyes, ears mouth and genitals (private parts).                       Wash face,  Genitals (private parts) with your normal soap.             6.  Wash thoroughly, paying special attention to the area where your  surgery  will be performed.  7.  Thoroughly rinse your body with warm water from the neck down.  8.  DO NOT shower/wash with your normal soap after using and rinsing off the CHG Soap.             9.  Pat yourself dry with a clean towel.            10.  Wear clean pajamas.            11.  Place clean sheets on your bed the night of your first shower and do not  sleep with pets.  ON THE DAY OF SURGERY : Do not apply any lotions/deodorants the morning of surgery.  Please wear clean clothes to the hospital/surgery center.    Preparing for Total Shoulder Arthroplasty ================================================================= Please follow these instructions carefully, in addition to any other special Bathing information that was explained to you at the Presurgical Appointment:  BENZOYL PEROXIDE 5% GEL: Used to kill bacteria on the skin which could cause an infection at the surgery site.   Please do not use if you have an allergy to benzoyl peroxide. If your skin becomes reddened/irritated stop using the benzoyl peroxide and inform your Doctor.   Starting two days before surgery, apply as follows:  1. Apply benzoyl peroxide gel in the morning and at night. Apply after taking a shower. If you are not taking a shower, clean entire shoulder front, back, and side, along with the armpit with a clean wet washcloth.  2. Place a quarter-sized dollop of the gel on your SHOULDER and rub in thoroughly, making sure to cover the front, back, and side of your shoulder, along with the armpit.   2 Days prior to Surgery  Wednesday July 20, 2022 First Dose  _______ Morning Second Dose  _______ Night  Day Before Surgery             Thursday July 21, 2022 First Dose  ______ Morning  On the night before surgery, wash your entire body (except hair, face and private areas) with CHG Soap. THEN, rub in the LAST application of the Benzoyl Peroxide Gel on your shoulder.   3. On the Morning of Surgery wash your BODY AGAIN with CHG Soap (except hair, face and private areas)  4. DO NOT USE THE BENZOYL PEROXIDE GEL ON THE DAY OF YOUR SURGERY     FAILURE TO FOLLOW THESE INSTRUCTIONS MAY  RESULT IN THE CANCELLATION OF YOUR SURGERY  PATIENT SIGNATURE_________________________________  NURSE SIGNATURE__________________________________  ________________________________________________________________________      Adam Phenix    An incentive spirometer is a tool that can help keep your lungs clear and active. This tool measures how well you are filling your lungs with each breath. Taking long deep breaths may help reverse or decrease the chance of developing breathing (pulmonary) problems (especially infection) following: A long period of time when you are unable to move or be active. BEFORE THE PROCEDURE  If the spirometer includes an indicator to show your best effort, your nurse or respiratory therapist will set it  to a desired goal. If possible, sit up straight or lean slightly forward. Try not to slouch. Hold the incentive spirometer in an upright position. INSTRUCTIONS FOR USE  Sit on the edge of your bed if possible, or sit up as far as you can in bed or on a chair. Hold the incentive spirometer in an upright position. Breathe out normally. Place the mouthpiece in your mouth and seal your lips tightly around it. Breathe in slowly and as deeply as possible, raising the piston or the ball toward the top of the column. Hold your breath for 3-5 seconds or for as long as possible. Allow the piston or ball to fall to the bottom of the column. Remove the mouthpiece from your mouth and breathe out normally. Rest for a few seconds and repeat Steps 1 through 7 at least 10 times every 1-2 hours when you are awake. Take your time and take a few normal breaths between deep breaths. The spirometer may include an indicator to show your best effort. Use the indicator as a goal to work toward during each repetition. After each set of 10 deep breaths, practice coughing to be sure your lungs are clear. If you have an incision (the cut made at the time of surgery), support your  incision when coughing by placing a pillow or rolled up towels firmly against it. Once you are able to get out of bed, walk around indoors and cough well. You may stop using the incentive spirometer when instructed by your caregiver.  RISKS AND COMPLICATIONS Take your time so you do not get dizzy or light-headed. If you are in pain, you may need to take or ask for pain medication before doing incentive spirometry. It is harder to take a deep breath if you are having pain. AFTER USE Rest and breathe slowly and easily. It can be helpful to keep track of a log of your progress. Your caregiver can provide you with a simple table to help with this. If you are using the spirometer at home, follow these instructions: [place] IF:  You are having difficultly using the spirometer. You have trouble using the spirometer as often as instructed. Your pain medication is not giving enough relief while using the spirometer. You develop fever of 100.5 F (38.1 C) or higher.                                                                                                    SEEK IMMEDIATE MEDICAL CARE IF:  You cough up bloody sputum that had not been present before. You develop fever of 102 F (38.9 C) or greater. You develop worsening pain at or near the incision site. MAKE SURE YOU:  Understand these instructions. Will watch your condition. Will get help right away if you are not doing well or get worse. Document Released: 02/20/2007 Document Revised: 01/02/2012 Document Reviewed: 04/23/2007 [facility] Patient Information 2014 [place], [place].

## 2022-07-02 NOTE — Progress Notes (Signed)
COVID Vaccine received:  '[]'$  No '[x]'$  Yes Date of any COVID positive Test in last 60 days:None  PCP - Dimas Chyle, MD Clearance note 06-17-22 in [place] Cardiologist - none now,  Had ablation by Dr. Caryl Comes in 2012,  Chest x-ray - 09-12-2011  Epic EKG -  06-17-2022  Epic Stress Test -  ECHO - 02-17-2011 Epic Cardiac Cath - none  Pacemaker/ICD device     '[x]'$  N/A Spinal Cord Stimulator:'[x]'$  No '[]'$  Yes      Other Implants:   History of Sleep Apnea? '[x]'$  No '[]'$  Yes   Sleep Study Date:   CPAP used?- '[x]'$  No '[]'$  Yes  (Instruct to bring their mask & Tubing)  HgbA1C-   7.0  (06-17-2022) epic  Does the patient monitor blood sugar? '[x]'$  No '[]'$  Yes  '[]'$  N/A Fasting Blood Sugar Ranges- ? Checks Blood Sugar _0_ times a day  Blood Thinner Instructions:   Patient had Ablation for A.flutter in 2012, per patient he hasn't had any problems since. Dr. Dimas Chyle wants PST to draw a PT-INR along with our Preop labs (I called and got a verbal order from [facility], RN for this to be drawn)  Aspirin Instructions: Takes half of ASA '325mg'$  tablet every other day alternating with Ibuprofen.    ERAS Protocol Ordered: '[]'$  No  '[x]'$  Yes PRE-SURGERY '[]'$  ENSURE  '[x]'$  G2   Comments: Hx Guillain-Barre Syndrome (2003). No residual effects per patient  Activity level: Patient can climb a flight of stairs without difficulty; '[x]'$  No CP  '[x]'$  No SOB,  Anesthesia review: Hx A.flutter (RFCA 03-2011 by Dr. Caryl Comes), HTN, Pre-DM  Patient denies shortness of breath, fever, cough and chest pain at PAT appointment.  Patient verbalized understanding and agreement to the Pre-Surgical Instructions that were given to them at this PAT appointment. Patient was also educated of the need to review these PAT instructions again prior to his/her surgery.I reviewed the appropriate phone numbers to call if they have any and questions or concerns.

## 2022-07-05 ENCOUNTER — Other Ambulatory Visit: Payer: Self-pay

## 2022-07-05 ENCOUNTER — Encounter (HOSPITAL_COMMUNITY): Payer: Self-pay

## 2022-07-05 ENCOUNTER — Telehealth: Payer: Self-pay | Admitting: *Deleted

## 2022-07-05 ENCOUNTER — Encounter (HOSPITAL_COMMUNITY)
Admission: RE | Admit: 2022-07-05 | Discharge: 2022-07-05 | Disposition: A | Discharge status: Home / Self Care | Payer: PPO | Source: Ambulatory Visit | Attending: Orthopedic Surgery | Admitting: Orthopedic Surgery

## 2022-07-05 VITALS — BP 160/82 | HR 68 | Temp 98.1°F | Resp 16 | Ht 72.0 in | Wt 266.0 lb

## 2022-07-05 DIAGNOSIS — I2510 Atherosclerotic heart disease of native coronary artery without angina pectoris: Secondary | ICD-10-CM | POA: Insufficient documentation

## 2022-07-05 DIAGNOSIS — Z01812 Encounter for preprocedural laboratory examination: Secondary | ICD-10-CM | POA: Insufficient documentation

## 2022-07-05 DIAGNOSIS — R7303 Prediabetes: Secondary | ICD-10-CM | POA: Diagnosis not present

## 2022-07-05 DIAGNOSIS — I10 Essential (primary) hypertension: Secondary | ICD-10-CM | POA: Diagnosis not present

## 2022-07-05 DIAGNOSIS — Z01818 Encounter for other preprocedural examination: Secondary | ICD-10-CM

## 2022-07-05 HISTORY — DX: Prediabetes: R73.03

## 2022-07-05 LAB — BASIC METABOLIC PANEL
Anion gap: 9 (ref 5–15)
BUN: 15 mg/dL (ref 8–23)
CO2: 25 mmol/L (ref 22–32)
Calcium: 9.8 mg/dL (ref 8.9–10.3)
Chloride: 103 mmol/L (ref 98–111)
Creatinine, Ser: 0.9 mg/dL (ref 0.61–1.24)
GFR, Estimated: >60 mL/min (ref >60)
Glucose, Bld: 135 mg/dL — ABNORMAL HIGH (ref 70–99)
Potassium: 5 mmol/L (ref 3.5–5.1)
Sodium: 137 mmol/L (ref 135–145)

## 2022-07-05 LAB — CBC
HCT: 44.6 % (ref 39.0–52.0)
Hemoglobin: 15 g/dL (ref 13.0–17.0)
MCH: 31.2 pg (ref 26.0–34.0)
MCHC: 33.6 g/dL (ref 30.0–36.0)
MCV: 92.7 fL (ref 80.0–100.0)
Platelets: 202 10*3/uL (ref 150–400)
RBC: 4.81 MIL/uL (ref 4.22–5.81)
RDW: 13.1 % (ref 11.5–15.5)
WBC: 6.9 10*3/uL (ref 4.0–10.5)
nRBC: 0 % (ref 0.0–0.2)

## 2022-07-05 LAB — SURGICAL PCR SCREEN
MRSA, PCR: NEGATIVE
Staphylococcus aureus: NEGATIVE

## 2022-07-05 LAB — PROTIME-INR
INR: 1.1 (ref 0.8–1.2)
Prothrombin Time: 13.7 seconds (ref 11.4–15.2)

## 2022-07-05 LAB — GLUCOSE, CAPILLARY: Glucose-Capillary: 135 mg/dL — ABNORMAL HIGH (ref 70–99)

## 2022-07-05 NOTE — Telephone Encounter (Signed)
Troy Garrison from [facility] called to obtain  verbal order to change the lab location from in clinic to their location. Verbal order given by Hildred Alamin, RN per previous notes from Dr. [name] and his [place].

## 2022-07-22 ENCOUNTER — Ambulatory Visit (HOSPITAL_BASED_OUTPATIENT_CLINIC_OR_DEPARTMENT_OTHER): Payer: PPO | Admitting: Certified Registered Nurse Anesthetist

## 2022-07-22 ENCOUNTER — Observation Stay (HOSPITAL_COMMUNITY)
Admission: RE | Admit: 2022-07-22 | Discharge: 2022-07-23 | Disposition: A | Discharge status: Home / Self Care | Payer: PPO | Source: Ambulatory Visit | Attending: Orthopedic Surgery | Admitting: Orthopedic Surgery

## 2022-07-22 ENCOUNTER — Encounter (HOSPITAL_COMMUNITY)
Admission: RE | Disposition: A | Discharge status: Home / Self Care | Payer: Self-pay | Source: Ambulatory Visit | Attending: Orthopedic Surgery

## 2022-07-22 ENCOUNTER — Other Ambulatory Visit: Payer: Self-pay

## 2022-07-22 ENCOUNTER — Observation Stay (HOSPITAL_COMMUNITY): Payer: PPO

## 2022-07-22 ENCOUNTER — Ambulatory Visit (HOSPITAL_COMMUNITY): Payer: PPO | Admitting: Physician Assistant

## 2022-07-22 ENCOUNTER — Encounter (HOSPITAL_COMMUNITY): Payer: Self-pay | Admitting: Orthopedic Surgery

## 2022-07-22 DIAGNOSIS — M12811 Other specific arthropathies, not elsewhere classified, right shoulder: Secondary | ICD-10-CM | POA: Diagnosis not present

## 2022-07-22 DIAGNOSIS — I10 Essential (primary) hypertension: Secondary | ICD-10-CM | POA: Diagnosis not present

## 2022-07-22 DIAGNOSIS — Z471 Aftercare following joint replacement surgery: Secondary | ICD-10-CM | POA: Diagnosis not present

## 2022-07-22 DIAGNOSIS — Z96612 Presence of left artificial shoulder joint: Secondary | ICD-10-CM | POA: Insufficient documentation

## 2022-07-22 DIAGNOSIS — Z96653 Presence of artificial knee joint, bilateral: Secondary | ICD-10-CM | POA: Diagnosis not present

## 2022-07-22 DIAGNOSIS — M75121 Complete rotator cuff tear or rupture of right shoulder, not specified as traumatic: Secondary | ICD-10-CM | POA: Diagnosis not present

## 2022-07-22 DIAGNOSIS — Z87891 Personal history of nicotine dependence: Secondary | ICD-10-CM | POA: Insufficient documentation

## 2022-07-22 DIAGNOSIS — Z96611 Presence of right artificial shoulder joint: Secondary | ICD-10-CM

## 2022-07-22 DIAGNOSIS — M75101 Unspecified rotator cuff tear or rupture of right shoulder, not specified as traumatic: Principal | ICD-10-CM | POA: Insufficient documentation

## 2022-07-22 DIAGNOSIS — Z7982 Long term (current) use of aspirin: Secondary | ICD-10-CM | POA: Insufficient documentation

## 2022-07-22 DIAGNOSIS — Z79899 Other long term (current) drug therapy: Secondary | ICD-10-CM | POA: Insufficient documentation

## 2022-07-22 DIAGNOSIS — Z85828 Personal history of other malignant neoplasm of skin: Secondary | ICD-10-CM | POA: Insufficient documentation

## 2022-07-22 DIAGNOSIS — R7303 Prediabetes: Secondary | ICD-10-CM

## 2022-07-22 HISTORY — PX: REVERSE SHOULDER ARTHROPLASTY: SHX5054

## 2022-07-22 LAB — GLUCOSE, CAPILLARY: Glucose-Capillary: 134 mg/dL — ABNORMAL HIGH (ref 70–99)

## 2022-07-22 SURGERY — ARTHROPLASTY, SHOULDER, TOTAL, REVERSE
Anesthesia: General | Site: Shoulder | Laterality: Right

## 2022-07-22 MED ORDER — PHENYLEPHRINE 80 MCG/ML (10ML) SYRINGE FOR IV PUSH (FOR BLOOD PRESSURE SUPPORT)
PREFILLED_SYRINGE | INTRAVENOUS | Status: AC
  Filled 2022-07-22: qty 10

## 2022-07-22 MED ORDER — HYDROMORPHONE HCL 1 MG/ML IJ SOLN
0.2500 mg | INTRAMUSCULAR | Status: DC | PRN
  Administered 2022-07-22 (×2): 0.5 mg via INTRAVENOUS

## 2022-07-22 MED ORDER — PROPOFOL 10 MG/ML IV BOLUS
INTRAVENOUS | Status: AC
  Filled 2022-07-22: qty 20

## 2022-07-22 MED ORDER — FENTANYL CITRATE PF 50 MCG/ML IJ SOSY
50.0000 ug | PREFILLED_SYRINGE | Freq: Once | INTRAMUSCULAR | Status: DC
  Filled 2022-07-22: qty 2

## 2022-07-22 MED ORDER — ONDANSETRON HCL 4 MG/2ML IJ SOLN
INTRAMUSCULAR | Status: AC
  Filled 2022-07-22: qty 2

## 2022-07-22 MED ORDER — OXYCODONE-ACETAMINOPHEN 5-325 MG PO TABS: 1.0000 | ORAL_TABLET | ORAL | 0 refills | Status: DC | PRN

## 2022-07-22 MED ORDER — FENTANYL CITRATE (PF) 250 MCG/5ML IJ SOLN
INTRAMUSCULAR | Status: AC
  Filled 2022-07-22: qty 5

## 2022-07-22 MED ORDER — STERILE WATER FOR IRRIGATION IR SOLN
Status: DC | PRN
  Administered 2022-07-22: 2000 mL

## 2022-07-22 MED ORDER — POLYETHYLENE GLYCOL 3350 17 G PO PACK: 17.0000 g | PACK | Freq: Every day | ORAL | Status: DC | PRN

## 2022-07-22 MED ORDER — ONDANSETRON HCL 4 MG/2ML IJ SOLN: 4.0000 mg | Freq: Four times a day (QID) | INTRAMUSCULAR | Status: DC | PRN

## 2022-07-22 MED ORDER — METOCLOPRAMIDE HCL 5 MG/ML IJ SOLN: 5.0000 mg | Freq: Three times a day (TID) | INTRAMUSCULAR | Status: DC | PRN

## 2022-07-22 MED ORDER — FENTANYL CITRATE (PF) 100 MCG/2ML IJ SOLN
INTRAMUSCULAR | Status: DC | PRN
  Administered 2022-07-22 (×2): 50 ug via INTRAVENOUS

## 2022-07-22 MED ORDER — DOCUSATE SODIUM 100 MG PO CAPS
100.0000 mg | ORAL_CAPSULE | Freq: Two times a day (BID) | ORAL | Status: DC
  Administered 2022-07-22 – 2022-07-23 (×2): 100 mg via ORAL
  Filled 2022-07-22 (×2): qty 1

## 2022-07-22 MED ORDER — PROMETHAZINE HCL 25 MG/ML IJ SOLN: 6.2500 mg | INTRAMUSCULAR | Status: DC | PRN

## 2022-07-22 MED ORDER — IBUPROFEN 200 MG PO TABS
200.0000 mg | ORAL_TABLET | Freq: Three times a day (TID) | ORAL | Status: DC | PRN
  Administered 2022-07-23: 400 mg via ORAL
  Filled 2022-07-22: qty 2

## 2022-07-22 MED ORDER — SODIUM CHLORIDE 0.9 % IR SOLN
Status: DC | PRN
  Administered 2022-07-22: 1000 mL

## 2022-07-22 MED ORDER — OXYCODONE HCL 5 MG PO TABS
5.0000 mg | ORAL_TABLET | Freq: Once | ORAL | Status: AC | PRN
  Administered 2022-07-22: 5 mg via ORAL

## 2022-07-22 MED ORDER — HYDROMORPHONE HCL 1 MG/ML IJ SOLN
INTRAMUSCULAR | Status: DC | PRN
  Administered 2022-07-22 (×2): .5 mg via INTRAVENOUS

## 2022-07-22 MED ORDER — AMISULPRIDE (ANTIEMETIC) 5 MG/2ML IV SOLN: 10.0000 mg | Freq: Once | INTRAVENOUS | Status: DC | PRN

## 2022-07-22 MED ORDER — OXYCODONE HCL 5 MG/5ML PO SOLN: 5.0000 mg | Freq: Once | ORAL | Status: AC | PRN

## 2022-07-22 MED ORDER — EPHEDRINE SULFATE (PRESSORS) 50 MG/ML IJ SOLN
INTRAMUSCULAR | Status: DC | PRN
  Administered 2022-07-22 (×2): 5 mg via INTRAVENOUS

## 2022-07-22 MED ORDER — ONDANSETRON HCL 4 MG PO TABS: 4.0000 mg | ORAL_TABLET | Freq: Four times a day (QID) | ORAL | Status: DC | PRN

## 2022-07-22 MED ORDER — PHENYLEPHRINE HCL-NACL 20-0.9 MG/250ML-% IV SOLN
INTRAVENOUS | Status: AC
  Filled 2022-07-22: qty 250

## 2022-07-22 MED ORDER — ORAL CARE MOUTH RINSE: 15.0000 mL | Freq: Once | OROMUCOSAL | Status: AC

## 2022-07-22 MED ORDER — CHLORHEXIDINE GLUCONATE 0.12 % MT SOLN
15.0000 mL | Freq: Once | OROMUCOSAL | Status: AC
  Administered 2022-07-22: 15 mL via OROMUCOSAL

## 2022-07-22 MED ORDER — ADULT MULTIVITAMIN W/MINERALS CH
1.0000 | ORAL_TABLET | Freq: Every day | ORAL | Status: DC
  Administered 2022-07-23: 1 via ORAL
  Filled 2022-07-22: qty 1

## 2022-07-22 MED ORDER — DEXAMETHASONE SODIUM PHOSPHATE 4 MG/ML IJ SOLN
INTRAMUSCULAR | Status: DC | PRN
  Administered 2022-07-22: 8 mg via INTRAVENOUS

## 2022-07-22 MED ORDER — ROCURONIUM BROMIDE 10 MG/ML (PF) SYRINGE
PREFILLED_SYRINGE | INTRAVENOUS | Status: AC
  Filled 2022-07-22: qty 10

## 2022-07-22 MED ORDER — MENTHOL 3 MG MT LOZG: 1.0000 | LOZENGE | OROMUCOSAL | Status: DC | PRN

## 2022-07-22 MED ORDER — PHENOL 1.4 % MT LIQD: 1.0000 | OROMUCOSAL | Status: DC | PRN

## 2022-07-22 MED ORDER — HYDROMORPHONE HCL 2 MG/ML IJ SOLN
INTRAMUSCULAR | Status: AC
  Filled 2022-07-22: qty 1

## 2022-07-22 MED ORDER — MELATONIN 5 MG PO TABS
5.0000 mg | ORAL_TABLET | Freq: Every day | ORAL | Status: DC
  Administered 2022-07-22: 5 mg via ORAL
  Filled 2022-07-22: qty 1

## 2022-07-22 MED ORDER — BUPIVACAINE-EPINEPHRINE (PF) 0.25% -1:200000 IJ SOLN
INTRAMUSCULAR | Status: AC
  Filled 2022-07-22: qty 30

## 2022-07-22 MED ORDER — OXYCODONE HCL 5 MG PO TABS
ORAL_TABLET | ORAL | Status: AC
  Filled 2022-07-22: qty 1

## 2022-07-22 MED ORDER — HYDROMORPHONE HCL 1 MG/ML IJ SOLN
INTRAMUSCULAR | Status: AC
  Filled 2022-07-22: qty 1

## 2022-07-22 MED ORDER — METHOCARBAMOL 500 MG IVPB - SIMPLE MED
INTRAVENOUS | Status: AC
  Filled 2022-07-22: qty 55

## 2022-07-22 MED ORDER — CEFAZOLIN IN SODIUM CHLORIDE 3-0.9 GM/100ML-% IV SOLN
3.0000 g | INTRAVENOUS | Status: AC
  Administered 2022-07-22: 3 g via INTRAVENOUS
  Filled 2022-07-22: qty 100

## 2022-07-22 MED ORDER — METHOCARBAMOL 500 MG PO TABS
500.0000 mg | ORAL_TABLET | Freq: Four times a day (QID) | ORAL | Status: DC | PRN
  Administered 2022-07-22: 500 mg via ORAL
  Filled 2022-07-22: qty 1

## 2022-07-22 MED ORDER — ROCURONIUM BROMIDE 10 MG/ML (PF) SYRINGE
PREFILLED_SYRINGE | INTRAVENOUS | Status: DC | PRN
  Administered 2022-07-22: 100 mg via INTRAVENOUS

## 2022-07-22 MED ORDER — OXYCODONE HCL 5 MG PO TABS
5.0000 mg | ORAL_TABLET | ORAL | Status: DC | PRN
  Administered 2022-07-22: 5 mg via ORAL
  Filled 2022-07-22: qty 1
  Filled 2022-07-22: qty 2

## 2022-07-22 MED ORDER — PHENYLEPHRINE HCL-NACL 20-0.9 MG/250ML-% IV SOLN
INTRAVENOUS | Status: DC | PRN
  Administered 2022-07-22: 10 ug/min via INTRAVENOUS

## 2022-07-22 MED ORDER — HYDROMORPHONE HCL 1 MG/ML IJ SOLN: 0.5000 mg | INTRAMUSCULAR | Status: DC | PRN

## 2022-07-22 MED ORDER — METOCLOPRAMIDE HCL 5 MG PO TABS: 5.0000 mg | ORAL_TABLET | Freq: Three times a day (TID) | ORAL | Status: DC | PRN

## 2022-07-22 MED ORDER — LACTATED RINGERS IV SOLN: INTRAVENOUS | Status: DC

## 2022-07-22 MED ORDER — MIDAZOLAM HCL 2 MG/2ML IJ SOLN
1.0000 mg | Freq: Once | INTRAMUSCULAR | Status: DC
  Filled 2022-07-22: qty 2

## 2022-07-22 MED ORDER — CEFAZOLIN SODIUM-DEXTROSE 2-4 GM/100ML-% IV SOLN
2.0000 g | Freq: Four times a day (QID) | INTRAVENOUS | Status: AC
  Administered 2022-07-22 – 2022-07-23 (×3): 2 g via INTRAVENOUS
  Filled 2022-07-22 (×3): qty 100

## 2022-07-22 MED ORDER — LIDOCAINE HCL (PF) 2 % IJ SOLN
INTRAMUSCULAR | Status: AC
Start: 2022-07-22 — End: ?
  Filled 2022-07-22: qty 5

## 2022-07-22 MED ORDER — ONDANSETRON HCL 4 MG/2ML IJ SOLN
INTRAMUSCULAR | Status: DC | PRN
  Administered 2022-07-22: 4 mg via INTRAVENOUS

## 2022-07-22 MED ORDER — OXYMETAZOLINE HCL 0.05 % NA SOLN
1.0000 | Freq: Every day | NASAL | Status: DC
  Administered 2022-07-22: 1 via NASAL
  Filled 2022-07-22: qty 15

## 2022-07-22 MED ORDER — SUGAMMADEX SODIUM 200 MG/2ML IV SOLN
INTRAVENOUS | Status: DC | PRN
  Administered 2022-07-22: 200 mg via INTRAVENOUS
  Administered 2022-07-22: 100 mg via INTRAVENOUS

## 2022-07-22 MED ORDER — ASPIRIN 81 MG PO TBEC: 162.0000 mg | DELAYED_RELEASE_TABLET | Freq: Every day | ORAL | Status: DC | PRN

## 2022-07-22 MED ORDER — ACETAMINOPHEN 325 MG PO TABS
325.0000 mg | ORAL_TABLET | Freq: Four times a day (QID) | ORAL | Status: DC | PRN
  Administered 2022-07-23: 650 mg via ORAL
  Filled 2022-07-22: qty 2

## 2022-07-22 MED ORDER — METHOCARBAMOL 500 MG IVPB - SIMPLE MED
500.0000 mg | Freq: Four times a day (QID) | INTRAVENOUS | Status: DC | PRN
  Administered 2022-07-22: 500 mg via INTRAVENOUS

## 2022-07-22 MED ORDER — SUGAMMADEX SODIUM 500 MG/5ML IV SOLN
INTRAVENOUS | Status: AC
  Filled 2022-07-22: qty 5

## 2022-07-22 MED ORDER — OMEGA-3-ACID ETHYL ESTERS 1 G PO CAPS
1.0000 g | ORAL_CAPSULE | Freq: Every day | ORAL | Status: DC
  Administered 2022-07-23: 1 g via ORAL
  Filled 2022-07-22: qty 1

## 2022-07-22 MED ORDER — DEXAMETHASONE SODIUM PHOSPHATE 10 MG/ML IJ SOLN
INTRAMUSCULAR | Status: AC
  Filled 2022-07-22: qty 1

## 2022-07-22 MED ORDER — PRAVASTATIN SODIUM 20 MG PO TABS: 20.0000 mg | ORAL_TABLET | Freq: Every day | ORAL | Status: DC

## 2022-07-22 MED ORDER — METHOCARBAMOL 750 MG PO TABS: 750.0000 mg | ORAL_TABLET | Freq: Three times a day (TID) | ORAL | 1 refills | Status: DC | PRN

## 2022-07-22 MED ORDER — LIDOCAINE 2% (20 MG/ML) 5 ML SYRINGE
INTRAMUSCULAR | Status: DC | PRN
  Administered 2022-07-22: 200 mg via INTRAVENOUS

## 2022-07-22 MED ORDER — LISINOPRIL 10 MG PO TABS
10.0000 mg | ORAL_TABLET | Freq: Every day | ORAL | Status: DC
  Administered 2022-07-22 – 2022-07-23 (×2): 10 mg via ORAL
  Filled 2022-07-22 (×2): qty 1

## 2022-07-22 MED ORDER — PROPOFOL 10 MG/ML IV BOLUS
INTRAVENOUS | Status: DC | PRN
  Administered 2022-07-22: 150 mg via INTRAVENOUS

## 2022-07-22 MED ORDER — BUPIVACAINE-EPINEPHRINE (PF) 0.25% -1:200000 IJ SOLN
INTRAMUSCULAR | Status: DC | PRN
  Administered 2022-07-22: 20 mL

## 2022-07-22 MED ORDER — PHENYLEPHRINE 80 MCG/ML (10ML) SYRINGE FOR IV PUSH (FOR BLOOD PRESSURE SUPPORT)
PREFILLED_SYRINGE | INTRAVENOUS | Status: DC | PRN
  Administered 2022-07-22 (×4): 80 ug via INTRAVENOUS
  Administered 2022-07-22: 40 ug via INTRAVENOUS

## 2022-07-22 SURGICAL SUPPLY — 77 items
AID PSTN UNV HD RSTRNT DISP (MISCELLANEOUS) ×1
BAG COUNTER SPONGE SURGICOUNT (BAG) IMPLANT
BAG SPEC THK2 15X12 ZIP CLS (MISCELLANEOUS) ×1
BAG SPNG CNTER NS LX DISP (BAG) ×1
BAG ZIPLOCK 12X15 (MISCELLANEOUS) IMPLANT
BIT DRILL 1.6MX128 (BIT) IMPLANT
BIT DRILL 170X2.5X (BIT) IMPLANT
BIT DRL 170X2.5X (BIT) ×2
BLADE SAG 18X100X1.27 (BLADE) ×2 IMPLANT
CLSR STERI-STRIP ANTIMIC 1/2X4 (GAUZE/BANDAGES/DRESSINGS) IMPLANT
COVER BACK TABLE 60X90IN (DRAPES) ×2 IMPLANT
COVER SURGICAL LIGHT HANDLE (MISCELLANEOUS) ×2 IMPLANT
CUP HUMERAL 42 PLUS 3 (Orthopedic Implant) IMPLANT
DRAPE INCISE IOBAN 66X45 STRL (DRAPES) ×2 IMPLANT
DRAPE ORTHO SPLIT 77X108 STRL (DRAPES) ×2
DRAPE SHEET LG 3/4 BI-LAMINATE (DRAPES) ×2 IMPLANT
DRAPE SURG ORHT 6 SPLT 77X108 (DRAPES) ×4 IMPLANT
DRAPE TOP 10253 STERILE (DRAPES) ×2 IMPLANT
DRAPE U-SHAPE 47X51 STRL (DRAPES) ×2 IMPLANT
DRILL 2.5 (BIT) ×2
DRSG ADAPTIC 3X8 NADH LF (GAUZE/BANDAGES/DRESSINGS) ×2 IMPLANT
DURAPREP 26ML APPLICATOR (WOUND CARE) ×2 IMPLANT
ELECT BLADE TIP CTD 4 INCH (ELECTRODE) ×2 IMPLANT
ELECT NDL TIP 2.8 STRL (NEEDLE) ×2 IMPLANT
ELECT NEEDLE TIP 2.8 STRL (NEEDLE) ×1 IMPLANT
ELECT REM PT RETURN 15FT ADLT (MISCELLANEOUS) ×2 IMPLANT
EPIPHYSI RIGHT SZ 2 (Shoulder) ×1 IMPLANT
EPIPHYSIS RIGHT SZ 2 (Shoulder) IMPLANT
FACESHIELD WRAPAROUND (MASK) ×2 IMPLANT
FACESHIELD WRAPAROUND OR TEAM (MASK) ×2 IMPLANT
GAUZE PAD ABD 8X10 STRL (GAUZE/BANDAGES/DRESSINGS) ×2 IMPLANT
GAUZE SPONGE 4X4 12PLY STRL (GAUZE/BANDAGES/DRESSINGS) ×2 IMPLANT
GLENOSPHERE XTEND LAT 42+0 STD (Miscellaneous) IMPLANT
GLOVE BIOGEL PI IND STRL 7.5 (GLOVE) ×2 IMPLANT
GLOVE BIOGEL PI IND STRL 8.5 (GLOVE) ×2 IMPLANT
GLOVE ORTHO TXT STRL SZ7.5 (GLOVE) ×2 IMPLANT
GLOVE SURG ORTHO 8.5 STRL (GLOVE) ×2 IMPLANT
GOWN STRL REUS W/ TWL XL LVL3 (GOWN DISPOSABLE) ×4 IMPLANT
GOWN STRL REUS W/TWL XL LVL3 (GOWN DISPOSABLE) ×2
KIT BASIN OR (CUSTOM PROCEDURE TRAY) ×2 IMPLANT
KIT TURNOVER KIT A (KITS) IMPLANT
MANIFOLD NEPTUNE II (INSTRUMENTS) ×2 IMPLANT
METAGLENE DELTA EXTEND (Trauma) IMPLANT
METAGLENE DXTEND (Trauma) ×1 IMPLANT
NDL MAYO CATGUT SZ4 TPR NDL (NEEDLE) ×2 IMPLANT
NEEDLE MAYO CATGUT SZ4 (NEEDLE) ×1 IMPLANT
NS IRRIG 1000ML POUR BTL (IV SOLUTION) ×2 IMPLANT
PACK SHOULDER (CUSTOM PROCEDURE TRAY) ×2 IMPLANT
PAD ORTHO SHOULDER 7X19 LRG (SOFTGOODS) IMPLANT
PIN GUIDE 1.2 (PIN) IMPLANT
PIN GUIDE GLENOPHERE 1.5MX300M (PIN) IMPLANT
PIN METAGLENE 2.5 (PIN) IMPLANT
PROTECTOR NERVE ULNAR (MISCELLANEOUS) ×2 IMPLANT
RESTRAINT HEAD UNIVERSAL NS (MISCELLANEOUS) ×2 IMPLANT
SCREW 4.5X24MM (Screw) ×1 IMPLANT
SCREW 4.5X36MM (Screw) IMPLANT
SCREW BN 24X4.5XLCK STRL (Screw) IMPLANT
SCREW LOCK DELTA XTEND 4.5X30 (Screw) IMPLANT
SLING ARM FOAM STRAP LRG (SOFTGOODS) IMPLANT
SLING ARM FOAM STRAP XLG (SOFTGOODS) IMPLANT
SMARTMIX MINI TOWER (MISCELLANEOUS)
SPIKE FLUID TRANSFER (MISCELLANEOUS) ×2 IMPLANT
SPONGE T-LAP 4X18 ~~LOC~~+RFID (SPONGE) ×2 IMPLANT
STEM 12 HA (Stem) IMPLANT
STRIP CLOSURE SKIN 1/2X4 (GAUZE/BANDAGES/DRESSINGS) ×2 IMPLANT
SUCTION FRAZIER HANDLE 10FR (MISCELLANEOUS) ×1
SUCTION TUBE FRAZIER 10FR DISP (MISCELLANEOUS) ×2 IMPLANT
SUT FIBERWIRE #2 38 T-5 BLUE (SUTURE) ×2
SUT MNCRL AB 4-0 PS2 18 (SUTURE) ×2 IMPLANT
SUT VIC AB 0 CT1 36 (SUTURE) ×4 IMPLANT
SUT VIC AB 0 CT2 27 (SUTURE) ×2 IMPLANT
SUT VIC AB 2-0 CT1 27 (SUTURE) ×1
SUT VIC AB 2-0 CT1 TAPERPNT 27 (SUTURE) ×2 IMPLANT
SUTURE FIBERWR #2 38 T-5 BLUE (SUTURE) ×4 IMPLANT
TAPE PAPER 3X10 WHT MICROPORE (GAUZE/BANDAGES/DRESSINGS) IMPLANT
TOWEL OR 17X26 10 PK STRL BLUE (TOWEL DISPOSABLE) ×2 IMPLANT
TOWER SMARTMIX MINI (MISCELLANEOUS) IMPLANT

## 2022-07-22 NOTE — Care Plan (Signed)
Ortho Bundle Case Management Note  Patient Details  Name: Troy Garrison MRN: [id number] Date of Birth: 11/21/1940                  R Rev TSA on 07/22/22. DCP: Home with wife Stanton Kidney. DME: Sling and ice machine provided by hospital. PT: HEP   DME Arranged:  N/A DME Agency:     HH Arranged:    [place] Agency:     Additional Comments: Please contact me with any questions of if this plan should need to change.  Marianne Sofia, RN,CCM [facility]  [phone] 07/22/2022, 12:54 PM

## 2022-07-22 NOTE — Discharge Instructions (Signed)
Ice to the shoulder constantly.  Keep the incision covered and clean and dry for one week, then ok to get it wet in the shower. ° °Do exercise as instructed several times per day. ° °DO NOT reach behind your back or push up out of a chair with the operative arm. ° °Use a sling while you are up and around for comfort, may remove while seated.  Keep pillow propped behind the operative elbow. ° °Follow up with Dr Dynasti Kerman in two weeks in the office, call [phone] for appt °

## 2022-07-22 NOTE — Anesthesia Procedure Notes (Signed)
Procedure Name: Intubation Date/Time: 07/22/2022 1:31 PM  Performed by: Deliah Boston, CRNAPre-anesthesia Checklist: Patient identified, Emergency Drugs available, Suction available and Patient being monitored Patient Re-evaluated:Patient Re-evaluated prior to induction Oxygen Delivery Method: Circle system utilized Preoxygenation: Pre-oxygenation with 100% oxygen Induction Type: IV induction Ventilation: Mask ventilation without difficulty Laryngoscope Size: Glidescope and 3 Grade View: Grade I Tube type: Oral Tube size: 7.5 mm Number of attempts: 1 Airway Equipment and Method: Stylet Placement Confirmation: ETT inserted through vocal cords under direct vision, positive ETCO2 and breath sounds checked- equal and bilateral Secured at: 20 cm Tube secured with: Tape Dental Injury: Teeth and Oropharynx as per pre-operative assessment  Difficulty Due To: Difficulty was anticipated Comments: Glide scope used in previous intubation due to  anterior larynx and reduced neck mobility

## 2022-07-22 NOTE — Transfer of Care (Signed)
Immediate Anesthesia Transfer of Care Note  Patient: Troy Garrison  Procedure(s) Performed: REVERSE SHOULDER ARTHROPLASTY (Right: Shoulder)  Patient Location: PACU  Anesthesia Type:General  Level of Consciousness: awake  Airway & Oxygen Therapy: Patient Spontanous Breathing and Patient connected to face mask oxygen  Post-op Assessment: Report given to RN and Post -op Vital signs reviewed and stable  Post vital signs: Reviewed and stable  Last Vitals:  Vitals Value Taken Time  BP 149/79 07/22/22 1546  Temp    Pulse 71 07/22/22 1551  Resp    SpO2 100 % 07/22/22 1551  Vitals shown include unvalidated device data.  Last Pain:  Vitals:   07/22/22 1150  TempSrc:   PainSc: 0-No pain         Complications:  Encounter Notable Events  Notable Event Outcome Phase Comment  Difficult to intubate - expected  Intraprocedure Filed from anesthesia note documentation.

## 2022-07-22 NOTE — Anesthesia Postprocedure Evaluation (Signed)
Anesthesia Post Note  Patient: Troy Garrison  Procedure(s) Performed: REVERSE SHOULDER ARTHROPLASTY (Right: Shoulder)     Patient location during evaluation: PACU Anesthesia Type: General Level of consciousness: awake and alert Pain management: pain level controlled Vital Signs Assessment: post-procedure vital signs reviewed and stable Respiratory status: spontaneous breathing, nonlabored ventilation, respiratory function stable and patient connected to nasal cannula oxygen Cardiovascular status: blood pressure returned to baseline and stable Postop Assessment: no apparent nausea or vomiting Anesthetic complications: yes   Encounter Notable Events  Notable Event Outcome Phase Comment  Difficult to intubate - expected  Intraprocedure Filed from anesthesia note documentation.    Last Vitals:  Vitals:   07/22/22 1700 07/22/22 1723  BP:  (!) 151/72  Pulse: 82 74  Resp:  16  Temp:  36.7 C  SpO2: 98% 91%    Last Pain:  Vitals:   07/22/22 1723  TempSrc: Oral  PainSc:                  [place] Lighter

## 2022-07-22 NOTE — Op Note (Unsigned)
NAME: Troy Garrison, Troy Garrison MEDICAL RECORD NO: [id number] ACCOUNT NO: [id number] DATE OF BIRTH: 09/13/1941 FACILITY: [facility]ATION: [facility] PHYSICIAN: Doran Heater. Veverly Fells, MD  Operative Report   DATE OF PROCEDURE: 07/22/2022   PREOPERATIVE DIAGNOSIS:  Right shoulder rotator cuff tear arthropathy.  POSTOPERATIVE DIAGNOSIS:  Right shoulder rotator cuff tear arthropathy.  PROCEDURE PERFORMED:  Right reverse shoulder replacement using DePuy Delta Xtend prosthesis with no subscap repair.  ATTENDING SURGEON:  Doran Heater. Veverly Fells, MD  ASSISTANT:  Charletta Cousin [place], [place], who was scrubbed during the entire procedure, and necessary for satisfactory completion of surgery.  ANESTHESIA:  General anesthesia was used plus interscalene block.  ESTIMATED BLOOD LOSS:  400 mL  FLUID REPLACEMENT:  1500 mL crystalloid.  INSTRUMENT COUNTS:  Correct.  COMPLICATIONS:  No complications.  ANTIBIOTICS:  Perioperative antibiotics were given.  INDICATIONS:  The patient is a 81 year old male who presents with a history of worsening right shoulder pain and dysfunction secondary to end-stage rotator cuff tear arthropathy.  Having failed conservative management over an extended period of time, the  patient presents for right reverse shoulder replacement to restore fixed focal mechanics to the right shoulder, improving function and reducing pain.  Informed consent obtained.  DESCRIPTION OF PROCEDURE:  After an adequate level of anesthesia was achieved, the patient was positioned in the modified beach chair position.  Right shoulder correctly identified and sterilely prepped and draped in the usual manner.  Timeout called,  verifying correct patient, correct site, we entered the right shoulder using standard deltopectoral approach, starting at the coracoid process extending down to the anterior humerus.  Dissection down through subcutaneous tissues using Bovie  electrocautery.  We identified the cephalic vein and took that  laterally with the deltoid pectoralis taken medially.  Conjoined tendon identified and retracted medially.  Deep retractors were placed.  We went ahead and tenodesed the biceps in situ with 0  Vicryl figure-of-eight suture x2.  Although we really could not see the intra-articular portion of biceps and suspect that was chronically torn.  Next, we released the subscap remnant tagging for protection of the axillary nerve.  We released the  inferior capsule extending the shoulder and delivering the humeral head out of the wound.  Advanced arthritis was noted.  We entered the proximal humerus with a 6 mm reamer, reaming up to a size 12.  We then used the 12 mm T-handle guide and resected the  head at 20 degrees of retroversion with the oscillating saw.  We removed excess osteophytes with a rongeur.  We then went ahead and subluxed the humerus posteriorly, gaining exposure of the glenoid face, which was quite difficult.  The entire capsule  was extremely scarred and thickened.  We went ahead and resected the capsule, resected the glenoid labrum and then found our center point for a guide pin.  We placed our guide pin centered low on the glenoid and reamed for the metaglene baseplate.  We  then did our peripheral hand reaming and drilled out our central peg hole.  We then impacted the HA coated press-fit baseplate into position.  We used a 30-screw at the baseplate down to the 6 o'clock position and then a 36 screw at the base of coracoid  and then 24 locked anteriorly.  Once we had all those 3 screws in place we locked those into position.  We went ahead and then selected a 42+0 standard glenosphere and attached that to the baseplate with a screwdriver.  We went back to the  humeral side  and reamed for the 2 right metaphysis and then trialled with the 12 stem, 2 right metaphysis set on 0 setting and with a 42+3 poly.  Once we had that in position, we reduced the shoulder.  We were happy with our soft tissue  balancing and range of motion  and stability.  We removed all the trial components from the humeral side.  We irrigated thoroughly and then used available bone graft from the humeral head with impaction grafting technique and used the HA coated size 12 stem with the 2 right metaphysis  set on the 0 setting and impacted in 20 degrees of retroversion.  We then used a 42+3 poly, placed it on the humeral tray, reduced the shoulder and we were again happy with our soft tissue stability.  We had appropriate tension on the conjoined tendon.   No gapping with external rotation or inferior pole.  Next, we irrigated thoroughly, removed our tag sutures from the subscap remnant, which was really behind the conjoined tendon.  We did not have to resect any more that.  Next, we closed the  deltopectoral interval with 0 Vicryl suture followed by 2-0 Vicryl for subcutaneous closure and 4-0 Monocryl for skin.  Steri-Strips applied followed by sterile dressing.  The patient tolerated surgery well.     SUJ D: 07/22/2022 3:53:09 pm T: 07/22/2022 9:57:00 pm  JOB: [id number]/ [id number]

## 2022-07-22 NOTE — Interval H&P Note (Signed)
History and Physical Interval Note:  07/22/2022 11:43 AM  Troy Garrison  has presented today for surgery, with the diagnosis of Full thickness rotator cuff tear.  The various methods of treatment have been discussed with the patient and family. After consideration of risks, benefits and other options for treatment, the patient has consented to  Procedure(s) with comments: REVERSE SHOULDER ARTHROPLASTY (Right) - no block as a surgical intervention.  The patient's history has been reviewed, patient examined, no change in status, stable for surgery.  I have reviewed the patient's chart and labs.  Questions were answered to the patient's satisfaction.     [name]

## 2022-07-22 NOTE — Anesthesia Preprocedure Evaluation (Addendum)
Anesthesia Evaluation  Patient identified by MRN, date of birth, ID band Patient awake    Reviewed: Allergy & Precautions, NPO status , Patient's Chart, lab work & pertinent test results  Airway Mallampati: II       Dental  (+) Dental Advisory Given, Teeth Intact   Pulmonary neg pulmonary ROS, former smoker,    Pulmonary exam normal        Cardiovascular hypertension, Pt. on medications Normal cardiovascular exam Rhythm:Regular Rate:Normal     Neuro/Psych  Neuromuscular disease Troy Garrison Barre  2003) negative psych ROS   GI/Hepatic Neg liver ROS, PUD, GERD  Medicated and Controlled,  Endo/Other  negative endocrine ROS  Renal/GU Lab Results      Component                Value               Date                      CREATININE               0.69                08/14/2021                BUN                      13                  08/14/2021                NA                       131 (L)             08/14/2021                K                        4.6                 08/14/2021                       negative genitourinary   Musculoskeletal  (+) Arthritis , Osteoarthritis,    Abdominal (+) + obese (BMi 35.26),   Peds  Hematology negative hematology ROS (+) Lab Results      Component                Value               Date                         HGB                      14.6                08/14/2021                HCT                      44.6                08/14/2021  PLT                      190.0               07/27/2021              Anesthesia Other Findings   Reproductive/Obstetrics                            Anesthesia Physical  Anesthesia Plan  ASA: 3  Anesthesia Plan: General   Post-op Pain Management: Dilaudid IV and Ofirmev IV (intra-op)*   Induction: Intravenous  PONV Risk Score and Plan: 2 and Treatment may vary due to age or medical condition,  Ondansetron and Midazolam  Airway Management Planned: Oral ETT  Additional Equipment: None  Intra-op Plan:   Post-operative Plan: Extubation in OR  Informed Consent:     Dental advisory given  Plan Discussed with: CRNA  Anesthesia Plan Comments: (GA no block patient feels like residual nerve damage on non operative side could have been due to the ISB)       Anesthesia Quick Evaluation

## 2022-07-22 NOTE — Brief Op Note (Signed)
07/22/2022  3:45 PM  PATIENT:  Troy Garrison  81 y.o. male  PRE-OPERATIVE DIAGNOSIS:  Full thickness rotator cuff tear, RCT arthropathy  POST-OPERATIVE DIAGNOSIS:  Full thickness rotator cuff tear, RCT arthropathy  PROCEDURE:  Procedure(s) with comments: REVERSE SHOULDER ARTHROPLASTY (Right) - no block  DePuy Delta Xtend  SURGEON:  Surgeon(s) and Role:    Netta Cedars, MD - Primary  PHYSICIAN ASSISTANT:   ASSISTANTS: [place] Bruns, PA-C   ANESTHESIA:   general plus local  EBL:  400 mL   BLOOD ADMINISTERED:none  DRAINS: none   LOCAL MEDICATIONS USED:  MARCAINE     SPECIMEN:  No Specimen  DISPOSITION OF SPECIMEN:  N/A  COUNTS:  YES  TOURNIQUET:  * No tourniquets in log *  DICTATION: .Other Dictation: Dictation Number [id number]  PLAN OF CARE: Admit for overnight observation  PATIENT DISPOSITION:  PACU - hemodynamically stable.   Delay start of Pharmacological VTE agent (>24hrs) due to surgical blood loss or risk of bleeding: not applicable

## 2022-07-23 DIAGNOSIS — M75101 Unspecified rotator cuff tear or rupture of right shoulder, not specified as traumatic: Secondary | ICD-10-CM | POA: Diagnosis not present

## 2022-07-23 LAB — HEMOGLOBIN AND HEMATOCRIT, BLOOD
HCT: 44.1 % (ref 39.0–52.0)
Hemoglobin: 14.4 g/dL (ref 13.0–17.0)

## 2022-07-23 LAB — BASIC METABOLIC PANEL
Anion gap: 11 (ref 5–15)
BUN: 16 mg/dL (ref 8–23)
CO2: 23 mmol/L (ref 22–32)
Calcium: 9.2 mg/dL (ref 8.9–10.3)
Chloride: 101 mmol/L (ref 98–111)
Creatinine, Ser: 1 mg/dL (ref 0.61–1.24)
GFR, Estimated: >60 mL/min (ref >60)
Glucose, Bld: 239 mg/dL — ABNORMAL HIGH (ref 70–99)
Potassium: 4.7 mmol/L (ref 3.5–5.1)
Sodium: 135 mmol/L (ref 135–145)

## 2022-07-23 NOTE — Evaluation (Signed)
Occupational Therapy Evaluation Patient Details Name: Troy Garrison MRN: [id number] DOB: 11-09-1940 Today's Date: 07/23/2022   History of Present Illness Troy Garrison is a 81 yr old male who is s/p a R reverse shoulder replacement with no subscap repair on 07-22-2022, secondary to a shoulder rotator cuff tear arthropathy.   Clinical Impression     Therapist provided education and instruction to patient with regards to R UE ROM/exercises, shoulder precautions, proper UE positioning, donning upper extremity clothing, recommendations for bathing while maintaining shoulder precautions, use of ice for edema and pain management, and how to correctly donn/doff sling. Patient verbalized understanding and demonstrated understanding as needed. Patient needed no more than min assistance to donn shirt, pants, and shoes, with instruction provided on compensatory strategies to perform self-care tasks. Patient to follow up with MD for further therapy needs.    Recommendations for follow up therapy are one component of a multi-disciplinary discharge planning process, led by the attending physician.  Recommendations may be updated based on patient status, additional functional criteria and insurance authorization.   Follow Up Recommendations  Follow physician's recommendations for discharge plan and follow up therapies    Assistance Recommended at Discharge PRN  Patient can return home with the following Assistance with cooking/housework;Assist for transportation;A little help with bathing/dressing/bathroom    Functional Status Assessment  Patient has had a recent decline in their functional status and demonstrates the ability to make significant improvements in function in a reasonable and predictable amount of time.  Equipment Recommendations  None recommended by OT       Precautions / Restrictions Precautions Precautions: Shoulder Precaution Comments: sling at all times except with ADLs & exercises, R  UE non-weightbearing, okay to perform AROM of elbow, wrist and hand to tolerance, no PROM of R shoulder, no AROM of R shoulder Required Braces or Orthoses: Sling Restrictions Weight Bearing Restrictions: Yes RUE Weight Bearing: Non weight bearing      Mobility     Transfers Overall transfer level: Independent          Balance     Sitting balance-Leahy Scale: Good       Standing balance-Leahy Scale: Good           ADL either performed or assessed with clinical judgement      Vision Patient Visual Report: No change from baseline Additional Comments: he correctly read the time depicted on the wall clock            Pertinent Vitals/Pain Pain Assessment Pain Assessment: No/denies pain     Hand Dominance Right   Extremity/Trunk Assessment Upper Extremity Assessment Upper Extremity Assessment:  (L UE, B LE, and R UE elbow, wrist, and hand AROM WFL. R shoulder not assessed)   Lower Extremity Assessment Lower Extremity Assessment: Overall WFL for tasks assessed       Communication Communication Communication: No difficulties   Cognition Arousal/Alertness: Awake/alert Behavior During Therapy: WFL for tasks assessed/performed Overall Cognitive Status: Within Functional Limits for tasks assessed          General Comments: Oriented x4, able to follow commands without difficulty           Shoulder Instructions Shoulder Instructions Donning/doffing shirt without moving shoulder:  (Patient able to independently verbalize and/or demonstrate) Method for sponge bathing under operated UE:  (Patient able to independently verbalize and/or demonstrate) Donning/doffing sling/immobilizer:  (Patient able to independently verbalize and/or demonstrate) Correct positioning of sling/immobilizer:  (Patient able to independently verbalize and/or demonstrate) ROM for elbow,  wrist and digits of operated UE:  (Patient able to independently verbalize and/or demonstrate) Sling  wearing schedule (on at all times/off for ADL's):  (Patient able to independently verbalize and/or demonstrate) Proper positioning of operated UE when showering:  (Patient able to independently verbalize and/or demonstrate) Dressing change:  (Patient able to independently verbalize and/or demonstrate) Positioning of UE while sleeping:  (Patient able to independently verbalize and/or demonstrate)    Home Living Family/patient expects to be discharged to:: Private residence Living Arrangements: Spouse/significant other   Type of Home: House Home Access: Level entry     Home Layout: One level     Bathroom Shower/Tub: Occupational psychologist: [place]: [place] - single point;Shower seat - built in;Grab bars - tub/shower          Prior Functioning/Environment Prior Level of Function : Independent/Modified Independent             Mobility Comments: He was independent with ambulation. ADLs Comments: He was independent with ADLs, driving, and working.        OT Problem List: Impaired UE functional use          AM-PAC OT "6 Clicks" Daily Activity     Outcome Measure Help from another person eating meals?: None Help from another person taking care of personal grooming?: None Help from another person toileting, which includes using toliet, bedpan, or urinal?: None Help from another person bathing (including washing, rinsing, drying)?: A Little Help from another person to put on and taking off regular upper body clothing?: A Little Help from another person to put on and taking off regular lower body clothing?: None 6 Click Score: 22   End of Session Nurse Communication:  (Nurse cleared the pt for participation in the session)  Activity Tolerance: Patient tolerated treatment well Patient left: in chair;with call bell/phone within reach  OT Visit Diagnosis: Muscle weakness (generalized) (M62.81)                Time: 8413-2440 OT Time  Calculation (min): 23 min Charges:  OT General Charges $OT Visit: 1 Visit OT Evaluation $OT Eval Low Complexity: 1 Low OT Treatments $Self Care/Home Management : 8-22 mins    Troy Garrison, OTR/L 07/23/2022, 11:38 AM

## 2022-07-23 NOTE — Discharge Summary (Signed)
Patient ID: ANOTHY BUFANO MRN: [id number] DOB/AGE: 1940-11-06 81 y.o.  Admit date: 07/22/2022 Discharge date: 07/23/22  Primary Diagnosis: Right shoulder rotator cuff arthropathy Admission Diagnoses: Status post right shoulder reverse total shoulder Past Medical History:  Diagnosis Date   Atrial flutter ([place])    S/P RFCA with Dr. Caryl Comes 6/12   Bursitis of shoulder    Dysrhythmia    no problems with A. flutter since ablation in 2012   GERD (gastroesophageal reflux disease)    Hearing loss, neural    Left ear   Hemorrhoids    History of Guillain-Barre syndrome 2003   Hyperlipidemia    Hypertension    Injury of tendon of long head of biceps    Left axis deviation 1999   Osteoarthritis    Peptic ulcer 1964   with bleed ; transfused   Pre-diabetes    Prediabetes 07/02/2008   Skin cancer    Discharge Diagnoses:   Principal Problem:   S/P shoulder replacement, right  Estimated body mass index is 36.08 kg/m as calculated from the following:   Height as of this encounter: 6' (1.829 m).   Weight as of this encounter: 120.7 kg.  Procedure:  Procedure(s) (LRB): REVERSE SHOULDER ARTHROPLASTY (Right)   Consults: None  HPI: Troy Garrison is a 81 year old male presenting to clinic for chronic right shoulder pain who was assessed and treated conservatively.  Patient failed conservative treatment and was indicated for right reverse total shoulder arthroplasty.  He reported to the hospital on 07/22/2022 for right reverse total shoulder arthroplasty.  He was admitted for overnight monitoring.  Laboratory Data: Admission on 07/22/2022  Component Date Value Ref Range Status   Glucose-Capillary 07/22/2022 134 (H)  70 - 99 mg/dL Final   Glucose reference range applies only to samples taken after fasting for at least 8 hours.   Comment 1 07/22/2022 Notify RN   Final   Hemoglobin 07/23/2022 14.4  13.0 - 17.0 g/dL Final   HCT 07/23/2022 44.1  39.0 - 52.0 % Final   Performed at [facility], [place] [address] [place], [place] [address]   Sodium 07/23/2022 135  135 - 145 mmol/L Final   Potassium 07/23/2022 4.7  3.5 - 5.1 mmol/L Final   Chloride 07/23/2022 101  98 - 111 mmol/L Final   CO2 07/23/2022 23  22 - 32 mmol/L Final   Glucose, Bld 07/23/2022 239 (H)  70 - 99 mg/dL Final   Glucose reference range applies only to samples taken after fasting for at least 8 hours.   BUN 07/23/2022 16  8 - 23 mg/dL Final   Creatinine, Ser 07/23/2022 1.00  0.61 - 1.24 mg/dL Final   Calcium 07/23/2022 9.2  8.9 - 10.3 mg/dL Final   GFR, Estimated 07/23/2022 >60  >60 mL/min Final   Comment: (NOTE) Calculated using the CKD-EPI Creatinine Equation (2021)    Anion gap 07/23/2022 11  5 - 15 Final   Performed at [facility], [place] [address], [place], [place] [address]  Hospital Outpatient Visit on 07/05/2022  Component Date Value Ref Range Status   Glucose-Capillary 07/05/2022 135 (H)  70 - 99 mg/dL Final   Glucose reference range applies only to samples taken after fasting for at least 8 hours.   MRSA, PCR 07/05/2022 NEGATIVE  NEGATIVE Final   Staphylococcus aureus 07/05/2022 NEGATIVE  NEGATIVE Final   Comment: (NOTE) The Xpert SA Assay (FDA approved for NASAL specimens in patients 24 years of age and  older), is one component of a comprehensive surveillance program. It is not intended to diagnose infection nor to guide or monitor treatment. Performed at [facility], [place] [address] [place], [place] [address]    Sodium 07/05/2022 137  135 - 145 mmol/L Final   Potassium 07/05/2022 5.0  3.5 - 5.1 mmol/L Final   Chloride 07/05/2022 103  98 - 111 mmol/L Final   CO2 07/05/2022 25  22 - 32 mmol/L Final   Glucose, Bld 07/05/2022 135 (H)  70 - 99 mg/dL Final   Glucose reference range applies only to samples taken after fasting for at least 8 hours.   BUN 07/05/2022 15  8 - 23 mg/dL Final   Creatinine, Ser 07/05/2022 0.90  0.61 - 1.24  mg/dL Final   Calcium 07/05/2022 9.8  8.9 - 10.3 mg/dL Final   GFR, Estimated 07/05/2022 >60  >60 mL/min Final   Comment: (NOTE) Calculated using the CKD-EPI Creatinine Equation (2021)    Anion gap 07/05/2022 9  5 - 15 Final   Performed at [facility], [place] [address] [place], [place] [address]   WBC 07/05/2022 6.9  4.0 - 10.5 K/uL Final   RBC 07/05/2022 4.81  4.22 - 5.81 MIL/uL Final   Hemoglobin 07/05/2022 15.0  13.0 - 17.0 g/dL Final   HCT 07/05/2022 44.6  39.0 - 52.0 % Final   MCV 07/05/2022 92.7  80.0 - 100.0 fL Final   MCH 07/05/2022 31.2  26.0 - 34.0 pg Final   MCHC 07/05/2022 33.6  30.0 - 36.0 g/dL Final   RDW 07/05/2022 13.1  11.5 - 15.5 % Final   Platelets 07/05/2022 202  150 - 400 K/uL Final   nRBC 07/05/2022 0.0  0.0 - 0.2 % Final   Performed at [facility], [place] [address] [place], [place] [address]   Prothrombin Time 07/05/2022 13.7  11.4 - 15.2 seconds Final   INR 07/05/2022 1.1  0.8 - 1.2 Final   Comment: (NOTE) INR goal varies based on device and disease states. Performed at [facility], [place] [address], [place], [place] [address]   Office Visit on 06/17/2022  Component Date Value Ref Range Status   WBC 06/17/2022 7.7  4.0 - 10.5 K/uL Final   RBC 06/17/2022 5.09  4.22 - 5.81 Mil/uL Final   Platelets 06/17/2022 201.0  150.0 - 400.0 K/uL Final   Hemoglobin 06/17/2022 15.7  13.0 - 17.0 g/dL Final   HCT 06/17/2022 46.8  39.0 - 52.0 % Final   MCV 06/17/2022 92.0  78.0 - 100.0 fl Final   MCHC 06/17/2022 33.5  30.0 - 36.0 g/dL Final   RDW 06/17/2022 14.3  11.5 - 15.5 % Final   Sodium 06/17/2022 133 (L)  135 - 145 mEq/L Final   Potassium 06/17/2022 5.3 No hemolysis seen (H)  3.5 - 5.1 mEq/L Final   Chloride 06/17/2022 98  96 - 112 mEq/L Final   CO2 06/17/2022 26  19 - 32 mEq/L Final   Glucose, Bld 06/17/2022 136 (H)  70 - 99 mg/dL Final   BUN 06/17/2022 16  6 - 23 mg/dL Final   Creatinine, Ser 06/17/2022  0.89  0.40 - 1.50 mg/dL Final   Total Bilirubin 06/17/2022 0.7  0.2 - 1.2 mg/dL Final   Alkaline Phosphatase 06/17/2022 77  39 - 117 U/L Final   AST 06/17/2022 21  0 - 37 U/L Final   ALT 06/17/2022 24  0 - 53 U/L Final  Total Protein 06/17/2022 6.8  6.0 - 8.3 g/dL Final   Albumin 06/17/2022 4.5  3.5 - 5.2 g/dL Final   GFR 06/17/2022 81.73  >60.00 mL/min Final   Calculated using the CKD-EPI Creatinine Equation (2021)   Calcium 06/17/2022 9.4  8.4 - 10.5 mg/dL Final   TSH 06/17/2022 2.29  0.35 - 5.50 uIU/mL Final   PSA 06/17/2022 1.39  0.10 - 4.00 ng/mL Final   Test performed using [employer] PSA Assay, a parmagnetic partical, chemiluminecent immunoassay.   Hgb A1c MFr Bld 06/17/2022 7.0 (H)  4.6 - 6.5 % Final   Glycemic Control Guidelines for People with Diabetes:Non Diabetic:  <6%Goal of Therapy: <7%Additional Action Suggested:  >8%      X-Rays:DG Shoulder Right Port  Result Date: 07/22/2022 CLINICAL DATA:  Right shoulder arthroplasty EXAM: RIGHT SHOULDER - 1 VIEW COMPARISON:  06/20/2022 FINDINGS: Single frontal view of the right shoulder was obtained. Right shoulder arthroplasty is identified in the expected position without signs of acute complication. Postsurgical changes in the overlying soft tissues. Visualized portions of the right chest are clear. IMPRESSION: 1. Unremarkable right shoulder arthroplasty. Electronically Signed   By: Randa Ngo M.D.   On: 07/22/2022 18:42    EKG: Orders placed or performed in visit on 06/17/22   EKG 12-Lead     Resting in a hospital chair in no acute distress.   Right upper Extremity:   INSPECTION & PALPATION: Healing incision with some bloody drainage noted.  No large hematoma noted .  Pressure dressing removed and new pressure dressing applied.    SENSORY: sensation is intact to light touch in:  superficial radial nerve distribution (dorsal first web space) median nerve distribution (tip of index finger)   ulnar nerve  distribution (tip of small finger)    Axillary nerve distribution (lateral shoulder)   ROM: painless passive range of motion of the elbow and wrist.   MOTOR:  + motor posterior interosseous nerve (thumb IP extension) + anterior interosseous nerve (thumb IP flexion, index finger DIP flexion) + radial nerve (wrist extension) + median nerve (palpable firing thenar mass) + ulnar nerve (palpable firing of first dorsal interosseous muscle)    VASCULAR: 2+ radial pulse, brisk capillary refill < 2 sec, fingers warm and well-perfused       Hospital Course: ERRIK MITCHELLE is a 81 y.o. who was admitted to Hospital. They were brought to the operating room on 07/22/2022 and underwent Procedure(s): [place].  Patient tolerated the procedure well and was later transferred to the recovery room and then to the orthopaedic floor for postoperative care.  They were given PO and IV analgesics for pain control following their surgery.  They were given 24 hours of postoperative antibiotics of  Anti-infectives (From admission, onward)    Start     Dose/Rate Route Frequency Ordered Stop   07/22/22 1930  ceFAZolin (ANCEF) IVPB 2g/100 mL premix        2 g 200 mL/hr over 30 Minutes Intravenous Every 6 hours 07/22/22 1710 07/23/22 0704   07/22/22 1115  ceFAZolin (ANCEF) IVPB 3g/100 mL premix        3 g 200 mL/hr over 30 Minutes Intravenous On call to O.R. 07/22/22 1107 07/22/22 1332      and started on DVT prophylaxis in the form of Aspirin.   Patient had an uneventful night on the evening of surgery.  Patient was seen in rounds without significant pain.  He is doing well.  Did note to  have bloody drainage at his incision.  A pressure dressing was reapplied.  We discussed watching this and reapplying a pressure dressing if needed.  If bleeding stops then an Aquacel could be placed which was given to the patient to take home.  We discussed aggressive icing at the site.  Patient was seen in  rounds and was ready to go home.   Diet: Regular diet Activity:NWB right upper extremity, okay for elbow range of motion Follow-up: 2 weeks with Dr. Veverly Fells Disposition - Home Discharged Condition: good   Discharge Instructions     Call MD / Call 911   Complete by: As directed    If you experience chest pain or shortness of breath, CALL 911 and be transported to the hospital emergency room.  If you develope a fever above 101 F, pus (white drainage) or increased drainage or redness at the wound, or calf pain, call your surgeon's office.   Constipation Prevention   Complete by: As directed    Drink plenty of fluids.  Prune juice may be helpful.  You may use a stool softener, such as Colace (over the counter) 100 mg twice a day.  Use MiraLax (over the counter) for constipation as needed.   Diet - low sodium heart healthy   Complete by: As directed    Increase activity slowly as tolerated   Complete by: As directed    Post-operative opioid taper instructions:   Complete by: As directed    POST-OPERATIVE OPIOID TAPER INSTRUCTIONS: It is important to wean off of your opioid medication as soon as possible. If you do not need pain medication after your surgery it is ok to stop day one. Opioids include: Codeine, Hydrocodone(Norco, Vicodin), Oxycodone(Percocet, oxycontin) and hydromorphone amongst others.  Long term and even short term use of opiods can cause: Increased pain response Dependence Constipation Depression Respiratory depression And more.  Withdrawal symptoms can include Flu like symptoms Nausea, vomiting And more Techniques to manage these symptoms Hydrate well Eat regular healthy meals Stay active Use relaxation techniques(deep breathing, meditating, yoga) Do Not substitute Alcohol to help with tapering If you have been on opioids for less than two weeks and do not have pain than it is ok to stop all together.  Plan to wean off of opioids This plan should start  within one week post op of your joint replacement. Maintain the same interval or time between taking each dose and first decrease the dose.  Cut the total daily intake of opioids by one tablet each day Next start to increase the time between doses. The last dose that should be eliminated is the evening dose.         Allergies as of 07/23/2022   No Known Allergies      Medication List     TAKE these medications    aspirin EC 325 MG tablet Take 162.5 mg by mouth daily as needed (when not taking the ibu).   Fish Oil 500 MG Caps Take 500 mg by mouth daily.   ibuprofen 200 MG tablet Commonly known as: ADVIL Take 200-800 mg by mouth every 8 (eight) hours as needed for moderate pain.   lisinopril 10 MG tablet Commonly known as: ZESTRIL Take 1 tablet (10 mg total) by mouth daily.   MELATONIN PO Take 4 tablets by mouth at bedtime.   methocarbamol 750 MG tablet Commonly known as: Robaxin-750 Take 1 tablet (750 mg total) by mouth every 8 (eight) hours as needed for muscle spasms.  multivitamin tablet Take 1 tablet by mouth daily.   oxyCODONE-acetaminophen 5-325 MG tablet Commonly known as: Percocet Take 1 tablet by mouth every 4 (four) hours as needed for severe pain.   oxymetazoline 0.05 % nasal spray Commonly known as: AFRIN Place 1 spray into both nostrils at bedtime.   pravastatin 40 MG tablet Commonly known as: PRAVACHOL Take 0.5 tablets (20 mg total) by mouth daily.        Follow-up Information     Netta Cedars, MD. Go on 08/04/2022.   Specialty: Orthopedic Surgery Why: You are scheduled for first post op appointment on Thursday October 12th at 8:30am. Contact information: [address] [place] [address] [place] [address] [phone]         Netta Cedars, MD. Call in 2 week(s).   Specialty: Orthopedic Surgery Why: call [phone]  for appt in two weeks with Dr Almon Hercules information: [address] [place] [address] [place]  [address] [phone]                 Signed: Jonelle Sidle PA-C Orthopaedic Surgery 07/23/2022, 10:09 AM

## 2022-07-23 NOTE — TOC CM/SW Note (Signed)
  Transition of Care [facility]) Screening Note   Patient Details  Name: Troy Garrison Date of Birth: [birth year]   Transition of Care [facility]) CM/SW Contact:    Ross Ludwig, LCSW Phone Number: 07/23/2022, 10:12 AM    Transition of Care Department [facility]) has reviewed patient and no TOC needs have been identified at this time. We will continue to monitor patient advancement through interdisciplinary progression rounds. If new patient transition needs arise, please place a TOC consult.

## 2022-07-23 NOTE — Plan of Care (Signed)
Pt stable at this time. Pt to d/c home with family. No needs at this time.  

## 2022-07-23 NOTE — Progress Notes (Signed)
Pt stable at time of d/c instructions and education. Pt surgical dressing clean, dry, and intact. No needs at time of d/c.

## 2022-07-25 ENCOUNTER — Encounter (HOSPITAL_COMMUNITY): Payer: Self-pay | Admitting: Orthopedic Surgery

## 2022-07-28 ENCOUNTER — Encounter: Payer: PPO | Admitting: Family Medicine

## 2022-08-04 DIAGNOSIS — Z4789 Encounter for other orthopedic aftercare: Secondary | ICD-10-CM | POA: Diagnosis not present

## 2022-08-30 ENCOUNTER — Other Ambulatory Visit: Payer: Self-pay | Admitting: *Deleted

## 2022-08-30 MED ORDER — PRAVASTATIN SODIUM 40 MG PO TABS: 20.0000 mg | ORAL_TABLET | Freq: Every day | ORAL | 1 refills | Status: DC

## 2022-09-01 DIAGNOSIS — Z4789 Encounter for other orthopedic aftercare: Secondary | ICD-10-CM | POA: Diagnosis not present

## 2022-09-13 ENCOUNTER — Encounter: Payer: Self-pay | Admitting: Family Medicine

## 2022-09-18 NOTE — Telephone Encounter (Signed)
Please advise 

## 2022-09-19 DIAGNOSIS — M25511 Pain in right shoulder: Secondary | ICD-10-CM | POA: Diagnosis not present

## 2022-09-19 DIAGNOSIS — M25551 Pain in right hip: Secondary | ICD-10-CM | POA: Diagnosis not present

## 2022-09-19 DIAGNOSIS — M25611 Stiffness of right shoulder, not elsewhere classified: Secondary | ICD-10-CM | POA: Diagnosis not present

## 2022-09-19 DIAGNOSIS — M5459 Other low back pain: Secondary | ICD-10-CM | POA: Diagnosis not present

## 2022-09-19 NOTE — Telephone Encounter (Signed)
Can we have him schedule a visit to discuss? I do not think Zepbound is at the pharmacies yet.  Algis Greenhouse. [name], MD 09/19/2022 9:33 AM

## 2022-09-21 NOTE — Telephone Encounter (Signed)
See PCP note.

## 2022-10-11 ENCOUNTER — Ambulatory Visit (INDEPENDENT_AMBULATORY_CARE_PROVIDER_SITE_OTHER): Payer: PPO | Admitting: Family Medicine

## 2022-10-11 ENCOUNTER — Encounter: Payer: Self-pay | Admitting: Family Medicine

## 2022-10-11 ENCOUNTER — Other Ambulatory Visit: Payer: Self-pay | Admitting: Family Medicine

## 2022-10-11 VITALS — BP 180/83 | HR 66 | Temp 97.5°F | Ht 72.0 in | Wt 278.0 lb

## 2022-10-11 DIAGNOSIS — E669 Obesity, unspecified: Secondary | ICD-10-CM

## 2022-10-11 DIAGNOSIS — I10 Essential (primary) hypertension: Secondary | ICD-10-CM | POA: Diagnosis not present

## 2022-10-11 DIAGNOSIS — R739 Hyperglycemia, unspecified: Secondary | ICD-10-CM | POA: Diagnosis not present

## 2022-10-11 DIAGNOSIS — R7303 Prediabetes: Secondary | ICD-10-CM | POA: Diagnosis not present

## 2022-10-11 LAB — POCT GLYCOSYLATED HEMOGLOBIN (HGB A1C): Hemoglobin A1C: 6.7 % — AB (ref 4.0–5.6)

## 2022-10-11 MED ORDER — ZEPBOUND 2.5 MG/0.5ML ~~LOC~~ SOAJ: 2.5000 mg | SUBCUTANEOUS | 0 refills | Status: DC

## 2022-10-11 NOTE — Assessment & Plan Note (Signed)
BMI 37.7 today with multiple comorbidities.  He has not had much success with diet and exercise.  He is interested in trying a GLP-1 agonist to help with weight loss.  Given his elevated A1c readings believe this would be a good idea.  He is interested in trying Zepbound.  Discussed with patient this would likely not be covered under insurance however he may be eligible for their discount savings program.  He is aware of side effects of GLP-1 agonist.  We discussed those again today including nausea and constipation.  Will start low-dose 2.5 mg weekly.  He will follow-up with me in 3 to 4 weeks and we can adjust the dose as tolerated.

## 2022-10-11 NOTE — Progress Notes (Signed)
   Troy Garrison is a 81 y.o. male who presents today for an office visit.  Assessment/Plan:   Chronic Problems Addressed Today: Prediabetes A1c today 6.7.  He is interested in starting GLP-1 agonist for weight control.  His A1c is at goal considering his age and other comorbidities however GLP-1 agonist will help reduce A1c as well.  If insurance will not pay for his zepbound would consider prescribing Mounjaro.  We can recheck A1c in 3 to 6 months.  Obesity BMI 37.7 today with multiple comorbidities.  He has not had much success with diet and exercise.  He is interested in trying a GLP-1 agonist to help with weight loss.  Given his elevated A1c readings believe this would be a good idea.  He is interested in trying Zepbound.  Discussed with patient this would likely not be covered under insurance however he may be eligible for their discount savings program.  He is aware of side effects of GLP-1 agonist.  We discussed those again today including nausea and constipation.  Will start low-dose 2.5 mg weekly.  He will follow-up with me in 3 to 4 weeks and we can adjust the dose as tolerated.  HTN (hypertension) Blood pressure elevated today though he tells me his readings are typically in the 130s over 80s at home.  He is on lisinopril 10 mg daily.  Given his good control at home we will not adjust blood pressure meds today.  Hopefully this will improve with weight loss treatment as above.  He will continue monitor at home and check in with [place] in a few weeks via MyChart.     Subjective:  HPI:  See A/P for status of chronic conditions.  Garrison concern today is weight gain.  He is up about 12 pounds over the last few months.  He has not had much success with diet and exercise.  He is interested in trying medication for this.       Objective:  Physical Exam: BP (!) 180/83   Pulse 66   Temp (!) 97.5 F (36.4 C) (Temporal)   Ht 6' (1.829 m)   Wt 278 lb (126.1 kg)   SpO2 99%   BMI 37.70  kg/m   Wt Readings from Last 3 Encounters:  10/11/22 278 lb (126.1 kg)  07/22/22 266 lb (120.7 kg)  07/05/22 266 lb (120.7 kg)    Gen: No acute distress, resting comfortably Neuro: Grossly normal, moves all extremities Psych: Normal affect and thought content      Troy Garrison M. Troy Pain, MD 10/11/2022 11:30 AM

## 2022-10-11 NOTE — Assessment & Plan Note (Addendum)
A1c today 6.7.  He is interested in starting GLP-1 agonist for weight control.  His A1c is at goal considering his age and other comorbidities however GLP-1 agonist will help reduce A1c as well.  If insurance will not pay for his zepbound would consider prescribing Mounjaro.  We can recheck A1c in 3 to 6 months.

## 2022-10-11 NOTE — Assessment & Plan Note (Signed)
Blood pressure elevated today though he tells me his readings are typically in the 130s over 80s at home.  He is on lisinopril 10 mg daily.  Given his good control at home we will not adjust blood pressure meds today.  Hopefully this will improve with weight loss treatment as above.  He will continue monitor at home and check in with [place] in a few weeks via MyChart.

## 2022-10-11 NOTE — Patient Instructions (Signed)
It was very nice to see you today!  We will send in zepbound.  Keep an eye on your blood pressure and let us know if it is persistently elevated.  See me message in a few weeks to let me know how you are doing with the medication and we can increase the dose as needed.  Take care, Dr [name]  PLEASE NOTE:  If you had any lab tests, please let us know if you have not heard back within a few days. You may see your results on mychart before we have a chance to review them but we will give you a call once they are reviewed by [place].   If we ordered any referrals today, please let us know if you have not heard from their office within the next week.   If you had any urgent prescriptions sent in today, please check with the pharmacy within an hour of our visit to make sure the prescription was transmitted appropriately.   Please try these tips to maintain a healthy lifestyle:  Eat at least 3 REAL meals and 1-2 snacks per day.  Aim for no more than 5 hours between eating.  If you eat breakfast, please do so within one hour of getting up.   Each meal should contain half fruits/vegetables, one quarter protein, and one quarter carbs (no bigger than a computer mouse)  Cut down on sweet beverages. This includes juice, soda, and sweet tea.   Drink at least 1 glass of water with each meal and aim for at least 8 glasses per day  Exercise at least 150 minutes every week.

## 2022-10-19 ENCOUNTER — Encounter: Payer: Self-pay | Admitting: Family Medicine

## 2022-10-27 ENCOUNTER — Other Ambulatory Visit: Payer: Self-pay | Admitting: *Deleted

## 2022-10-27 ENCOUNTER — Telehealth: Payer: Self-pay | Admitting: *Deleted

## 2022-10-27 MED ORDER — LISINOPRIL 10 MG PO TABS: 10.0000 mg | ORAL_TABLET | Freq: Every day | ORAL | 3 refills | Status: DC

## 2022-10-27 NOTE — Telephone Encounter (Signed)
(  Key: [id number]) - [id number] Zepbound 2.'5MG'$ /0.5ML pen-injectors Status: Question ResponseCreated: January 4th, 2024 Records faxed to [phone] to be scan on PA to be able to continue with process

## 2022-10-28 ENCOUNTER — Encounter: Payer: Self-pay | Admitting: Family Medicine

## 2022-10-28 ENCOUNTER — Other Ambulatory Visit: Payer: Self-pay | Admitting: *Deleted

## 2022-10-28 MED ORDER — OZEMPIC (0.25 OR 0.5 MG/DOSE) 2 MG/3ML ~~LOC~~ SOPN: 0.2500 mg | PEN_INJECTOR | SUBCUTANEOUS | 1 refills | Status: DC

## 2022-10-28 NOTE — Telephone Encounter (Signed)
Denied on January 4 We have denied your request as the diagnosis submitted is considered to be part of an excluded (not covered) category, medications utilized to promote weight loss, by the [employer] for Medicare and Medicaid Ok to order Ozempic?

## 2022-10-28 NOTE — Telephone Encounter (Signed)
Please see other mychart message.  Troy Garrison. [name], MD 10/28/2022 12:57 PM

## 2022-10-28 NOTE — Telephone Encounter (Signed)
Please advise 

## 2022-10-28 NOTE — Telephone Encounter (Signed)
Denied on January 4 We have denied your request as the diagnosis submitted is considered to be part of an excluded (not covered) category, medications utilized to promote weight loss, by the [employer] for Medicare and Medicaid

## 2022-10-28 NOTE — Telephone Encounter (Signed)
Patient aware Rx send to his pharmacy

## 2022-10-28 NOTE — Telephone Encounter (Signed)
Ok to send in ozempic 0.'25mg'$  weekly for 4 weeks then increase to 0.'5mg'$  weekly.  Algis Greenhouse. [name], MD 10/28/2022 12:57 PM

## 2022-10-31 ENCOUNTER — Telehealth: Payer: Self-pay | Admitting: Family Medicine

## 2022-10-31 NOTE — Telephone Encounter (Signed)
Caller states: - Insurance will cover a 90 day supply of ozempic not a 30 day supply   Caller requests: - PCP send a 90 days supply of ozempic to [facility]

## 2022-11-01 NOTE — Telephone Encounter (Signed)
Please advise 

## 2022-11-02 NOTE — Telephone Encounter (Signed)
Ok with me. Please place any necessary orders. 

## 2022-11-03 ENCOUNTER — Other Ambulatory Visit: Payer: Self-pay | Admitting: *Deleted

## 2022-11-03 MED ORDER — OZEMPIC (0.25 OR 0.5 MG/DOSE) 2 MG/3ML ~~LOC~~ SOPN: 0.2500 mg | PEN_INJECTOR | SUBCUTANEOUS | 1 refills | Status: DC

## 2022-11-03 NOTE — Telephone Encounter (Signed)
Patient states Herbalist will only fill the following RX if it is a 90 day supply.  Patient requests RX for Semaglutide,0.25 or 0.'5MG'$ /DOS, (OZEMPIC, 0.25 OR 0.5 MG/DOSE,) 2 MG/3ML SOPN   Be sent to:  [id number] [place] - [address] Phone: [phone] Fax: [phone]

## 2022-11-03 NOTE — Telephone Encounter (Signed)
Rx resend with 90 day supply

## 2022-11-08 NOTE — Telephone Encounter (Signed)
Patient states [facility] [address] Pt requests Junious Dresser give him a callback.

## 2022-11-11 ENCOUNTER — Other Ambulatory Visit: Payer: Self-pay | Admitting: *Deleted

## 2022-11-11 MED ORDER — OZEMPIC (0.25 OR 0.5 MG/DOSE) 2 MG/3ML ~~LOC~~ SOPN: 0.2500 mg | PEN_INJECTOR | SUBCUTANEOUS | 1 refills | Status: DC

## 2022-11-11 NOTE — Telephone Encounter (Signed)
[facility] [address]

## 2022-11-11 NOTE — Telephone Encounter (Signed)
Patient called for an update on med refill. Would like Semaglutide,0.25 or 0.'5MG'$ /DOS, (OZEMPIC, 0.25 OR 0.5 MG/DOSE,) 2 MG/3ML SOPN   [facility] [address] [facility]   Informed pt that refill requests take 48-72 hours. Pt verbalized understanding but would either like med refilled asap or speak to [facility] directly.

## 2022-11-17 ENCOUNTER — Telehealth: Payer: Self-pay | Admitting: *Deleted

## 2022-11-17 NOTE — Telephone Encounter (Signed)
(  Key: [id number]) - [id number] Ozempic (0.25 or 0.5 MG/DOSE) '2MG'$ [employer] pen-injectors Status: PA Request Sent: January 25th, 20 Waiting for determination

## 2022-11-25 NOTE — Telephone Encounter (Signed)
PA Rx Ozempic  denied

## 2022-12-02 NOTE — Telephone Encounter (Signed)
Troy Garrison with [employer] states she is working on [employer] appeal for [employer]. Requests Clinical information including diagnosis be faxed to Fax# [phone]

## 2022-12-07 NOTE — Telephone Encounter (Signed)
Troy Garrison with [employer] requests Clinical information including diagnosis be faxed to Fax# [phone] regarding [place] Appeal for Ozempic

## 2022-12-07 NOTE — Telephone Encounter (Signed)
Clinical paperwork faxed

## 2022-12-13 ENCOUNTER — Ambulatory Visit (INDEPENDENT_AMBULATORY_CARE_PROVIDER_SITE_OTHER): Payer: PPO | Admitting: Family Medicine

## 2022-12-13 ENCOUNTER — Encounter: Payer: Self-pay | Admitting: Family Medicine

## 2022-12-13 ENCOUNTER — Other Ambulatory Visit (INDEPENDENT_AMBULATORY_CARE_PROVIDER_SITE_OTHER): Payer: PPO

## 2022-12-13 VITALS — BP 157/91 | HR 69 | Temp 98.0°F | Ht 72.0 in | Wt 270.2 lb

## 2022-12-13 DIAGNOSIS — E669 Obesity, unspecified: Secondary | ICD-10-CM

## 2022-12-13 DIAGNOSIS — E1169 Type 2 diabetes mellitus with other specified complication: Secondary | ICD-10-CM | POA: Diagnosis not present

## 2022-12-13 DIAGNOSIS — Z0001 Encounter for general adult medical examination with abnormal findings: Secondary | ICD-10-CM | POA: Diagnosis not present

## 2022-12-13 DIAGNOSIS — R351 Nocturia: Secondary | ICD-10-CM

## 2022-12-13 DIAGNOSIS — Z1211 Encounter for screening for malignant neoplasm of colon: Secondary | ICD-10-CM

## 2022-12-13 DIAGNOSIS — E785 Hyperlipidemia, unspecified: Secondary | ICD-10-CM

## 2022-12-13 DIAGNOSIS — I10 Essential (primary) hypertension: Secondary | ICD-10-CM | POA: Diagnosis not present

## 2022-12-13 LAB — COMPREHENSIVE METABOLIC PANEL
ALT: 25 U/L (ref 0–53)
AST: 31 U/L (ref 0–37)
Albumin: 4.6 g/dL (ref 3.5–5.2)
Alkaline Phosphatase: 77 U/L (ref 39–117)
BUN: 17 mg/dL (ref 6–23)
CO2: 24 mEq/L (ref 19–32)
Calcium: 9.9 mg/dL (ref 8.4–10.5)
Chloride: 100 mEq/L (ref 96–112)
Creatinine, Ser: 0.94 mg/dL (ref 0.40–1.50)
GFR: 77.05 mL/min (ref >60.00)
Glucose, Bld: 97 mg/dL (ref 70–99)
Potassium: 4.8 mEq/L (ref 3.5–5.1)
Sodium: 136 mEq/L (ref 135–145)
Total Bilirubin: 0.7 mg/dL (ref 0.2–1.2)
Total Protein: 7.3 g/dL (ref 6.0–8.3)

## 2022-12-13 LAB — CBC
HCT: 50.3 % (ref 39.0–52.0)
Hemoglobin: 16.9 g/dL (ref 13.0–17.0)
MCHC: 33.6 g/dL (ref 30.0–36.0)
MCV: 90.4 fl (ref 78.0–100.0)
Platelets: 234 10*3/uL (ref 150.0–400.0)
RBC: 5.56 Mil/uL (ref 4.22–5.81)
RDW: 15.1 % (ref 11.5–15.5)
WBC: 6.7 10*3/uL (ref 4.0–10.5)

## 2022-12-13 LAB — LIPID PANEL
Cholesterol: 133 mg/dL (ref 0–200)
HDL: 40.9 mg/dL (ref >39.00)
LDL Cholesterol: 77 mg/dL (ref 0–99)
NonHDL: 92.04
Total CHOL/HDL Ratio: 3
Triglycerides: 74 mg/dL (ref 0.0–149.0)
VLDL: 14.8 mg/dL (ref 0.0–40.0)

## 2022-12-13 LAB — PSA: PSA: 1.55 ng/mL (ref 0.10–4.00)

## 2022-12-13 LAB — HEMOGLOBIN A1C: Hgb A1c MFr Bld: 6.6 % — ABNORMAL HIGH (ref 4.6–6.5)

## 2022-12-13 LAB — TSH: TSH: 2.52 u[IU]/mL (ref 0.35–5.50)

## 2022-12-13 MED ORDER — LISINOPRIL 10 MG PO TABS: 10.0000 mg | ORAL_TABLET | Freq: Two times a day (BID) | ORAL | 3 refills | Status: DC

## 2022-12-13 NOTE — Assessment & Plan Note (Signed)
Check PSa.

## 2022-12-13 NOTE — Progress Notes (Signed)
Chief Complaint:  Troy Garrison is a 82 y.o. male who presents today for his annual comprehensive physical exam.    Assessment/Plan:  Chronic Problems Addressed Today: HTN (hypertension) Mildly elevated today.  Typically well-controlled at home.  He is down about 8 pounds since our last visit and hopefully this will help manage blood pressure at home as well.  Will continue lisinopril 10 mg twice daily as he has been doing.  He will continue to monitor at home.  Recheck in 3 months.  Nocturia Check PSa.   T2DM (type 2 diabetes mellitus) (HCC) Last A1c 6.7.  We started him on Ozempic 0.25 mg weekly.  He has been tolerating well and will be increasing the dose soon once he completes 4 weeks of the lower dose..  Recheck A1c today.  Will probably recheck in 3 to 6 months depending on results today.  Hyperlipidemia Check lipids.  He is on pravastatin 20 mg daily.  Tolerating well.  Obesity Down 8 pounds since last time.  Congratulated patient on weight loss.  He will continue to work on diet and exercise.  We have him on Ozempic as above.  He will be increasing to 0.5 mg weekly soon.   Preventative Healthcare: Check labs.  Vaccines declined due to prior history of Guillain-Barr.  Discussed colon cancer screening.  Discussed that with current recommendations is his preference whether to proceed with further colon cancer screening.  We discussed screening options.  Will order Cologuard.  Last colonoscopy was 10 years ago which was normal.   Patient Counseling(The following topics were reviewed and/or handout was given):  -Nutrition: Stressed importance of moderation in sodium/caffeine intake, saturated fat and cholesterol, caloric balance, sufficient intake of fresh fruits, vegetables, and fiber.  -Stressed the importance of regular exercise.   -Substance Abuse: Discussed cessation/primary prevention of tobacco, alcohol, or other drug use; driving or other dangerous activities under the  influence; availability of treatment for abuse.   -Injury prevention: Discussed safety belts, safety helmets, smoke detector, smoking near bedding or upholstery.   -Sexuality: Discussed sexually transmitted diseases, partner selection, use of condoms, avoidance of unintended pregnancy and contraceptive alternatives.   -Dental health: Discussed importance of regular tooth brushing, flossing, and dental visits.  -Health maintenance and immunizations reviewed. Please refer to Health maintenance section.  Return to care in 1 year for next preventative visit.     Subjective:  HPI:  See A/p for status of chronic conditions.  Patient is here today for annual follow-up.  He has been taking lisinopril 10 mg twice daily.  Home blood pressure readings have been at goal.  Does note that blood pressure is typically elevated in the office.  He also recently started Ozempic 0.25 mg weekly a few weeks ago.  Just completed his third dose.  He had to pay for this out-of-pocket.  Waiting on insurance appeal.     12/13/2022   10:25 AM  Depression screen PHQ 2/9  Decreased Interest 0  Down, Depressed, Hopeless 0  PHQ - 2 Score 0    Health Maintenance Due  Topic Date Due   FOOT EXAM  Never done   OPHTHALMOLOGY EXAM  Never done   Diabetic kidney evaluation - Urine ACR  04/24/2014   Medicare Annual Wellness (AWV)  12/15/2020     ROS: Per HPI, otherwise a complete review of systems was negative.   PMH:  The following were reviewed and entered/updated in epic: Past Medical History:  Diagnosis Date   Atrial  flutter ([place])    S/P RFCA with Dr. Caryl Comes 6/12   Bursitis of shoulder    Dysrhythmia    no problems with A. flutter since ablation in 2012   GERD (gastroesophageal reflux disease)    Hearing loss, neural    Left ear   Hemorrhoids    History of Guillain-Barre syndrome 2003   Hyperlipidemia    Hypertension    Injury of tendon of long head of biceps    Left axis deviation 1999    Osteoarthritis    Peptic ulcer 1964   with bleed ; transfused   Pre-diabetes    Prediabetes 07/02/2008   Skin cancer    Patient Active Problem List   Diagnosis Date Noted   S/P shoulder replacement, right 07/22/2022   H/O total shoulder replacement, left 08/13/2021   Obesity 07/25/2018   Allergic rhinitis 04/28/2014   HTN (hypertension) 08/25/2011   Atrial flutter ([place]) 02/09/2011   BPH (benign prostatic hyperplasia) 07/08/2009   SKIN CANCER, HX OF 07/08/2009   Hyperlipidemia 07/02/2008   GERD 07/02/2008   Nocturia 07/02/2008   T2DM (type 2 diabetes mellitus) ([place]) 07/02/2008   GUILLAIN-BARRE SYNDROME 03/01/2007   Osteoarthritis 03/01/2007   Past Surgical History:  Procedure Laterality Date   ABLATION OF DYSRHYTHMIC FOCUS  2012    for AF;Dr Caryl Comes   colonoscopy with polypectomy  2011   Internal hemorrhoids; [facility]   INGUINAL HERNIA REPAIR  07/16/2020   Procedure: DIAGNOSTIC LAPAROSCOPY, LAPAROSCOPIC ABDOMINAL WALL HERNIA REPAIR WITH MESH;  Surgeon: Alphonsa Overall, MD;  Location: [facility];  Service: General;;   INTRAOCULAR LENS INSERTION     MELANOMA EXCISION     on scalp January 2019   REPLACEMENT TOTAL KNEE  2000   Left Knee   REVERSE SHOULDER ARTHROPLASTY Left 08/13/2021   Procedure: REVERSE SHOULDER ARTHROPLASTY;  Surgeon: Netta Cedars, MD;  Location: [facility];  Service: Orthopedics;  Laterality: Left;  with ISB   REVERSE SHOULDER ARTHROPLASTY Right 07/22/2022   Procedure: REVERSE SHOULDER ARTHROPLASTY;  Surgeon: Netta Cedars, MD;  Location: [facility];  Service: Orthopedics;  Laterality: Right;  no block   REVISION TOTAL SHOULDER TO REVERSE TOTAL SHOULDER Left 09/24/2021   Procedure: Left shoulder revision reverse total shoulder arthroplasty with poly exchange;  Surgeon: Netta Cedars, MD;  Location: [facility];  Service: Orthopedics;  Laterality: Left;   TOTAL KNEE ARTHROPLASTY  09/21/2011   Procedure: TOTAL KNEE ARTHROPLASTY;  Surgeon: Tobi Bastos;  Location: [facility];   Service: Orthopedics;  Laterality: Right;   VASECTOMY     Scar tissue removed 2 years after the vasectomy   WISDOM TOOTH EXTRACTION      Family History  Problem Relation Age of Onset   Heart attack Father 49   Heart failure Father        Died @ 70   Lupus Mother    Heart attack Brother 40       MI @ 84;.CABG @ 71   Diabetes Brother    Prostate cancer Paternal Grandfather        59   Cancer Maternal Uncle        X2, unknown primary   Stroke Maternal Grandmother        > 65   Peripheral vascular disease Brother        S/P leg amputation   Colon cancer Neg Hx    Stomach cancer Neg Hx     Medications- reviewed and updated Current Outpatient Medications  Medication Sig Dispense Refill  aspirin 325 MG EC tablet Take 162.5 mg by mouth daily as needed (when not taking the ibu).     ibuprofen (ADVIL,MOTRIN) 200 MG tablet Take 200-800 mg by mouth every 8 (eight) hours as needed for moderate pain.     MELATONIN PO Take 4 tablets by mouth at bedtime.     Multiple Vitamin (MULTIVITAMIN) tablet Take 1 tablet by mouth daily.     Omega-3 Fatty Acids (FISH OIL) 500 MG CAPS Take 500 mg by mouth daily.     oxymetazoline (AFRIN) 0.05 % nasal spray Place 1 spray into both nostrils at bedtime.     pravastatin (PRAVACHOL) 40 MG tablet Take 0.5 tablets (20 mg total) by mouth daily. 45 tablet 1   Semaglutide,0.25 or 0.5MG/DOS, (OZEMPIC, 0.25 OR 0.5 MG/DOSE,) 2 MG/3ML SOPN Inject 0.25 mg into the skin once a week. for 4 weeks then increase to 0.33m weekly. 6 mL 1   lisinopril (ZESTRIL) 10 MG tablet Take 1 tablet (10 mg total) by mouth 2 (two) times daily. 180 tablet 3   No current facility-administered medications for this visit.    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 occasionally   Drug use: No   Sexual activity: Not Currently  Other Topics Concern   Not on file  Social History Narrative   10 grandchildren    Social Determinants of Health   Financial Resource Strain: Not on file  Food Insecurity: No Food Insecurity (07/22/2022)   Hunger Vital Sign    Worried About Running Out of Food in the Last Year: Never true    Ran Out of Food in the Last Year: Never true  Transportation Needs: No Transportation Needs (07/22/2022)   PRAPARE - THydrologist(Medical): No    Lack of Transportation (Non-Medical): No  Physical Activity: Not on file  Stress: Not on file  Social Connections: Not on file        Objective:  Physical Exam: BP (!) 157/91   Pulse 69   Temp 98 F (36.7 C) (Temporal)   Ht 6' (1.829 m)   Wt 270 lb 3.2 oz (122.6 kg)   SpO2 96%   BMI 36.65 kg/m   Body mass index is 36.65 kg/m. Wt Readings from Last 3 Encounters:  12/13/22 270 lb 3.2 oz (122.6 kg)  10/11/22 278 lb (126.1 kg)  07/22/22 266 lb (120.7 kg)   Gen: NAD, resting comfortably HEENT: TMs normal bilaterally. OP clear. No thyromegaly noted.  CV: RRR with no murmurs appreciated Pulm: NWOB, CTAB with no crackles, wheezes, or rhonchi GI: Normal bowel sounds present. Soft, Nontender, Nondistended. MSK: no edema, cyanosis, or clubbing noted Skin: warm, dry Neuro: CN2-12 grossly intact. Strength 5/5 in upper and lower extremities. Reflexes  symmetric and intact bilaterally.  Psych: Normal affect and thought content     Amijah Timothy M. [name], MD 12/13/2022 11:11 AM

## 2022-12-13 NOTE — Assessment & Plan Note (Signed)
Check lipids.  He is on pravastatin 20 mg daily.  Tolerating well.

## 2022-12-13 NOTE — Assessment & Plan Note (Addendum)
Mildly elevated today.  Typically well-controlled at home.  He is down about 8 pounds since our last visit and hopefully this will help manage blood pressure at home as well.  Will continue lisinopril 10 mg twice daily as he has been doing.  He will continue to monitor at home.  Recheck in 3 months.

## 2022-12-13 NOTE — Assessment & Plan Note (Signed)
Down 8 pounds since last time.  Congratulated patient on weight loss.  He will continue to work on diet and exercise.  We have him on Ozempic as above.  He will be increasing to 0.5 mg weekly soon.

## 2022-12-13 NOTE — Assessment & Plan Note (Signed)
Last A1c 6.7.  We started him on Ozempic 0.25 mg weekly.  He has been tolerating well and will be increasing the dose soon once he completes 4 weeks of the lower dose..  Recheck A1c today.  Will probably recheck in 3 to 6 months depending on results today.

## 2022-12-13 NOTE — Patient Instructions (Signed)
It was very nice to see you today!  Please keep an eye blood pressure and let us know if it is running up at home.  I will refill your lisinopril 10 mg twice daily.  We will get blood work.  We will order Cologuard for colon cancer screening.  Please continue your Ozempic 0.25 mg weekly for a full 4 weeks and then increase to 0.5 mg weekly.  Will probably see you back in about 3 months to recheck your sugar depending on results of your blood work today.  Please come back to see [place] sooner if needed.  Take care, Dr [name]  PLEASE NOTE:  If you had any lab tests, please let us know if you have not heard back within a few days. You may see your results on mychart before we have a chance to review them but we will give you a call once they are reviewed by [place].   If we ordered any referrals today, please let us know if you have not heard from their office within the next week.   If you had any urgent prescriptions sent in today, please check with the pharmacy within an hour of our visit to make sure the prescription was transmitted appropriately.   Please try these tips to maintain a healthy lifestyle:  Eat at least 3 REAL meals and 1-2 snacks per day.  Aim for no more than 5 hours between eating.  If you eat breakfast, please do so within one hour of getting up.   Each meal should contain half fruits/vegetables, one quarter protein, and one quarter carbs (no bigger than a computer mouse)  Cut down on sweet beverages. This includes juice, soda, and sweet tea.   Drink at least 1 glass of water with each meal and aim for at least 8 glasses per day  Exercise at least 150 minutes every week.    Preventive Care 1 Years and Older, Male Preventive care refers to lifestyle choices and visits with your health care provider that can promote health and wellness. Preventive care visits are also called wellness exams. What can I expect for my preventive care visit? Counseling During your  preventive care visit, your health care provider may ask about your: Medical history, including: Past medical problems. Family medical history. History of falls. Current health, including: Emotional well-being. Home life and relationship well-being. Sexual activity. Memory and ability to understand (cognition). Lifestyle, including: Alcohol, nicotine or tobacco, and drug use. Access to firearms. Diet, exercise, and sleep habits. Work and work Statistician. Sunscreen use. Safety issues such as seatbelt and bike helmet use. Physical exam Your health care provider will check your: Height and weight. These may be used to calculate your BMI (body mass index). BMI is a measurement that tells if you are at a healthy weight. Waist circumference. This measures the distance around your waistline. This measurement also tells if you are at a healthy weight and may help predict your risk of certain diseases, such as type 2 diabetes and high blood pressure. Heart rate and blood pressure. Body temperature. Skin for abnormal spots. What immunizations do I need?  Vaccines are usually given at various ages, according to a schedule. Your health care provider will recommend vaccines for you based on your age, medical history, and lifestyle or other factors, such as travel or where you work. What tests do I need? Screening Your health care provider may recommend screening tests for certain conditions. This may include: Lipid and cholesterol levels.  Diabetes screening. This is done by checking your blood sugar (glucose) after you have not eaten for a while (fasting). Hepatitis C test. Hepatitis B test. HIV (human immunodeficiency virus) test. STI (sexually transmitted infection) testing, if you are at risk. Lung cancer screening. Colorectal cancer screening. Prostate cancer screening. Abdominal aortic aneurysm (AAA) screening. You may need this if you are a current or former smoker. Talk with your  health care provider about your test results, treatment options, and if necessary, the need for more tests. Follow these instructions at home: Eating and drinking  Eat a diet that includes fresh fruits and vegetables, whole grains, lean protein, and low-fat dairy products. Limit your intake of foods with high amounts of sugar, saturated fats, and salt. Take vitamin and mineral supplements as recommended by your health care provider. Do not drink alcohol if your health care provider tells you not to drink. If you drink alcohol: Limit how much you have to 0-2 drinks a day. Know how much alcohol is in your drink. In the [place] one drink equals one 12 oz bottle of beer (355 mL), one 5 oz glass of wine (148 mL), or one 1 oz glass of hard liquor (44 mL). Lifestyle Brush your teeth every morning and night with fluoride toothpaste. Floss one time each day. Exercise for at least 30 minutes 5 or more days each week. Do not use any products that contain nicotine or tobacco. These products include cigarettes, chewing tobacco, and vaping devices, such as e-cigarettes. If you need help quitting, ask your health care provider. Do not use drugs. If you are sexually active, practice safe sex. Use a condom or other form of protection to prevent STIs. Take aspirin only as told by your health care provider. Make sure that you understand how much to take and what form to take. Work with your health care provider to find out whether it is safe and beneficial for you to take aspirin daily. Ask your health care provider if you need to take a cholesterol-lowering medicine (statin). Find healthy ways to manage stress, such as: Meditation, yoga, or listening to music. Journaling. Talking to a trusted person. Spending time with friends and family. Safety Always wear your seat belt while driving or riding in a vehicle. Do not drive: If you have been drinking alcohol. Do not ride with someone who has been  drinking. When you are tired or distracted. While texting. If you have been using any mind-altering substances or drugs. Wear a helmet and other protective equipment during sports activities. If you have firearms in your house, make sure you follow all gun safety procedures. Minimize exposure to UV radiation to reduce your risk of skin cancer. What's next? Visit your health care provider once a year for an annual wellness visit. Ask your health care provider how often you should have your eyes and teeth checked. Stay up to date on all vaccines. This information is not intended to replace advice given to you by your health care provider. Make sure you discuss any questions you have with your health care provider. Document Revised: 04/07/2021 Document Reviewed: 04/07/2021 Elsevier Patient Education  [employer].

## 2022-12-15 ENCOUNTER — Telehealth: Payer: Self-pay

## 2022-12-15 NOTE — Telephone Encounter (Signed)
PA Needed for patient Ozempic  Requested through Cover my meds  Tye Savoy (Key: Ethelene Hal) Rx #: [phone] Ozempic (0.25 or 0.5 MG/DOSE) 2MG/3ML pen-injectors Form [employer] Electronic Prior Authorization Form 2017 NCPDP Created 1 day ago Sent to Plan 1 hour ago Plan Response 1 hour ago Submit Clinical Questions   There is a previously denied request on file, please contact [employer] for further assistance

## 2022-12-15 NOTE — Progress Notes (Signed)
Please inform patient of the following:  Hemoglobin about the same as last time.  In the diabetic range.  Hopefully the Ozempic will help with this.  We should have him come back in 3 months to recheck his A1c.  The rest of his labs are all stable and we can recheck in a year.

## 2022-12-23 ENCOUNTER — Encounter: Payer: Self-pay | Admitting: Family Medicine

## 2022-12-23 NOTE — Telephone Encounter (Signed)
See note

## 2022-12-23 NOTE — Telephone Encounter (Signed)
Can we call pharmacy and clarify?  Troy Garrison. [name], MD 12/23/2022 3:33 PM

## 2022-12-24 ENCOUNTER — Encounter: Payer: Self-pay | Admitting: Family Medicine

## 2022-12-26 ENCOUNTER — Other Ambulatory Visit: Payer: Self-pay | Admitting: *Deleted

## 2022-12-26 MED ORDER — OZEMPIC (0.25 OR 0.5 MG/DOSE) 2 MG/3ML ~~LOC~~ SOPN: 0.5000 mg | PEN_INJECTOR | SUBCUTANEOUS | 1 refills | Status: DC

## 2022-12-26 NOTE — Telephone Encounter (Signed)
Ok to send in dose for 0.'5mg'$  weekly.  Algis Greenhouse. [name], MD 12/26/2022 10:46 AM

## 2022-12-26 NOTE — Telephone Encounter (Signed)
Please advise 

## 2022-12-26 NOTE — Telephone Encounter (Signed)
Rx send to pharmacy on 12/13/2022

## 2022-12-26 NOTE — Telephone Encounter (Signed)
Rx send to pharmacy  

## 2022-12-26 NOTE — Telephone Encounter (Signed)
Patient states he is not able to fill medication due to pharmacy stating too soon for refill. I called pharmacy and spoke to [place], pharmacist. [place] stated they have rx order but when they run insurance, insurance says too soon for refill due to med being filled by [facility] on 10/27/22.  Recommended pt call insurance to see what could be done since dosage frequency changed and he is out of meds. Pt verbalized understanding.

## 2022-12-28 ENCOUNTER — Other Ambulatory Visit: Payer: Self-pay | Admitting: *Deleted

## 2022-12-28 MED ORDER — OZEMPIC (0.25 OR 0.5 MG/DOSE) 2 MG/3ML ~~LOC~~ SOPN: 0.5000 mg | PEN_INJECTOR | SUBCUTANEOUS | 1 refills | Status: DC

## 2022-12-30 ENCOUNTER — Telehealth: Payer: Self-pay | Admitting: Family Medicine

## 2022-12-30 NOTE — Telephone Encounter (Signed)
Please advise 

## 2022-12-30 NOTE — Telephone Encounter (Signed)
Troy Garrison from [employer] states only 9 mLs of ozempic (90 days) is covered by insurance when it comes to mail order pharmacies. Requests permission to change dose to this.

## 2023-01-02 ENCOUNTER — Other Ambulatory Visit: Payer: Self-pay

## 2023-01-02 MED ORDER — OZEMPIC (0.25 OR 0.5 MG/DOSE) 2 MG/3ML ~~LOC~~ SOPN: 0.5000 mg | PEN_INJECTOR | SUBCUTANEOUS | 1 refills | Status: DC

## 2023-01-02 NOTE — Telephone Encounter (Signed)
Ok with me. Please place any necessary orders. 

## 2023-01-02 NOTE — Telephone Encounter (Signed)
Refill sent.

## 2023-01-03 DIAGNOSIS — H40013 Open angle with borderline findings, low risk, bilateral: Secondary | ICD-10-CM | POA: Diagnosis not present

## 2023-01-05 ENCOUNTER — Other Ambulatory Visit: Payer: Self-pay | Admitting: Family Medicine

## 2023-01-17 ENCOUNTER — Telehealth: Payer: Self-pay | Admitting: *Deleted

## 2023-01-17 ENCOUNTER — Other Ambulatory Visit: Payer: Self-pay | Admitting: *Deleted

## 2023-01-17 NOTE — Telephone Encounter (Signed)
(  Troy Garrison:[id number] Ozempic (0.25 or 0.5 MG/DOSE) 2MG /3ML pen-injectors Medical Records faxed to [phone]

## 2023-01-17 NOTE — Telephone Encounter (Signed)
Message from Plan Your request has been n/a

## 2023-01-26 ENCOUNTER — Other Ambulatory Visit (HOSPITAL_COMMUNITY): Payer: Self-pay

## 2023-01-26 NOTE — Telephone Encounter (Signed)
Received appeal denial for Ozempic. Resubmitting another appeal due to pt having diabetes. Have included chart notes along with labs showing pt's A1C.   Pharmacy Patient Advocate Encounter  Received notification from [employer] that the request for prior authorization for Ozempic has been denied due to .

## 2023-01-27 ENCOUNTER — Encounter: Payer: Self-pay | Admitting: Family Medicine

## 2023-01-27 ENCOUNTER — Ambulatory Visit (INDEPENDENT_AMBULATORY_CARE_PROVIDER_SITE_OTHER): Payer: PPO | Admitting: Family Medicine

## 2023-01-27 VITALS — BP 157/82 | HR 64 | Temp 98.0°F | Ht 72.0 in | Wt 266.4 lb

## 2023-01-27 DIAGNOSIS — N400 Enlarged prostate without lower urinary tract symptoms: Secondary | ICD-10-CM

## 2023-01-27 DIAGNOSIS — I10 Essential (primary) hypertension: Secondary | ICD-10-CM | POA: Diagnosis not present

## 2023-01-27 DIAGNOSIS — E1169 Type 2 diabetes mellitus with other specified complication: Secondary | ICD-10-CM | POA: Diagnosis not present

## 2023-01-27 MED ORDER — SEMAGLUTIDE (1 MG/DOSE) 4 MG/3ML ~~LOC~~ SOPN: 1.0000 mg | PEN_INJECTOR | SUBCUTANEOUS | 0 refills | Status: DC

## 2023-01-27 NOTE — Patient Instructions (Signed)
It was very nice to see you today!  We will increase your Ozempic to 1 mg weekly.  I will refer you to see a urologist.  Please keep an eye on your blood pressure and let us know if it is persistently elevated.  I will see you back in 2 to 3 months to recheck your A1c.  Please come back to see [place] sooner if needed.  Take care, Dr Jimmey Ralph  PLEASE NOTE:  If you had any lab tests, please let us know if you have not heard back within a few days. You may see your results on mychart before we have a chance to review them but we will give you a call once they are reviewed by [place].   If we ordered any referrals today, please let us know if you have not heard from their office within the next week.   If you had any urgent prescriptions sent in today, please check with the pharmacy within an hour of our visit to make sure the prescription was transmitted appropriately.   Please try these tips to maintain a healthy lifestyle:  Eat at least 3 REAL meals and 1-2 snacks per day.  Aim for no more than 5 hours between eating.  If you eat breakfast, please do so within one hour of getting up.   Each meal should contain half fruits/vegetables, one quarter protein, and one quarter carbs (no bigger than a computer mouse)  Cut down on sweet beverages. This includes juice, soda, and sweet tea.   Drink at least 1 glass of water with each meal and aim for at least 8 glasses per day  Exercise at least 150 minutes every week.

## 2023-01-27 NOTE — Progress Notes (Signed)
   Troy Garrison is a 82 y.o. male who presents today for an office visit.  Assessment/Plan:  Chronic Problems Addressed Today: T2DM (type 2 diabetes mellitus) (HCC) Too early to recheck A1c today.  He is down about 4 pounds since her last visit.  Will increase dose of Ozempic to 1 mg weekly.  He can follow-up with me in a few weeks via MyChart.  He will come back in 2 to 3 months to recheck A1c.  BPH (benign prostatic hyperplasia) Has had slight worsening symptoms since her last visit.  He would like to see a urologist.  Will place referral today.  HTN (hypertension) Slightly elevated today.  On lisinopril 10 mg twice daily.  Hopefully will have some better control with blood pressure as he is able to lose some weight with above increased dose of Ozempic.  He will continue to monitor at home.  Blood pressure still not at goal over the next several weeks to months will increase dose of lisinopril as needed.     Subjective:  HPI:  See A/p for status of chronic conditions.   Patient is here today for follow-up.  We last saw him about 6 weeks ago for diabetes follow-up.  He is on Ozempic 0.5 mg weekly.  Tolerating well.  No significant side effects.       Objective:  Physical Exam: BP (!) 157/82   Pulse 64   Temp 98 F (36.7 C) (Temporal)   Ht 6' (1.829 m)   Wt 266 lb 6.4 oz (120.8 kg)   SpO2 98%   BMI 36.13 kg/m   Wt Readings from Last 3 Encounters:  01/27/23 266 lb 6.4 oz (120.8 kg)  12/13/22 270 lb 3.2 oz (122.6 kg)  10/11/22 278 lb (126.1 kg)  Gen: No acute distress, resting comfortably CV: Regular rate and rhythm with no murmurs appreciated Pulm: Normal work of breathing, clear to auscultation bilaterally with no crackles, wheezes, or rhonchi Neuro: Grossly normal, moves all extremities Psych: Normal affect and thought content      Kitzia Camus M. Jimmey Ralph, MD 01/27/2023 11:21 AM

## 2023-01-27 NOTE — Assessment & Plan Note (Signed)
Too early to recheck A1c today.  He is down about 4 pounds since her last visit.  Will increase dose of Ozempic to 1 mg weekly.  He can follow-up with me in a few weeks via MyChart.  He will come back in 2 to 3 months to recheck A1c.

## 2023-01-27 NOTE — Assessment & Plan Note (Signed)
Slightly elevated today.  On lisinopril 10 mg twice daily.  Hopefully will have some better control with blood pressure as he is able to lose some weight with above increased dose of Ozempic.  He will continue to monitor at home.  Blood pressure still not at goal over the next several weeks to months will increase dose of lisinopril as needed.

## 2023-01-27 NOTE — Assessment & Plan Note (Signed)
Has had slight worsening symptoms since her last visit.  He would like to see a urologist.  Will place referral today.

## 2023-02-20 ENCOUNTER — Encounter: Payer: Self-pay | Admitting: Family Medicine

## 2023-02-21 NOTE — Telephone Encounter (Signed)
Please let patient know there is not a 1.5mg  dose. We can go to 2mg  weekly if he is agreeable.  Katina Degree. Jimmey Ralph, MD 02/21/2023 7:44 AM

## 2023-02-22 ENCOUNTER — Other Ambulatory Visit: Payer: Self-pay | Admitting: *Deleted

## 2023-02-22 MED ORDER — SEMAGLUTIDE (2 MG/DOSE) 8 MG/3ML ~~LOC~~ SOPN: 2.0000 mg | PEN_INJECTOR | SUBCUTANEOUS | 1 refills | Status: DC

## 2023-02-22 NOTE — Telephone Encounter (Signed)
Rx [facility]empic [facility]mg  [facility]  Patient notified

## 2023-03-07 ENCOUNTER — Encounter: Payer: Self-pay | Admitting: Family Medicine

## 2023-03-07 ENCOUNTER — Other Ambulatory Visit: Payer: Self-pay | Admitting: *Deleted

## 2023-03-07 MED ORDER — LISINOPRIL 10 MG PO TABS: 10.0000 mg | ORAL_TABLET | Freq: Two times a day (BID) | ORAL | 3 refills | Status: DC

## 2023-03-07 NOTE — Telephone Encounter (Signed)
Rx send to pharmacy  

## 2023-03-07 NOTE — Telephone Encounter (Signed)
Ok to send in new prescription.  Katina Degree. Jimmey Ralph, MD 03/07/2023 2:35 PM

## 2023-03-07 NOTE — Telephone Encounter (Signed)
Please advise 

## 2023-03-09 DIAGNOSIS — Z85828 Personal history of other malignant neoplasm of skin: Secondary | ICD-10-CM | POA: Diagnosis not present

## 2023-03-09 DIAGNOSIS — L821 Other seborrheic keratosis: Secondary | ICD-10-CM | POA: Diagnosis not present

## 2023-03-09 DIAGNOSIS — L570 Actinic keratosis: Secondary | ICD-10-CM | POA: Diagnosis not present

## 2023-03-09 DIAGNOSIS — Z85820 Personal history of malignant melanoma of skin: Secondary | ICD-10-CM | POA: Diagnosis not present

## 2023-03-09 DIAGNOSIS — L905 Scar conditions and fibrosis of skin: Secondary | ICD-10-CM | POA: Diagnosis not present

## 2023-03-09 DIAGNOSIS — D1801 Hemangioma of skin and subcutaneous tissue: Secondary | ICD-10-CM | POA: Diagnosis not present

## 2023-03-27 ENCOUNTER — Other Ambulatory Visit: Payer: Self-pay | Admitting: *Deleted

## 2023-03-27 ENCOUNTER — Encounter: Payer: Self-pay | Admitting: Family Medicine

## 2023-03-27 MED ORDER — SEMAGLUTIDE (2 MG/DOSE) 8 MG/3ML ~~LOC~~ SOPN: 2.0000 mg | PEN_INJECTOR | SUBCUTANEOUS | 1 refills | Status: DC

## 2023-03-27 NOTE — Telephone Encounter (Signed)
Rx send to pharmacy  

## 2023-03-29 ENCOUNTER — Other Ambulatory Visit: Payer: Self-pay | Admitting: *Deleted

## 2023-03-29 MED ORDER — SEMAGLUTIDE (2 MG/DOSE) 8 MG/3ML ~~LOC~~ SOPN: 2.0000 mg | PEN_INJECTOR | SUBCUTANEOUS | 1 refills | Status: DC

## 2023-03-29 NOTE — Telephone Encounter (Signed)
Patient requests to be called re: RX for Ozempic was sent to wrong Pharmacy.  See previous message [facility] [address].  Note: Patient states he is self pay for this medication

## 2023-03-30 NOTE — Telephone Encounter (Signed)
PA approved  Message from Plan 05-JUN-24:05-JUN-25 Ozempic (2 MG/DOSE) 8MG /3ML Rupert SOPN Quantity:3

## 2023-04-03 ENCOUNTER — Telehealth: Payer: Self-pay | Admitting: *Deleted

## 2023-04-03 NOTE — Telephone Encounter (Signed)
Pharmacy call requesting VO for Rx Ozempic #90 due to insurance preference and Dose verification  Dose verification given, #90 approved

## 2023-04-25 ENCOUNTER — Other Ambulatory Visit: Payer: Self-pay | Admitting: *Deleted

## 2023-04-25 MED ORDER — PRAVASTATIN SODIUM 40 MG PO TABS: 20.0000 mg | ORAL_TABLET | Freq: Every day | ORAL | 0 refills | Status: DC

## 2023-05-09 ENCOUNTER — Ambulatory Visit: Payer: PPO | Admitting: Family Medicine

## 2023-05-09 ENCOUNTER — Encounter: Payer: Self-pay | Admitting: Family Medicine

## 2023-05-09 VITALS — BP 124/74 | HR 72 | Temp 97.5°F | Ht 72.0 in | Wt 254.0 lb

## 2023-05-09 DIAGNOSIS — I10 Essential (primary) hypertension: Secondary | ICD-10-CM | POA: Diagnosis not present

## 2023-05-09 DIAGNOSIS — Z7985 Long-term (current) use of injectable non-insulin antidiabetic drugs: Secondary | ICD-10-CM | POA: Diagnosis not present

## 2023-05-09 DIAGNOSIS — E1169 Type 2 diabetes mellitus with other specified complication: Secondary | ICD-10-CM | POA: Diagnosis not present

## 2023-05-09 LAB — POCT GLYCOSYLATED HEMOGLOBIN (HGB A1C): Hemoglobin A1C: 5.6 % (ref 4.0–5.6)

## 2023-05-09 MED ORDER — SEMAGLUTIDE (2 MG/DOSE) 8 MG/3ML ~~LOC~~ SOPN: 2.0000 mg | PEN_INJECTOR | SUBCUTANEOUS | 1 refills | Status: DC

## 2023-05-09 MED ORDER — LISINOPRIL 10 MG PO TABS: 10.0000 mg | ORAL_TABLET | Freq: Every day | ORAL | Status: DC

## 2023-05-09 NOTE — Progress Notes (Signed)
   Troy Garrison is a 82 y.o. male who presents today for an office visit.  Assessment/Plan:  Chronic Problems Addressed Today: T2DM (type 2 diabetes mellitus) (HCC) A1c improved to 5.6.  He is down about 12 pounds since her last visit.  Congratulated patient on A1c reduction and weight loss.  Continue Ozempic 2 mg weekly.  We will recheck A1c when he comes back for CPE in about 6 months.  HTN (hypertension) Blood pressure at goal today.  He is now only on lisinopril 10 mg daily.  He is doing well with this dose.  Did discuss with patient that we may be able to continue to wean down as he continues to lose weight.  We discussed importance of good hydration.  He will continue to monitor his blood pressure at home and let us know if he has any highs or lows.     Subjective:  HPI:  See A/P for status of chronic conditions.  Patient here for follow-up.  He was seen 3 months ago.  At that time we increased Ozempic to 1 mg weekly. About a month ago we increased Ozempic to 2 mg weekly.  He has done well with this dose adjustment.  He did have an episode of low blood pressure a week ago.  He decrease lisinopril to 10 mg daily.  He has been monitoring blood pressure at home and it has been within goal.       Objective:  Physical Exam: BP 124/74   Pulse 72   Temp (!) 97.5 F (36.4 C) (Temporal)   Ht 6' (1.829 m)   Wt 254 lb (115.2 kg)   SpO2 98%   BMI 34.45 kg/m   Wt Readings from Last 3 Encounters:  05/09/23 254 lb (115.2 kg)  01/27/23 266 lb 6.4 oz (120.8 kg)  12/13/22 270 lb 3.2 oz (122.6 kg)    Gen: No acute distress, resting comfortably CV: Regular rate and rhythm with no murmurs appreciated Pulm: Normal work of breathing, clear to auscultation bilaterally with no crackles, wheezes, or rhonchi Neuro: Grossly normal, moves all extremities Psych: Normal affect and thought content      Bergen Magner M. Jimmey Ralph, MD 05/09/2023 11:03 AM

## 2023-05-09 NOTE — Assessment & Plan Note (Signed)
Blood pressure at goal today.  He is now only on lisinopril 10 mg daily.  He is doing well with this dose.  Did discuss with patient that we may be able to continue to wean down as he continues to lose weight.  We discussed importance of good hydration.  He will continue to monitor his blood pressure at home and let us know if he has any highs or lows.

## 2023-05-09 NOTE — Patient Instructions (Signed)
It was very nice to see you today!  I am glad that you are doing well.  No medication changes today.  Please keep an eye on your blood pressure at home.   Return in about 7 months (around 12/10/2023) for Annual Physical.   Take care, Dr Jimmey Ralph  PLEASE NOTE:  If you had any lab tests, please let us know if you have not heard back within a few days. You may see your results on mychart before we have a chance to review them but we will give you a call once they are reviewed by [place].   If we ordered any referrals today, please let us know if you have not heard from their office within the next week.   If you had any urgent prescriptions sent in today, please check with the pharmacy within an hour of our visit to make sure the prescription was transmitted appropriately.   Please try these tips to maintain a healthy lifestyle:  Eat at least 3 REAL meals and 1-2 snacks per day.  Aim for no more than 5 hours between eating.  If you eat breakfast, please do so within one hour of getting up.   Each meal should contain half fruits/vegetables, one quarter protein, and one quarter carbs (no bigger than a computer mouse)  Cut down on sweet beverages. This includes juice, soda, and sweet tea.   Drink at least 1 glass of water with each meal and aim for at least 8 glasses per day  Exercise at least 150 minutes every week.

## 2023-05-09 NOTE — Assessment & Plan Note (Signed)
A1c improved to 5.6.  He is down about 12 pounds since her last visit.  Congratulated patient on A1c reduction and weight loss.  Continue Ozempic 2 mg weekly.  We will recheck A1c when he comes back for CPE in about 6 months.

## 2023-08-14 ENCOUNTER — Other Ambulatory Visit: Payer: Self-pay | Admitting: *Deleted

## 2023-08-14 ENCOUNTER — Encounter: Payer: Self-pay | Admitting: Family Medicine

## 2023-08-14 MED ORDER — SEMAGLUTIDE (2 MG/DOSE) 8 MG/3ML ~~LOC~~ SOPN: 2.0000 mg | PEN_INJECTOR | SUBCUTANEOUS | 1 refills | Status: DC

## 2023-10-02 DIAGNOSIS — Z13228 Encounter for screening for other metabolic disorders: Secondary | ICD-10-CM | POA: Diagnosis not present

## 2023-10-05 ENCOUNTER — Other Ambulatory Visit: Payer: Self-pay | Admitting: Family Medicine

## 2023-10-05 MED ORDER — PRAVASTATIN SODIUM 40 MG PO TABS: 20.0000 mg | ORAL_TABLET | Freq: Every day | ORAL | 0 refills | Status: DC

## 2023-10-06 ENCOUNTER — Telehealth: Payer: Self-pay | Admitting: Family Medicine

## 2023-10-06 ENCOUNTER — Other Ambulatory Visit: Payer: Self-pay

## 2023-10-06 MED ORDER — PRAVASTATIN SODIUM 40 MG PO TABS: 20.0000 mg | ORAL_TABLET | Freq: Every day | ORAL | 0 refills | Status: DC

## 2023-10-06 NOTE — Telephone Encounter (Signed)
Rx corrected and sent to pharmacy pt requested

## 2023-10-06 NOTE — Telephone Encounter (Signed)
Patient called requesting that [facility] [address] Instead. Please re-send.

## 2023-10-15 ENCOUNTER — Encounter: Payer: Self-pay | Admitting: Pharmacist

## 2023-10-15 NOTE — Progress Notes (Signed)
Pharmacy Quality Measure Review  This patient is appearing on a report for being at risk of failing the adherence measure for cholesterol (statin) medications this calendar year.   Medication: pravastatin 40 mg Last fill date: 12/13 for 90 day supply  Insurance report was not up to date. No action needed at this time.   Jarrett Ables, PharmD PGY-1 Pharmacy Resident

## 2023-11-06 ENCOUNTER — Encounter: Payer: Self-pay | Admitting: Family Medicine

## 2023-11-07 ENCOUNTER — Other Ambulatory Visit: Payer: Self-pay | Admitting: *Deleted

## 2023-11-07 MED ORDER — SEMAGLUTIDE (2 MG/DOSE) 8 MG/3ML ~~LOC~~ SOPN: 2.0000 mg | PEN_INJECTOR | SUBCUTANEOUS | 1 refills | Status: DC

## 2023-12-16 ENCOUNTER — Other Ambulatory Visit: Payer: Self-pay | Admitting: Family Medicine

## 2024-01-02 ENCOUNTER — Other Ambulatory Visit: Payer: Self-pay | Admitting: Family Medicine

## 2024-01-02 ENCOUNTER — Ambulatory Visit (INDEPENDENT_AMBULATORY_CARE_PROVIDER_SITE_OTHER)

## 2024-01-02 VITALS — Ht 72.0 in | Wt 247.0 lb

## 2024-01-02 DIAGNOSIS — Z0000 Encounter for general adult medical examination without abnormal findings: Secondary | ICD-10-CM | POA: Diagnosis not present

## 2024-01-02 NOTE — Addendum Note (Signed)
 Addended by: Marzella Schlein on: 01/02/2024 03:13 PM   Modules accepted: Orders, Level of Service

## 2024-01-02 NOTE — Progress Notes (Addendum)
 Subjective:   Troy Garrison is a 83 y.o. who presents for a Medicare Wellness preventive visit.  Visit Complete: Virtual I connected with  Troy Garrison on 01/02/24 by a audio enabled telemedicine application and verified that I am speaking with the correct person using two identifiers.  Patient Location: Home  Provider Location: Office/Clinic  I discussed the limitations of evaluation and management by telemedicine. The patient expressed understanding and agreed to proceed.  Vital Signs: Because this visit was a virtual/telehealth visit, some criteria may be missing or patient reported. Any vitals not documented were not able to be obtained and vitals that have been documented are patient reported.  VideoDeclined- This patient declined Librarian, academic. Therefore the visit was completed with audio only.  AWV Questionnaire: No: Patient Medicare AWV questionnaire was not completed prior to this visit.  Cardiac Risk Factors include: advanced age (>64men, >70 women);dyslipidemia;diabetes mellitus;male gender;hypertension;obesity (BMI >30kg/m2)     Objective:    Today's Vitals   01/02/24 1436  Weight: 247 lb (112 kg)  Height: 6' (1.829 m)   Body mass index is 33.5 kg/m.     01/02/2024    2:39 PM 07/22/2022    5:24 PM 07/05/2022   11:27 AM 09/24/2021    2:05 PM 09/22/2021    2:30 PM 08/13/2021    6:00 PM 08/10/2021    1:04 PM  Advanced Directives  Does Patient Have a Medical Advance Directive? Yes No Yes Yes Yes Yes Yes  Type of Estate agent of [place];Living will   Healthcare Power of [place];Living will Healthcare Power of [place];Living will Healthcare Power of [place];Living will Living will;Healthcare Power of Attorney  Does patient want to make changes to medical advance directive?  No - Patient declined No - Patient declined   No - Patient declined   Copy of Healthcare Power of Attorney in Chart? No - copy  requested   No - copy requested     Would patient like information on creating a medical advance directive?  No - Patient declined         Current Medications (verified) Outpatient Encounter Medications as of 01/02/2024  Medication Sig   aspirin 325 MG EC tablet Take 162.5 mg by mouth daily as needed (when not taking the ibu).   ibuprofen (ADVIL,MOTRIN) 200 MG tablet Take 200-800 mg by mouth every 8 (eight) hours as needed for moderate pain.   lisinopril (ZESTRIL) 10 MG tablet TAKE 1 TABLET BY MOUTH TWICE A DAY   MELATONIN PO Take 4 tablets by mouth at bedtime.   Multiple Vitamin (MULTIVITAMIN) tablet Take 1 tablet by mouth daily.   Omega-3 Fatty Acids (FISH OIL) 500 MG CAPS Take 500 mg by mouth daily.   oxymetazoline (AFRIN) 0.05 % nasal spray Place 1 spray into both nostrils at bedtime.   pravastatin (PRAVACHOL) 40 MG tablet TAKE 1/2 TABLET BY MOUTH DAILY   Semaglutide, 2 MG/DOSE, 8 MG/3ML SOPN Inject 2 mg as directed once a week.   No facility-administered encounter medications on file as of 01/02/2024.    Allergies (verified) Patient has no known allergies.   History: Past Medical History:  Diagnosis Date   Atrial flutter (HCC)    S/P RFCA with Dr. Graciela Husbands 6/12   Bursitis of shoulder    Dysrhythmia    no problems with A. flutter since ablation in 2012   GERD (gastroesophageal reflux disease)    Hearing loss, neural    Left ear  Hemorrhoids    History of Guillain-Barre syndrome 2003   Hyperlipidemia    Hypertension    Injury of tendon of long head of biceps    Left axis deviation 1999   Osteoarthritis    Peptic ulcer 1964   with bleed ; transfused   Pre-diabetes    Prediabetes 07/02/2008   Skin cancer    Past Surgical History:  Procedure Laterality Date   ABLATION OF DYSRHYTHMIC FOCUS  2012    for AF;Dr Graciela Husbands   colonoscopy with polypectomy  2011   Internal hemorrhoids; [facility]   INGUINAL HERNIA REPAIR  07/16/2020   Procedure: DIAGNOSTIC LAPAROSCOPY,  LAPAROSCOPIC ABDOMINAL WALL HERNIA REPAIR WITH MESH;  Surgeon: Ovidio Kin, MD;  Location: [facility];  Service: General;;   INTRAOCULAR LENS INSERTION     MELANOMA EXCISION     on scalp January 2019   REPLACEMENT TOTAL KNEE  2000   Left Knee   REVERSE SHOULDER ARTHROPLASTY Left 08/13/2021   Procedure: REVERSE SHOULDER ARTHROPLASTY;  Surgeon: Beverely Low, MD;  Location: [facility];  Service: Orthopedics;  Laterality: Left;  with ISB   REVERSE SHOULDER ARTHROPLASTY Right 07/22/2022   Procedure: REVERSE SHOULDER ARTHROPLASTY;  Surgeon: Beverely Low, MD;  Location: [facility];  Service: Orthopedics;  Laterality: Right;  no block   REVISION TOTAL SHOULDER TO REVERSE TOTAL SHOULDER Left 09/24/2021   Procedure: Left shoulder revision reverse total shoulder arthroplasty with poly exchange;  Surgeon: Beverely Low, MD;  Location: [facility];  Service: Orthopedics;  Laterality: Left;   TOTAL KNEE ARTHROPLASTY  09/21/2011   Procedure: TOTAL KNEE ARTHROPLASTY;  Surgeon: Jacki Cones;  Location: [facility];  Service: Orthopedics;  Laterality: Right;   VASECTOMY     Scar tissue removed 2 years after the vasectomy   WISDOM TOOTH EXTRACTION     Family History  Problem Relation Age of Onset   Heart attack Father 46   Heart failure Father        Died @ 25   Lupus Mother    Heart attack Brother 69       MI @ 31;.CABG @ 62   Diabetes Brother    Prostate cancer Paternal Grandfather        19   Cancer Maternal Uncle        X2, unknown primary   Stroke Maternal Grandmother        > 65   Peripheral vascular disease Brother        S/P leg amputation   Colon cancer Neg Hx    Stomach cancer Neg Hx    Social History   Socioeconomic History   Marital status: Married    Spouse name: Not on file   Number of children: 5   Years of education: Not on file   Highest education level: Bachelor's degree (e.g., BA, AB, BS)  Occupational History   Occupation: Retired IT trainer, does some work, 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 occasionally   Drug use: No   Sexual activity: Not Currently  Other Topics Concern   Not on  file  Social History Narrative   10 grandchildren    Social Drivers of Corporate investment banker Strain: Low Risk  (01/02/2024)   Overall Financial Resource Strain (CARDIA)    Difficulty of Paying Living Expenses: Not hard at all  Food Insecurity: No Food Insecurity (01/02/2024)   Hunger Vital Sign    Worried About Running Out of Food in the Last Year: Never true    Ran Out of Food in the Last Year: Never true  Transportation Needs: No Transportation Needs (01/02/2024)   PRAPARE - Administrator, Civil Service (Medical): No    Lack of Transportation (Non-Medical): No  Physical Activity: Sufficiently Active (01/02/2024)   Exercise Vital Sign    Days of Exercise per Week: 6 days    Minutes of Exercise per Session: 30 min  Stress: No Stress Concern Present (01/02/2024)   [employer] of Occupational Health - Occupational Stress Questionnaire    Feeling of Stress : Not at all  Social Connections: Socially Integrated (01/02/2024)   Social Connection and Isolation Panel [NHANES]    Frequency of Communication with Friends and Family: More than three times a week    Frequency of Social Gatherings with Friends and Family: More than three times a week    Attends Religious Services: More than 4 times per year    Active Member of [employer] or Organizations: Yes    Attends Banker Meetings: 1 to 4 times per year    Marital Status: Married    Tobacco Counseling Counseling given: Not Answered Tobacco comments: smoked 1960-1994, up to < 1  ppd    Clinical Intake:  Pre-visit preparation completed: Yes  Pain : No/denies pain     BMI - recorded: 33.5 Nutritional Status: BMI > 30  Obese Diabetes: Yes CBG done?: No Did pt. bring in CBG monitor from home?: No  How often do you need to have someone help you when you read instructions, pamphlets, or other written materials from your doctor or pharmacy?: 1 - Never  Interpreter Needed?: No  Information entered by :: Lanier Ensign, LPN   Activities of Daily Living     01/02/2024    2:37 PM  In your present state of health, do you have any difficulty performing the following activities:  Hearing? 1  Comment hearing aids  Vision? 0  Difficulty concentrating or making decisions? 0  Walking or climbing stairs? 0  Dressing or bathing? 0  Doing errands, shopping? 0  Preparing Food and eating ? N  Using the Toilet? N  In the past six months, have you accidently leaked urine? N  Do you have problems with loss of bowel control? N  Managing your Medications? N  Managing your Finances? N  Housekeeping or managing your Housekeeping? N    Patient Care Team: Ardith Dark, MD as PCP - General (Family Medicine) Ranee Gosselin, MD as Consulting Physician (Orthopedic Surgery) Christia Reading, MD as Consulting Physician (Otolaryngology)  Indicate any recent Medical Services you may have received from other than [facility] providers in the past year (date may be approximate).     Assessment:   This is a routine wellness examination for [place].  Hearing/Vision screen Hearing Screening - Comments:: Pt has hearing aids  Vision Screening - Comments:: [facility] for annual eye exams pt stated he has to make an up coming appt    Goals Addressed             This Visit's  Progress    Patient Stated       Maintain health and activity        Depression Screen     01/02/2024    2:40 PM 05/09/2023   10:41 AM 01/27/2023   10:43 AM 12/13/2022   10:25 AM 10/11/2022   10:55 AM  06/17/2022    9:37 AM 07/27/2021   10:05 AM  PHQ 2/9 Scores  PHQ - 2 Score 0 0 0 0 0 0 0    Fall Risk     01/02/2024    2:41 PM 05/09/2023   10:41 AM 01/27/2023   10:43 AM 12/13/2022   10:23 AM 10/11/2022   10:55 AM  Fall Risk   Falls in the past year? 0 0 0 0   Number falls in past yr: 0 0 0 0 0  Injury with Fall? 0 0 0 0 0  Risk for fall due to : No Fall Risks  No Fall Risks No Fall Risks No Fall Risks  Follow up Falls prevention discussed        MEDICARE RISK AT HOME:  Medicare Risk at Home Any stairs in or around the home?: Yes If so, are there any without handrails?: No Home free of loose throw rugs in walkways, pet beds, electrical cords, etc?: Yes Adequate lighting in your home to reduce risk of falls?: Yes Life alert?: No Use of a cane, walker or w/c?: No Grab bars in the bathroom?: No Shower chair or bench in shower?: No Elevated toilet seat or a handicapped toilet?: No  TIMED UP AND GO:  Was the test performed?  No  Cognitive Function: 6CIT completed        01/02/2024    2:42 PM  6CIT Screen  What Year? 0 points  What month? 0 points  What time? 0 points  Count back from 20 0 points  Months in reverse --  Repeat phrase 0 points    Immunizations Immunization History  Administered Date(s) Administered   PFIZER Comirnaty(Gray Top)Covid-19 Tri-Sucrose Vaccine 03/15/2021   PFIZER(Purple Top)SARS-COV-2 Vaccination 11/05/2019, 11/25/2019, 08/03/2020   Pfizer(Comirnaty)Fall Seasonal Vaccine 12 years and older 09/20/2022, 06/30/2023   Tdap 02/20/2006    Screening Tests Health Maintenance  Topic Date Due   FOOT EXAM  Never done   OPHTHALMOLOGY EXAM  Never done   Diabetic kidney evaluation - Urine ACR  04/24/2014   HEMOGLOBIN A1C  11/09/2023   Diabetic kidney evaluation - eGFR measurement  12/14/2023   Medicare Annual Wellness (AWV)  01/01/2025   HPV VACCINES  Aged Out   DTaP/Tdap/Td  Discontinued   Pneumonia Vaccine 77+ Years old  Discontinued    Colonoscopy  Discontinued   COVID-19 Vaccine  Discontinued   Zoster Vaccines- Shingrix  Discontinued    Health Maintenance  Health Maintenance Due  Topic Date Due   FOOT EXAM  Never done   OPHTHALMOLOGY EXAM  Never done   Diabetic kidney evaluation - Urine ACR  04/24/2014   HEMOGLOBIN A1C  11/09/2023   Diabetic kidney evaluation - eGFR measurement  12/14/2023   Health Maintenance Items Addressed: See Nurse Notes  Additional Screening:  Vision Screening: Recommended annual ophthalmology exams for early detection of glaucoma and other disorders of the eye.  Dental Screening: Recommended annual dental exams for proper oral hygiene  Community Resource Referral / Chronic Care Management: CRR required this visit?  No   CCM required this visit?  No     Plan:     I have  personally reviewed and noted the following in the patient's chart:   Medical and social history Use of alcohol, tobacco or illicit drugs  Current medications and supplements including opioid prescriptions. Patient is not currently taking opioid prescriptions. Functional ability and status Nutritional status Physical activity Advanced directives List of other physicians Hospitalizations, surgeries, and ER visits in previous 12 months Vitals Screenings to include cognitive, depression, and falls Referrals and appointments  In addition, I have reviewed and discussed with patient certain preventive protocols, quality metrics, and best practice recommendations. A written personalized care plan for preventive services as well as general preventive health recommendations were provided to patient.     Marzella Schlein, LPN   1/61/0960   After Visit Summary: (MyChart) Due to this being a telephonic visit, the after visit summary with patients personalized plan was offered to patient via MyChart   Notes: Nothing significant to report at this time.

## 2024-01-02 NOTE — Patient Instructions (Addendum)
 Troy Garrison , Thank you for taking time to come for your Medicare Wellness Visit. I appreciate your ongoing commitment to your health goals. Please review the following plan we discussed and let me know if I can assist you in the future.   Referrals/Orders/Follow-Ups/Clinician Recommendations: maintain health and activity   This is a list of the screening recommended for you and due dates:  Health Maintenance  Topic Date Due   Complete foot exam   Never done   Eye exam for diabetics  Never done   Yearly kidney health urinalysis for diabetes  04/24/2014   Medicare Annual Wellness Visit  12/15/2020   Hemoglobin A1C  11/09/2023   Yearly kidney function blood test for diabetes  12/14/2023   HPV Vaccine  Aged Out   DTaP/Tdap/Td vaccine  Discontinued   Pneumonia Vaccine  Discontinued   Colon Cancer Screening  Discontinued   COVID-19 Vaccine  Discontinued   Zoster (Shingles) Vaccine  Discontinued    Advanced directives: (Copy Requested) Please bring a copy of your health care power of attorney and living will to the office to be added to your chart at your convenience. You can mail to [facility] [address] [place], [place] [address] or email to [email or web address]  Next Medicare Annual Wellness Visit scheduled for next year: Yes  Vaccinations: declines all Influenza vaccine: recommend every Fall Pneumococcal vaccine: recommend once per lifetime Prevnar-20 Tdap vaccine: recommend every 10 years Shingles vaccine: recommend Shingrix which is 2 doses 2-6 months apart and over 90% effective     Covid-19: recommend 2 doses one month apart with a booster 6 months later

## 2024-01-05 ENCOUNTER — Ambulatory Visit: Payer: PPO | Admitting: Family Medicine

## 2024-01-05 ENCOUNTER — Encounter: Payer: Self-pay | Admitting: Family Medicine

## 2024-01-05 VITALS — BP 156/82 | HR 65 | Temp 97.0°F | Ht 72.0 in | Wt 251.4 lb

## 2024-01-05 DIAGNOSIS — Z125 Encounter for screening for malignant neoplasm of prostate: Secondary | ICD-10-CM

## 2024-01-05 DIAGNOSIS — Z7985 Long-term (current) use of injectable non-insulin antidiabetic drugs: Secondary | ICD-10-CM | POA: Diagnosis not present

## 2024-01-05 DIAGNOSIS — E785 Hyperlipidemia, unspecified: Secondary | ICD-10-CM | POA: Diagnosis not present

## 2024-01-05 DIAGNOSIS — N400 Enlarged prostate without lower urinary tract symptoms: Secondary | ICD-10-CM | POA: Diagnosis not present

## 2024-01-05 DIAGNOSIS — Z6834 Body mass index (BMI) 34.0-34.9, adult: Secondary | ICD-10-CM | POA: Diagnosis not present

## 2024-01-05 DIAGNOSIS — Z0000 Encounter for general adult medical examination without abnormal findings: Secondary | ICD-10-CM | POA: Diagnosis not present

## 2024-01-05 DIAGNOSIS — Z0001 Encounter for general adult medical examination with abnormal findings: Secondary | ICD-10-CM

## 2024-01-05 DIAGNOSIS — I10 Essential (primary) hypertension: Secondary | ICD-10-CM

## 2024-01-05 DIAGNOSIS — E1169 Type 2 diabetes mellitus with other specified complication: Secondary | ICD-10-CM | POA: Diagnosis not present

## 2024-01-05 DIAGNOSIS — E669 Obesity, unspecified: Secondary | ICD-10-CM

## 2024-01-05 LAB — COMPREHENSIVE METABOLIC PANEL
ALT: 22 U/L (ref 0–53)
AST: 26 U/L (ref 0–37)
Albumin: 4.6 g/dL (ref 3.5–5.2)
Alkaline Phosphatase: 79 U/L (ref 39–117)
BUN: 16 mg/dL (ref 6–23)
CO2: 25 meq/L (ref 19–32)
Calcium: 9.4 mg/dL (ref 8.4–10.5)
Chloride: 99 meq/L (ref 96–112)
Creatinine, Ser: 0.84 mg/dL (ref 0.40–1.50)
GFR: 82.27 mL/min (ref >60.00)
Glucose, Bld: 96 mg/dL (ref 70–99)
Potassium: 4.9 meq/L (ref 3.5–5.1)
Sodium: 134 meq/L — ABNORMAL LOW (ref 135–145)
Total Bilirubin: 0.8 mg/dL (ref 0.2–1.2)
Total Protein: 6.9 g/dL (ref 6.0–8.3)

## 2024-01-05 LAB — LIPID PANEL
Cholesterol: 125 mg/dL (ref 0–200)
HDL: 40 mg/dL (ref >39.00)
LDL Cholesterol: 73 mg/dL (ref 0–99)
NonHDL: 84.72
Total CHOL/HDL Ratio: 3
Triglycerides: 57 mg/dL (ref 0.0–149.0)
VLDL: 11.4 mg/dL (ref 0.0–40.0)

## 2024-01-05 LAB — CBC
HCT: 46.7 % (ref 39.0–52.0)
Hemoglobin: 15.6 g/dL (ref 13.0–17.0)
MCHC: 33.4 g/dL (ref 30.0–36.0)
MCV: 92 fl (ref 78.0–100.0)
Platelets: 210 10*3/uL (ref 150.0–400.0)
RBC: 5.07 Mil/uL (ref 4.22–5.81)
RDW: 14.3 % (ref 11.5–15.5)
WBC: 6.7 10*3/uL (ref 4.0–10.5)

## 2024-01-05 LAB — MICROALBUMIN / CREATININE URINE RATIO
Creatinine,U: 83.8 mg/dL
Microalb Creat Ratio: 31.8 mg/g — ABNORMAL HIGH (ref 0.0–30.0)
Microalb, Ur: 2.7 mg/dL — ABNORMAL HIGH (ref 0.0–1.9)

## 2024-01-05 LAB — TSH: TSH: 1.79 u[IU]/mL (ref 0.35–5.50)

## 2024-01-05 LAB — PSA, MEDICARE: PSA: 1.94 ng/mL (ref 0.10–4.00)

## 2024-01-05 LAB — HEMOGLOBIN A1C: Hgb A1c MFr Bld: 6 % (ref 4.6–6.5)

## 2024-01-05 NOTE — Progress Notes (Signed)
 Chief Complaint:  Troy Garrison is a 83 y.o. male who presents today for his annual comprehensive physical exam.    Assessment/Plan:  Chronic Problems Addressed Today: HTN (hypertension) Elevated today.  Typically well-controlled.  Well-controlled at home as well.  He will continue lisinopril 10 mg daily.  He will continue to monitor at home and let us know if persistently elevated.  T2DM (type 2 diabetes mellitus) (HCC) Check A1c.  Last A1c was 5.6.  He is doing well with Ozempic 2 mg daily.  We will continue this.  Recheck in 3 to 6 months depending on results today.  Hyperlipidemia Check lipids.  He is on pravastatin 20 mg daily.  Obesity He has had about 7 pounds since last time.  Congratulated patient on weight loss.  He will continue to work on diet and exercise.  Follow-up in 3 to 6 months.  Preventative Healthcare: Check labs.  Vaccines declined due to prior history of Guillain-Barr.  He is up-to-date on COVID vaccines.  No longer needs colon cancer screening due to age.  Patient Counseling(The following topics were reviewed and/or handout was given):  -Nutrition: Stressed importance of moderation in sodium/caffeine intake, saturated fat and cholesterol, caloric balance, sufficient intake of fresh fruits, vegetables, and fiber.  -Stressed the importance of regular exercise.   -Substance Abuse: Discussed cessation/primary prevention of tobacco, alcohol, or other drug use; driving or other dangerous activities under the influence; availability of treatment for abuse.   -Injury prevention: Discussed safety belts, safety helmets, smoke detector, smoking near bedding or upholstery.   -Sexuality: Discussed sexually transmitted diseases, partner selection, use of condoms, avoidance of unintended pregnancy and contraceptive alternatives.   -Dental health: Discussed importance of regular tooth brushing, flossing, and dental visits.  -Health maintenance and immunizations reviewed.  Please refer to Health maintenance section.  Return to care in 1 year for next preventative visit.     Subjective:  HPI:  He has no acute complaints today. See Assessment / plan for status of chronic conditions  Lifestyle Diet: Balanced. Plenty of fruits and vegetables Exercise: Walking 30 minutes per day.      01/05/2024   10:21 AM  Depression screen PHQ 2/9  Decreased Interest 0  Down, Depressed, Hopeless 0  PHQ - 2 Score 0    Health Maintenance Due  Topic Date Due   FOOT EXAM  Never done   OPHTHALMOLOGY EXAM  Never done   Diabetic kidney evaluation - Urine ACR  04/24/2014   HEMOGLOBIN A1C  11/09/2023   Diabetic kidney evaluation - eGFR measurement  12/14/2023     ROS: Per HPI, otherwise a complete review of systems was negative.   PMH:  The following were reviewed and entered/updated in epic: Past Medical History:  Diagnosis Date   Atrial flutter (HCC)    S/P RFCA with Dr. Graciela Husbands 6/12   Bursitis of shoulder    Dysrhythmia    no problems with A. flutter since ablation in 2012   GERD (gastroesophageal reflux disease)    Hearing loss, neural    Left ear   Hemorrhoids    History of Guillain-Barre syndrome 2003   Hyperlipidemia    Hypertension    Injury of tendon of long head of biceps    Left axis deviation 1999   Osteoarthritis    Peptic ulcer 1964   with bleed ; transfused   Pre-diabetes    Prediabetes 07/02/2008   Skin cancer    Patient Active Problem List   Diagnosis  Date Noted   S/P shoulder replacement, right 07/22/2022   H/O total shoulder replacement, left 08/13/2021   Obesity 07/25/2018   Allergic rhinitis 04/28/2014   HTN (hypertension) 08/25/2011   Atrial flutter (HCC) 02/09/2011   BPH (benign prostatic hyperplasia) 07/08/2009   SKIN CANCER, HX OF 07/08/2009   Hyperlipidemia 07/02/2008   GERD 07/02/2008   Nocturia 07/02/2008   T2DM (type 2 diabetes mellitus) (HCC) 07/02/2008   GUILLAIN-BARRE SYNDROME 03/01/2007   Osteoarthritis  03/01/2007   Past Surgical History:  Procedure Laterality Date   ABLATION OF DYSRHYTHMIC FOCUS  2012    for AF;Dr Graciela Husbands   colonoscopy with polypectomy  2011   Internal hemorrhoids; [facility]   INGUINAL HERNIA REPAIR  07/16/2020   Procedure: DIAGNOSTIC LAPAROSCOPY, LAPAROSCOPIC ABDOMINAL WALL HERNIA REPAIR WITH MESH;  Surgeon: Ovidio Kin, MD;  Location: [facility];  Service: General;;   INTRAOCULAR LENS INSERTION     MELANOMA EXCISION     on scalp January 2019   REPLACEMENT TOTAL KNEE  2000   Left Knee   REVERSE SHOULDER ARTHROPLASTY Left 08/13/2021   Procedure: REVERSE SHOULDER ARTHROPLASTY;  Surgeon: Beverely Low, MD;  Location: [facility];  Service: Orthopedics;  Laterality: Left;  with ISB   REVERSE SHOULDER ARTHROPLASTY Right 07/22/2022   Procedure: REVERSE SHOULDER ARTHROPLASTY;  Surgeon: Beverely Low, MD;  Location: [facility];  Service: Orthopedics;  Laterality: Right;  no block   REVISION TOTAL SHOULDER TO REVERSE TOTAL SHOULDER Left 09/24/2021   Procedure: Left shoulder revision reverse total shoulder arthroplasty with poly exchange;  Surgeon: Beverely Low, MD;  Location: [facility];  Service: Orthopedics;  Laterality: Left;   TOTAL KNEE ARTHROPLASTY  09/21/2011   Procedure: TOTAL KNEE ARTHROPLASTY;  Surgeon: Jacki Cones;  Location: [facility];  Service: Orthopedics;  Laterality: Right;   VASECTOMY     Scar tissue removed 2 years after the vasectomy   WISDOM TOOTH EXTRACTION      Family History  Problem Relation Age of Onset   Heart attack Father 45   Heart failure Father        Died @ 37   Lupus Mother    Heart attack Brother 55       MI @ 36;.CABG @ 12   Diabetes Brother    Prostate cancer Paternal Grandfather        26   Cancer Maternal Uncle        X2, unknown primary   Stroke Maternal Grandmother        > 65   Peripheral vascular disease Brother        S/P leg amputation   Colon cancer Neg Hx    Stomach cancer Neg Hx     Medications- reviewed and updated Current  Outpatient Medications  Medication Sig Dispense Refill   aspirin 325 MG EC tablet Take 162.5 mg by mouth daily as needed (when not taking the ibu).     ibuprofen (ADVIL,MOTRIN) 200 MG tablet Take 200-800 mg by mouth every 8 (eight) hours as needed for moderate pain.     lisinopril (ZESTRIL) 10 MG tablet TAKE 1 TABLET BY MOUTH TWICE A DAY 180 tablet 1   MELATONIN PO Take 4 tablets by mouth at bedtime.     Multiple Vitamin (MULTIVITAMIN) tablet Take 1 tablet by mouth daily.     Omega-3 Fatty Acids (FISH OIL) 500 MG CAPS Take 500 mg by mouth daily.     oxymetazoline (AFRIN) 0.05 % nasal spray Place 1 spray into both nostrils  at bedtime.     pravastatin (PRAVACHOL) 40 MG tablet TAKE 1/2 TABLET BY MOUTH DAILY 45 tablet 3   Semaglutide, 2 MG/DOSE, 8 MG/3ML SOPN Inject 2 mg as directed once a week. 9 mL 1   No current facility-administered medications for this visit.    Allergies-reviewed and updated No Known Allergies  Social History   Socioeconomic History   Marital status: Married    Spouse name: Not on file   Number of children: 5   Years of education: Not on file   Highest education level: Bachelor's degree (e.g., BA, AB, BS)  Occupational History   Occupation: Retired IT trainer, does some work, but [place]  Tobacco Use   Smoking status: Former    Current packs/day: 0.00    Types: Cigarettes    Quit date: 10/24/1992    Years since quitting: 31.2   Smokeless tobacco: Never   Tobacco comments:    smoked 1960-1994, up to < 1 ppd  Vaping Use   Vaping status: Never Used  Substance and Sexual Activity   Alcohol use: Yes    Comment: wine. occasionally   Drug use: No   Sexual activity: Not Currently  Other Topics Concern   Not on file  Social History Narrative   10 grandchildren    Social Drivers of Health   Financial Resource Strain: Low Risk  (01/02/2024)   Overall Financial Resource Strain (CARDIA)    Difficulty of Paying Living Expenses: Not hard at all  Food  Insecurity: No Food Insecurity (01/02/2024)   Hunger Vital Sign    Worried About Running Out of Food in the Last Year: Never true    Ran Out of Food in the Last Year: Never true  Transportation Needs: No Transportation Needs (01/02/2024)   PRAPARE - Administrator, Civil Service (Medical): No    Lack of Transportation (Non-Medical): No  Physical Activity: Sufficiently Active (01/02/2024)   Exercise Vital Sign    Days of Exercise per Week: 6 days    Minutes of Exercise per Session: 30 min  Stress: No Stress Concern Present (01/02/2024)   [employer] of Occupational Health - Occupational Stress Questionnaire    Feeling of Stress : Not at all  Social Connections: Socially Integrated (01/02/2024)   Social Connection and Isolation Panel [NHANES]    Frequency of Communication with Friends and Family: More than three times a week    Frequency of Social Gatherings with Friends and Family: More than three times a week    Attends Religious Services: More than 4 times per year    Active Member of [employer] or Organizations: Yes    Attends Banker Meetings: 1 to 4 times per year    Marital Status: Married        Objective:  Physical Exam: BP (!) 156/82   Pulse 65   Temp (!) 97 F (36.1 C) (Temporal)   Ht 6' (1.829 m)   Wt 251 lb 6.4 oz (114 kg)   SpO2 100%   BMI 34.10 kg/m   Body mass index is 34.1 kg/m. Wt Readings from Last 3 Encounters:  01/05/24 251 lb 6.4 oz (114 kg)  01/02/24 247 lb (112 kg)  05/09/23 254 lb (115.2 kg)   Gen: NAD, resting comfortably HEENT: TMs normal bilaterally. OP clear. No thyromegaly noted.  CV: RRR with no murmurs appreciated Pulm: NWOB, CTAB with no crackles, wheezes, or rhonchi GI: Normal bowel sounds present. Soft, Nontender, Nondistended. MSK: no  edema, cyanosis, or clubbing noted Skin: warm, dry Neuro: CN2-12 grossly intact. Strength 5/5 in upper and lower extremities. Reflexes symmetric and intact bilaterally.  Psych:  Normal affect and thought content     Amybeth Sieg M. Jimmey Ralph, MD 01/05/2024 10:43 AM

## 2024-01-05 NOTE — Assessment & Plan Note (Signed)
Check lipids.  He is on pravastatin 20 mg daily.

## 2024-01-05 NOTE — Assessment & Plan Note (Signed)
 Elevated today.  Typically well-controlled.  Well-controlled at home as well.  He will continue lisinopril 10 mg daily.  He will continue to monitor at home and let us know if persistently elevated.

## 2024-01-05 NOTE — Assessment & Plan Note (Signed)
 He has had about 7 pounds since last time.  Congratulated patient on weight loss.  He will continue to work on diet and exercise.  Follow-up in 3 to 6 months.

## 2024-01-05 NOTE — Patient Instructions (Signed)
 It was very nice to see you today!  We we will check blood work today.  I am glad you are doing well.  Please keep up the great work with your diet and exercise.  Return in about 6 months (around 07/07/2024) for Follow Up.   Take care, Dr Jimmey Ralph  PLEASE NOTE:  If you had any lab tests, please let us know if you have not heard back within a few days. You may see your results on mychart before we have a chance to review them but we will give you a call once they are reviewed by [place].   If we ordered any referrals today, please let us know if you have not heard from their office within the next week.   If you had any urgent prescriptions sent in today, please check with the pharmacy within an hour of our visit to make sure the prescription was transmitted appropriately.   Please try these tips to maintain a healthy lifestyle:  Eat at least 3 REAL meals and 1-2 snacks per day.  Aim for no more than 5 hours between eating.  If you eat breakfast, please do so within one hour of getting up.   Each meal should contain half fruits/vegetables, one quarter protein, and one quarter carbs (no bigger than a computer mouse)  Cut down on sweet beverages. This includes juice, soda, and sweet tea.   Drink at least 1 glass of water with each meal and aim for at least 8 glasses per day  Exercise at least 150 minutes every week.     Preventive Care 83 Years and Older, Male Preventive care refers to lifestyle choices and visits with your health care provider that can promote health and wellness. Preventive care visits are also called wellness exams. What can I expect for my preventive care visit? Counseling During your preventive care visit, your health care provider may ask about your: Medical history, including: Past medical problems. Family medical history. History of falls. Current health, including: Emotional well-being. Home life and relationship well-being. Sexual activity. Memory and  ability to understand (cognition). Lifestyle, including: Alcohol, nicotine or tobacco, and drug use. Access to firearms. Diet, exercise, and sleep habits. Work and work Astronomer. Sunscreen use. Safety issues such as seatbelt and bike helmet use. Physical exam Your health care provider will check your: Height and weight. These may be used to calculate your BMI (body mass index). BMI is a measurement that tells if you are at a healthy weight. Waist circumference. This measures the distance around your waistline. This measurement also tells if you are at a healthy weight and may help predict your risk of certain diseases, such as type 2 diabetes and high blood pressure. Heart rate and blood pressure. Body temperature. Skin for abnormal spots. What immunizations do I need?  Vaccines are usually given at various ages, according to a schedule. Your health care provider will recommend vaccines for you based on your age, medical history, and lifestyle or other factors, such as travel or where you work. What tests do I need? Screening Your health care provider may recommend screening tests for certain conditions. This may include: Lipid and cholesterol levels. Diabetes screening. This is done by checking your blood sugar (glucose) after you have not eaten for a while (fasting). Hepatitis C test. Hepatitis B test. HIV (human immunodeficiency virus) test. STI (sexually transmitted infection) testing, if you are at risk. Lung cancer screening. Colorectal cancer screening. Prostate cancer screening. Abdominal aortic aneurysm (AAA)  screening. You may need this if you are a current or former smoker. Talk with your health care provider about your test results, treatment options, and if necessary, the need for more tests. Follow these instructions at home: Eating and drinking  Eat a diet that includes fresh fruits and vegetables, whole grains, lean protein, and low-fat dairy products. Limit your  intake of foods with high amounts of sugar, saturated fats, and salt. Take vitamin and mineral supplements as recommended by your health care provider. Do not drink alcohol if your health care provider tells you not to drink. If you drink alcohol: Limit how much you have to 0-2 drinks a day. Know how much alcohol is in your drink. In the [place] one drink equals one 12 oz bottle of beer (355 mL), one 5 oz glass of wine (148 mL), or one 1 oz glass of hard liquor (44 mL). Lifestyle Brush your teeth every morning and night with fluoride toothpaste. Floss one time each day. Exercise for at least 30 minutes 5 or more days each week. Do not use any products that contain nicotine or tobacco. These products include cigarettes, chewing tobacco, and vaping devices, such as e-cigarettes. If you need help quitting, ask your health care provider. Do not use drugs. If you are sexually active, practice safe sex. Use a condom or other form of protection to prevent STIs. Take aspirin only as told by your health care provider. Make sure that you understand how much to take and what form to take. Work with your health care provider to find out whether it is safe and beneficial for you to take aspirin daily. Ask your health care provider if you need to take a cholesterol-lowering medicine (statin). Find healthy ways to manage stress, such as: Meditation, yoga, or listening to music. Journaling. Talking to a trusted person. Spending time with friends and family. Safety Always wear your seat belt while driving or riding in a vehicle. Do not drive: If you have been drinking alcohol. Do not ride with someone who has been drinking. When you are tired or distracted. While texting. If you have been using any mind-altering substances or drugs. Wear a helmet and other protective equipment during sports activities. If you have firearms in your house, make sure you follow all gun safety procedures. Minimize exposure to  UV radiation to reduce your risk of skin cancer. What's next? Visit your health care provider once a year for an annual wellness visit. Ask your health care provider how often you should have your eyes and teeth checked. Stay up to date on all vaccines. This information is not intended to replace advice given to you by your health care provider. Make sure you discuss any questions you have with your health care provider. Document Revised: 04/07/2021 Document Reviewed: 04/07/2021 Elsevier Patient Education  2024 [employer].

## 2024-01-05 NOTE — Assessment & Plan Note (Signed)
 Check A1c.  Last A1c was 5.6.  He is doing well with Ozempic 2 mg daily.  We will continue this.  Recheck in 3 to 6 months depending on results today.

## 2024-01-06 ENCOUNTER — Encounter: Payer: Self-pay | Admitting: Family Medicine

## 2024-01-08 NOTE — Telephone Encounter (Signed)
 Noted.

## 2024-01-09 ENCOUNTER — Encounter: Payer: Self-pay | Admitting: Family Medicine

## 2024-01-09 NOTE — Progress Notes (Signed)
 He has a little protein in his urine.  This is a common finding for people with diabetes.  His level was very mild.  We should recheck again in 3 months.  If it is still elevated we can adjust some of his medications.  His A1c is at goal at 6.0.  The rest of his labs are all at goal including his cholesterol level.  Do not need to make any changes to his treatment plan at this time.  I would like to see him back in 3 months to recheck his A1c and urine protein test as above.===

## 2024-03-11 DIAGNOSIS — L905 Scar conditions and fibrosis of skin: Secondary | ICD-10-CM | POA: Diagnosis not present

## 2024-03-11 DIAGNOSIS — Z85820 Personal history of malignant melanoma of skin: Secondary | ICD-10-CM | POA: Diagnosis not present

## 2024-03-11 DIAGNOSIS — L821 Other seborrheic keratosis: Secondary | ICD-10-CM | POA: Diagnosis not present

## 2024-03-11 DIAGNOSIS — L814 Other melanin hyperpigmentation: Secondary | ICD-10-CM | POA: Diagnosis not present

## 2024-03-11 DIAGNOSIS — M7130 Other bursal cyst, unspecified site: Secondary | ICD-10-CM | POA: Diagnosis not present

## 2024-03-11 DIAGNOSIS — Z85828 Personal history of other malignant neoplasm of skin: Secondary | ICD-10-CM | POA: Diagnosis not present

## 2024-03-11 DIAGNOSIS — L570 Actinic keratosis: Secondary | ICD-10-CM | POA: Diagnosis not present

## 2024-04-10 DIAGNOSIS — H40013 Open angle with borderline findings, low risk, bilateral: Secondary | ICD-10-CM | POA: Diagnosis not present

## 2024-05-14 ENCOUNTER — Other Ambulatory Visit (HOSPITAL_COMMUNITY): Payer: Self-pay

## 2024-05-14 ENCOUNTER — Telehealth: Payer: Self-pay

## 2024-05-14 NOTE — Telephone Encounter (Signed)
 Pharmacy Patient Advocate Encounter   Received notification from CoverMyMeds that prior authorization for Ozempic  8 is required/requested.   Insurance verification completed.   The patient is insured through [facility] ADVANTAGE/RX ADVANCE .   Per test claim: PA required; PA submitted to above mentioned insurance via CoverMyMeds Key/confirmation #/EOC [id number] Status is pending

## 2024-05-14 NOTE — Telephone Encounter (Signed)
 Pharmacy Patient Advocate Encounter  Received notification from [employer] that Prior Authorization for Ozempic  8 has been APPROVED from 05/14/24 to 05/14/25. Ran test claim, Copay is $0.00 for 84 day supply. This test claim was processed through [facility]- copay amounts may vary at other pharmacies due to pharmacy/plan contracts, or as the patient moves through the different stages of their insurance plan.   PA #/Case ID/Reference #: [id number]

## 2024-05-15 NOTE — Telephone Encounter (Signed)
 Spoke to pt told him Ozempic  has been approved. Received notification from [employer] that Prior Authorization for Ozempic  8 has been APPROVED from 05/14/24 to 05/14/25. Ran test claim, Copay is $0.00 for 84 day supply at [facility] pharmacy may be different at your pharmacy. Pt verbalized understanding.

## 2024-05-20 DIAGNOSIS — I10 Essential (primary) hypertension: Secondary | ICD-10-CM | POA: Diagnosis not present

## 2024-05-20 DIAGNOSIS — H18413 Arcus senilis, bilateral: Secondary | ICD-10-CM | POA: Diagnosis not present

## 2024-05-20 DIAGNOSIS — H26493 Other secondary cataract, bilateral: Secondary | ICD-10-CM | POA: Diagnosis not present

## 2024-05-20 DIAGNOSIS — Z961 Presence of intraocular lens: Secondary | ICD-10-CM | POA: Diagnosis not present

## 2024-05-20 DIAGNOSIS — H26492 Other secondary cataract, left eye: Secondary | ICD-10-CM | POA: Diagnosis not present

## 2024-05-27 DIAGNOSIS — Z961 Presence of intraocular lens: Secondary | ICD-10-CM | POA: Diagnosis not present

## 2024-06-10 DIAGNOSIS — H26491 Other secondary cataract, right eye: Secondary | ICD-10-CM | POA: Diagnosis not present

## 2024-06-18 DIAGNOSIS — Z961 Presence of intraocular lens: Secondary | ICD-10-CM | POA: Diagnosis not present

## 2024-06-21 ENCOUNTER — Other Ambulatory Visit: Payer: Self-pay | Admitting: Family Medicine

## 2024-07-03 ENCOUNTER — Encounter: Payer: Self-pay | Admitting: Family Medicine

## 2024-07-04 ENCOUNTER — Other Ambulatory Visit: Payer: Self-pay | Admitting: *Deleted

## 2024-07-04 MED ORDER — COVID-19 MRNA VACC (MODERNA) 50 MCG/0.5ML IM SUSP: 0.5000 mL | Freq: Once | INTRAMUSCULAR | 0 refills | Status: AC

## 2024-07-09 ENCOUNTER — Encounter: Payer: Self-pay | Admitting: Family Medicine

## 2024-07-09 ENCOUNTER — Ambulatory Visit: Admitting: Family Medicine

## 2024-07-09 VITALS — BP 153/89 | HR 61 | Temp 97.2°F | Ht 72.0 in | Wt 249.2 lb

## 2024-07-09 DIAGNOSIS — Z7985 Long-term (current) use of injectable non-insulin antidiabetic drugs: Secondary | ICD-10-CM

## 2024-07-09 DIAGNOSIS — I152 Hypertension secondary to endocrine disorders: Secondary | ICD-10-CM

## 2024-07-09 DIAGNOSIS — E785 Hyperlipidemia, unspecified: Secondary | ICD-10-CM | POA: Diagnosis not present

## 2024-07-09 DIAGNOSIS — Z1211 Encounter for screening for malignant neoplasm of colon: Secondary | ICD-10-CM

## 2024-07-09 DIAGNOSIS — I10 Essential (primary) hypertension: Secondary | ICD-10-CM

## 2024-07-09 DIAGNOSIS — E1159 Type 2 diabetes mellitus with other circulatory complications: Secondary | ICD-10-CM | POA: Diagnosis not present

## 2024-07-09 DIAGNOSIS — E1169 Type 2 diabetes mellitus with other specified complication: Secondary | ICD-10-CM

## 2024-07-09 LAB — POCT GLYCOSYLATED HEMOGLOBIN (HGB A1C): Hemoglobin A1C: 5.5 % (ref 4.0–5.6)

## 2024-07-09 MED ORDER — PRAVASTATIN SODIUM 20 MG PO TABS: 20.0000 mg | ORAL_TABLET | Freq: Every day | ORAL | 3 refills | Status: AC

## 2024-07-09 NOTE — Progress Notes (Signed)
   Troy Garrison is a 83 y.o. male who presents today for an office visit.  Assessment/Plan:  Chronic Problems Addressed Today: T2DM (type 2 diabetes mellitus) (HCC) A1c well controlled at 5.5.  He is on Ozempic  2 mg weekly.  Tolerating well.  Recheck in 6 months.  Dyslipidemia associated with type 2 diabetes mellitus (HCC) Doing well on pravastatin  20 mg daily.  Will send a new prescription in today so that he does not have to split his pills.  Check lipids in 6 months at CPE.  Hypertension associated with diabetes (HCC) Elevated today.  He has been well-controlled at home.  May have some component of whitecoat hypertension.  He will continue lisinopril  10 mg daily.  Monitor at home and let us  know if persistently elevated.  Recheck in 6 months at CPE.  Preventative health care Will order Cologuard today per patient request.    Subjective:  HPI:  See assessment / plan for status of chronic conditions.   Discussed the use of AI scribe software for clinical note transcription with the patient, who gave verbal consent to proceed.  History of Present Illness Troy Garrison is an 83 year old male who presents for a routine six-month checkup.  His blood sugar levels are well-controlled with a recent A1c of 5.5%.  He inquires about the need for a colonoscopy, noting that his last one was in 2014 at the age of 25. He has previously completed a Cologuard test approximately five years ago but does not recall the exact date. He mentions a history of blood in his urine and a bleeding episode during college, which makes him familiar with the appearance of bloody stool.  His blood pressure tends to rise during office visits, although he monitors it at home.  He is currently taking pravastatin , splitting a 40 mg tablet to achieve a 20 mg dose, and prefers a 20 mg tablet to avoid splitting pills.         Objective:  Physical Exam: BP (!) 153/89   Pulse 61   Temp (!) 97.2 F (36.2  C) (Temporal)   Ht 6' (1.829 m)   Wt 249 lb 3.2 oz (113 kg)   SpO2 99%   BMI 33.80 kg/m   Wt Readings from Last 3 Encounters:  07/09/24 249 lb 3.2 oz (113 kg)  01/05/24 251 lb 6.4 oz (114 kg)  01/02/24 247 lb (112 kg)    Gen: No acute distress, resting comfortably CV: Regular rate and rhythm with no murmurs appreciated Pulm: Normal work of breathing, clear to auscultation bilaterally with no crackles, wheezes, or rhonchi Neuro: Grossly normal, moves all extremities Psych: Normal affect and thought content      Gershom Brobeck M. Kennyth, MD 07/09/2024 11:19 AM

## 2024-07-09 NOTE — Patient Instructions (Signed)
 It was very nice to see you today!  VISIT SUMMARY: You came in for your routine six-month checkup. Your blood sugar levels are well-controlled, and we discussed your blood pressure, cholesterol medication, and colorectal cancer screening options.  YOUR PLAN: HYPERTENSION:  -Recheck your blood pressure before leaving the office. -Continue monitoring your blood pressure at home and report if it remains consistently elevated.  TYPE 2 DIABETES MELLITUS: Your diabetes is well-controlled with an A1c of 5.5%. -Continue your current diabetes management regimen.  HYPERLIPIDEMIA: Your cholesterol is managed with pravastatin , but the current dosing is inconvenient. -Prescribe pravastatin  20 mg tablet to avoid splitting pills.  COLORECTAL CANCER SCREENING: Routine colonoscopy is not recommended at your age, but the Cologuard test is a non-invasive alternative. -Order the Cologuard test to be sent to your home.  Return in about 6 months (around 01/06/2025) for Annual Physical.   Take care, Dr Kennyth  PLEASE NOTE:  If you had any lab tests, please let us  know if you have not heard back within a few days. You may see your results on mychart before we have a chance to review them but we will give you a call once they are reviewed by us .   If we ordered any referrals today, please let us  know if you have not heard from their office within the next week.   If you had any urgent prescriptions sent in today, please check with the pharmacy within an hour of our visit to make sure the prescription was transmitted appropriately.   Please try these tips to maintain a healthy lifestyle:  Eat at least 3 REAL meals and 1-2 snacks per day.  Aim for no more than 5 hours between eating.  If you eat breakfast, please do so within one hour of getting up.   Each meal should contain half fruits/vegetables, one quarter protein, and one quarter carbs (no bigger than a computer mouse)  Cut down on sweet beverages.  This includes juice, soda, and sweet tea.   Drink at least 1 glass of water  with each meal and aim for at least 8 glasses per day  Exercise at least 150 minutes every week.

## 2024-07-09 NOTE — Assessment & Plan Note (Signed)
 Elevated today.  He has been well-controlled at home.  May have some component of whitecoat hypertension.  He will continue lisinopril  10 mg daily.  Monitor at home and let us  know if persistently elevated.  Recheck in 6 months at CPE.

## 2024-07-09 NOTE — Assessment & Plan Note (Signed)
 A1c well controlled at 5.5.  He is on Ozempic  2 mg weekly.  Tolerating well.  Recheck in 6 months.

## 2024-07-09 NOTE — Assessment & Plan Note (Signed)
 Doing well on pravastatin  20 mg daily.  Will send a new prescription in today so that he does not have to split his pills.  Check lipids in 6 months at [facility].

## 2024-07-15 DIAGNOSIS — Z1211 Encounter for screening for malignant neoplasm of colon: Secondary | ICD-10-CM | POA: Diagnosis not present

## 2024-07-18 LAB — COLOGUARD: COLOGUARD: NEGATIVE

## 2024-07-19 ENCOUNTER — Ambulatory Visit: Payer: Self-pay | Admitting: Family Medicine

## 2024-07-19 NOTE — Progress Notes (Signed)
 Great news! Cologuard is negative. He can repeat again in 3 years.

## 2024-09-02 ENCOUNTER — Ambulatory Visit: Admitting: Family

## 2024-09-05 ENCOUNTER — Ambulatory Visit (INDEPENDENT_AMBULATORY_CARE_PROVIDER_SITE_OTHER): Admitting: Family Medicine

## 2024-09-05 VITALS — BP 130/80 | HR 77 | Temp 97.3°F | Ht 72.0 in | Wt 245.6 lb

## 2024-09-05 DIAGNOSIS — J029 Acute pharyngitis, unspecified: Secondary | ICD-10-CM | POA: Diagnosis not present

## 2024-09-05 DIAGNOSIS — E1159 Type 2 diabetes mellitus with other circulatory complications: Secondary | ICD-10-CM

## 2024-09-05 LAB — POCT INFLUENZA A/B
Influenza A, POC: NEGATIVE
Influenza B, POC: NEGATIVE

## 2024-09-05 LAB — POCT RAPID STREP A (OFFICE): Rapid Strep A Screen: POSITIVE — AB

## 2024-09-05 LAB — POC COVID19 BINAXNOW: SARS Coronavirus 2 Ag: NEGATIVE

## 2024-09-05 MED ORDER — AMOXICILLIN-POT CLAVULANATE 875-125 MG PO TABS: 1.0000 | ORAL_TABLET | Freq: Two times a day (BID) | ORAL | 0 refills | Status: DC

## 2024-09-05 NOTE — Patient Instructions (Signed)
 It was very nice to see you today!  VISIT SUMMARY: You came in today with a sore throat and cough that you've had for about a week. You also mentioned extreme fatigue, eye itching, and nasal congestion. A rapid strep test was positive, and you were diagnosed with strep throat, sinusitis, and viral conjunctivitis.  YOUR PLAN: STREP THROAT: You have a bacterial infection in your throat. -Take Augmentin twice daily for 10 days. Complete the full course of antibiotics to reduce the risk of spreading the infection.  SINUSITIS: You have a sinus infection likely due to a preceding viral infection. -Take Augmentin twice daily for 10 days.  VIRAL CONJUNCTIVITIS: You have a viral infection causing itching and watering of your eyes. -Take Augmentin twice daily for 10 days.  Return if symptoms worsen or fail to improve.   Take care, Dr Kennyth  PLEASE NOTE:  If you had any lab tests, please let us  know if you have not heard back within a few days. You may see your results on mychart before we have a chance to review them but we will give you a call once they are reviewed by us .   If we ordered any referrals today, please let us  know if you have not heard from their office within the next week.   If you had any urgent prescriptions sent in today, please check with the pharmacy within an hour of our visit to make sure the prescription was transmitted appropriately.   Please try these tips to maintain a healthy lifestyle:  Eat at least 3 REAL meals and 1-2 snacks per day.  Aim for no more than 5 hours between eating.  If you eat breakfast, please do so within one hour of getting up.   Each meal should contain half fruits/vegetables, one quarter protein, and one quarter carbs (no bigger than a computer mouse)  Cut down on sweet beverages. This includes juice, soda, and sweet tea.   Drink at least 1 glass of water  with each meal and aim for at least 8 glasses per day  Exercise at least 150  minutes every week.

## 2024-09-05 NOTE — Progress Notes (Signed)
   Troy Garrison is a 83 y.o. male who presents today for an office visit.  Assessment/Plan:  New/Acute Problems: Strep pharyngitis / sinusitis Rapid strep positive.  COVID and flu test negative.  Does have some evidence of sinusitis on exam.  No red flags.  Reassuring lung exam.  No signs of systemic illness.  Will start Augmentin twice daily for 10 days.  Encouraged hydration.  He can use over-the-counter meds as needed as well.  We discussed reasons to return to care.  Follow-up as needed.  Chronic Problems Addressed Today: No problem-specific Assessment & Plan notes found for this encounter.     Subjective:  HPI:  See assessment / plan for status of chronic conditions.    Discussed the use of AI scribe software for clinical note transcription with the patient, who gave verbal consent to proceed.  History of Present Illness Troy Garrison is an 83 year old male who presents with sore throat and cough.  He has been experiencing a sore throat and cough for about a week, initially suspecting bronchitis. The cough was initially severe, accompanied by mucus production, and disrupted his sleep, but has since improved with Mucinex. No fevers or chills.  He reports extreme fatigue, often falling asleep at his desk while working. He has been using Mucinex, cough syrup, cough drops, and zinc  to manage his symptoms. He attempted to use leftover ampicillin from a previous prescription but does not recall the dosage.  He experiences eye itching and watering, which did not improve with steroid drops used for two to three days. He also mentions occasional nasal congestion and sinus drainage.  He is concerned about the contagiousness of his condition, particularly regarding his wife, but notes that he has not been around anyone else who is sick.         Objective:  Physical Exam: BP 130/80   Pulse 77   Temp (!) 97.3 F (36.3 C) (Temporal)   Ht 6' (1.829 m)   Wt 245 lb 9.6 oz (111.4  kg)   SpO2 98%   BMI 33.31 kg/m   Gen: No acute distress, resting comfortably HEENT: OP erythematous.  No exudate.  His mucosa erythematous and boggy bilaterally.  TMs clear bilaterally CV: Regular rate and rhythm with no murmurs appreciated Pulm: Normal work of breathing, clear to auscultation bilaterally with no crackles, wheezes, or rhonchi Neuro: Grossly normal, moves all extremities Psych: Normal affect and thought content      Katesha Eichel M. Kennyth, MD 09/05/2024 1:53 PM

## 2024-09-05 NOTE — Assessment & Plan Note (Signed)
At goal today on lisinopril 10mg daily.  

## 2024-10-04 ENCOUNTER — Encounter: Payer: Self-pay | Admitting: Family Medicine

## 2024-10-04 ENCOUNTER — Telehealth: Payer: Self-pay | Admitting: Family Medicine

## 2024-10-04 NOTE — Telephone Encounter (Signed)
 Patient has communicated with office staff at [facility] about his continued cough/congestion.  He will go to UC in the morning for evaluation

## 2024-10-04 NOTE — Telephone Encounter (Unsigned)
 Copied from CRM #[id number]. Topic: Clinical - Medication Refill >> Oct 04, 2024 11:38 AM Macario HERO wrote: Medication: amoxicillin -clavulanate (AUGMENTIN ) 875-125 MG tablet [[id number]]  Has the patient contacted their pharmacy? No (Agent: If no, request that the patient contact the pharmacy for the refill. If patient does not wish to contact the pharmacy document the reason why and proceed with request.) (Agent: If yes, when and what did the pharmacy advise?)  This is the patient's preferred pharmacy:  [id number] [address], [place] - [address] [place] [address] Phone: [phone] Fax: [phone]  Is this the correct pharmacy for this prescription? Yes If no, delete pharmacy and type the correct one.   Has the prescription been filled recently? Yes  Is the patient out of the medication? Yes  Has the patient been seen for an appointment in the last year OR does the patient have an upcoming appointment? Yes  Can we respond through MyChart? No  Agent: Please be advised that Rx refills may take up to 3 business days. We ask that you follow-up with your pharmacy.

## 2024-10-04 NOTE — Telephone Encounter (Signed)
 Called and tried to sche with pcp or any other pcp and he stated he is going to the clinic, he didn't see fit for waiting over the weekend for next available appt. On Wednesday the 17th.

## 2024-10-04 NOTE — Telephone Encounter (Signed)
 Please schedule a office visit with PCP or any open provider

## 2024-10-07 ENCOUNTER — Ambulatory Visit
Admission: EM | Admit: 2024-10-07 | Discharge: 2024-10-07 | Disposition: A | Discharge status: Home / Self Care | Attending: Family Medicine | Admitting: Family Medicine

## 2024-10-07 ENCOUNTER — Ambulatory Visit (INDEPENDENT_AMBULATORY_CARE_PROVIDER_SITE_OTHER)

## 2024-10-07 DIAGNOSIS — J320 Chronic maxillary sinusitis: Secondary | ICD-10-CM | POA: Diagnosis not present

## 2024-10-07 DIAGNOSIS — R051 Acute cough: Secondary | ICD-10-CM

## 2024-10-07 MED ORDER — DOXYCYCLINE HYCLATE 100 MG PO CAPS: 100.0000 mg | ORAL_CAPSULE | Freq: Two times a day (BID) | ORAL | 0 refills | Status: AC

## 2024-10-07 MED ORDER — BENZONATATE 200 MG PO CAPS: 200.0000 mg | ORAL_CAPSULE | Freq: Three times a day (TID) | ORAL | 0 refills | Status: AC | PRN

## 2024-10-07 NOTE — Telephone Encounter (Signed)
Patient currently being seen at [facility].

## 2024-10-07 NOTE — ED Triage Notes (Signed)
 Patient reports seeing PCP on the 13th of November and treated for Strep throat/Sinus Infection and Eye infection. Completed medicines but still have a terrible cough and sinus congestion/pressure. No fever.

## 2024-10-07 NOTE — Discharge Instructions (Addendum)
 You were seen today for continued upper respiratory symptoms.  Your chest xray looks okay, but if the radiologist reads this differently we will call to notify you.  Given your continued symptoms I have sent out another antibiotic, along with cough medication.  If you are not improving or continue with symptoms then please return for re-evaluation.

## 2024-10-07 NOTE — ED Provider Notes (Signed)
 [facility]    CSN: [id number] Arrival date & time: 10/07/24  1015      History   Chief Complaint Chief Complaint  Patient presents with   Cough   Follow-up    HPI Troy Garrison is a 83 y.o. male.    Cough Associated symptoms: rhinorrhea    Patient is here for URI symptoms x 7 weeks.  He did see his pcp on 12/11, strep positive, flu/covid negative.  Given augmentin  x 10 days.  Completed that course.  He feels better than before, but still has a nagging cough, and lots of sinus drainage.   Slight fatigue.  No fevers/chills.  No wheezing or sob.  Taking mucinex, tessalon  perles.        Past Medical History:  Diagnosis Date   Atrial flutter (HCC)    S/P RFCA with Dr. Fernande 6/12   Bursitis of shoulder    Cataract    Dysrhythmia    no problems with A. flutter since ablation in 2012   GERD (gastroesophageal reflux disease)    Hearing loss, neural    Left ear   Hemorrhoids    History of Guillain-Barre syndrome 2003   Hyperlipidemia    Hypertension    Injury of tendon of long head of biceps    Left axis deviation 1999   Osteoarthritis    Peptic ulcer 1964   with bleed ; transfused   Pre-diabetes    Prediabetes 07/02/2008   Skin cancer     Patient Active Problem List   Diagnosis Date Noted   Pain of right hip joint 09/19/2022   S/P shoulder replacement, right 07/22/2022   H/O total shoulder replacement, left 08/13/2021   Obesity 07/25/2018   Allergic rhinitis 04/28/2014   Impacted cerumen 04/15/2014   Hypertension associated with diabetes (HCC) 08/25/2011   Atrial flutter (HCC) 02/09/2011   BPH (benign prostatic hyperplasia) 07/08/2009   SKIN CANCER, HX OF 07/08/2009   Dyslipidemia associated with type 2 diabetes mellitus (HCC) 07/02/2008   GERD 07/02/2008   Nocturia 07/02/2008   T2DM (type 2 diabetes mellitus) (HCC) 07/02/2008   GUILLAIN-BARRE SYNDROME 03/01/2007   Osteoarthritis 03/01/2007    Past Surgical History:   Procedure Laterality Date   ABLATION OF DYSRHYTHMIC FOCUS  2012    for AF;Dr Fernande   APPENDECTOMY  2022   hernia with blockage   colonoscopy with polypectomy  2011   Internal hemorrhoids; [facility]   INGUINAL HERNIA REPAIR  07/16/2020   Procedure: DIAGNOSTIC LAPAROSCOPY, LAPAROSCOPIC ABDOMINAL WALL HERNIA REPAIR WITH MESH;  Surgeon: Ethyl Lenis, MD;  Location: [facility];  Service: General;;   INTRAOCULAR LENS INSERTION     JOINT REPLACEMENT  2000, 2012   knee replacement   MELANOMA EXCISION     on scalp January 2019   REPLACEMENT TOTAL KNEE  2000   Left Knee   REVERSE SHOULDER ARTHROPLASTY Left 08/13/2021   Procedure: REVERSE SHOULDER ARTHROPLASTY;  Surgeon: Kay Kemps, MD;  Location: [facility];  Service: Orthopedics;  Laterality: Left;  with ISB   REVERSE SHOULDER ARTHROPLASTY Right 07/22/2022   Procedure: REVERSE SHOULDER ARTHROPLASTY;  Surgeon: Kay Kemps, MD;  Location: [facility];  Service: Orthopedics;  Laterality: Right;  no block   REVISION TOTAL SHOULDER TO REVERSE TOTAL SHOULDER Left 09/24/2021   Procedure: Left shoulder revision reverse total shoulder arthroplasty with poly exchange;  Surgeon: Kay Kemps, MD;  Location: [facility];  Service: Orthopedics;  Laterality: Left;   TOTAL KNEE ARTHROPLASTY  09/21/2011  Procedure: TOTAL KNEE ARTHROPLASTY;  Surgeon: Tanda DELENA Heading;  Location: [facility];  Service: Orthopedics;  Laterality: Right;   VASECTOMY     Scar tissue removed 2 years after the vasectomy   WISDOM TOOTH EXTRACTION         Home Medications    Prior to Admission medications  Medication Sig Start Date End Date Taking? Authorizing Provider  prednisoLONE acetate (PRED FORTE) 1 % ophthalmic suspension 1 drop 4 (four) times daily. 05/20/24  Yes [provider]  aspirin  325 MG EC tablet Take 162.5 mg by mouth daily as needed (when not taking the ibu).    [provider]  ibuprofen  (ADVIL ,MOTRIN ) 200 MG tablet Take 200-800 mg by mouth every 8 (eight)  hours as needed for moderate pain.    [provider]  lisinopril  (ZESTRIL ) 10 MG tablet TAKE 1 TABLET BY MOUTH TWICE A DAY 06/21/24   Kennyth Worth HERO, MD  MELATONIN PO Take 4 tablets by mouth at bedtime.    [provider]  Multiple Vitamin (MULTIVITAMIN) tablet Take 1 tablet by mouth daily.    [provider]  Omega-3 Fatty Acids (FISH OIL) 500 MG CAPS Take 500 mg by mouth daily.    [provider]  oxymetazoline  (AFRIN) 0.05 % nasal spray Place 1 spray into both nostrils at bedtime.    [provider]  pravastatin  (PRAVACHOL ) 20 MG tablet Take 1 tablet (20 mg total) by mouth daily. 07/09/24   Kennyth Worth HERO, MD  Semaglutide , 2 MG/DOSE, 8 MG/3ML SOPN Inject 2 mg as directed once a week. 11/07/23   Kennyth Worth HERO, MD    Family History Family History  Problem Relation Age of Onset   Heart attack Father 17   Heart failure Father        Died @ 77   Heart disease Father    Lupus Mother    Heart attack Brother 77       MI @ 33;.CABG @ 36   Diabetes Brother    Prostate cancer Paternal Grandfather        12   Cancer Paternal Grandfather    Cancer Maternal Uncle        X2, unknown primary   Stroke Maternal Grandmother        > 65   Peripheral vascular disease Brother        S/P leg amputation   Diabetes Son    Colon cancer Neg Hx    Stomach cancer Neg Hx     Social History Social History[1]   Allergies   Patient has no known allergies.   Review of Systems Review of Systems  Constitutional: Negative.   HENT:  Positive for congestion and rhinorrhea.   Respiratory:  Positive for cough.   Cardiovascular: Negative.   Gastrointestinal: Negative.   Genitourinary: Negative.   Musculoskeletal: Negative.   Psychiatric/Behavioral: Negative.       Physical Exam Triage Vital Signs ED Triage Vitals  Encounter Vitals Group     BP 10/07/24 1037 124/81     Girls Systolic BP Percentile --      Girls Diastolic BP Percentile --      Boys  Systolic BP Percentile --      Boys Diastolic BP Percentile --      Pulse Rate 10/07/24 1037 89     Resp 10/07/24 1037 20     Temp 10/07/24 1037 97.8 F (36.6 C)     Temp Source 10/07/24 1037 Oral  SpO2 10/07/24 1037 95 %     Weight 10/07/24 1035 245 lb 9.5 oz (111.4 kg)     Height 10/07/24 1035 6' (1.829 m)     Head Circumference --      Peak Flow --      Pain Score 10/07/24 1035 0     Pain Loc --      Pain Education --      Exclude from Growth Chart --    No data found.  Updated Vital Signs BP 124/81 (BP Location: Left Arm)   Pulse 89   Temp 97.8 F (36.6 C) (Oral)   Resp 20   Ht 6' (1.829 m)   Wt 111.4 kg   SpO2 95%   BMI 33.31 kg/m   Visual Acuity Right Eye Distance:   Left Eye Distance:   Bilateral Distance:    Right Eye Near:   Left Eye Near:    Bilateral Near:     Physical Exam Constitutional:      General: He is not in acute distress.    Appearance: Normal appearance. He is normal weight. He is not ill-appearing or toxic-appearing.  HENT:     Nose: Nose normal.     Mouth/Throat:     Mouth: Mucous membranes are moist.  Cardiovascular:     Rate and Rhythm: Normal rate and regular rhythm.  Pulmonary:     Effort: Pulmonary effort is normal.     Breath sounds: Normal breath sounds.  Musculoskeletal:     Cervical back: Normal range of motion and neck supple.  Skin:    General: Skin is warm.  Neurological:     General: No focal deficit present.     Mental Status: He is alert.  Psychiatric:        Mood and Affect: Mood normal.      UC Treatments / Results  Labs (all labs ordered are listed, but only abnormal results are displayed) Labs Reviewed - No data to display  EKG   Radiology DG Chest 2 View Result Date: 10/07/2024 CLINICAL DATA:  Cough. EXAM: CHEST - 2 VIEW COMPARISON:  Chest radiograph dated 09/12/2011. FINDINGS: No focal consolidation, pleural effusion or pneumothorax. The cardiac silhouette is within limits. Atherosclerotic  calcification of the aorta. Bilateral shoulder arthroplasty. No acute osseous pathology. IMPRESSION: No active cardiopulmonary disease. Electronically Signed   By: Vanetta Chou M.D.   On: 10/07/2024 11:25    Procedures Procedures (including critical care time)  Medications Ordered in UC Medications - No data to display  Initial Impression / Assessment and Plan / UC Course  I have reviewed the triage vital signs and the nursing notes.  Pertinent labs & imaging results that were available during my care of the patient were reviewed by me and considered in my medical decision making (see chart for details).   I called patient in regards to chest xray.  Aware of negative results over the phone.    Final Clinical Impressions(s) / UC Diagnoses   Final diagnoses:  Acute cough  Chronic maxillary sinusitis     Discharge Instructions      You were seen today for continued upper respiratory symptoms.  Your chest xray looks okay, but if the radiologist reads this differently we will call to notify you.  Given your continued symptoms I have sent out another antibiotic, along with cough medication.  If you are not improving or continue with symptoms then please return for re-evaluation.     ED Prescriptions  Medication Sig Dispense Auth. Provider   benzonatate  (TESSALON ) 200 MG capsule Take 1 capsule (200 mg total) by mouth 3 (three) times daily as needed for cough. 21 capsule Louann Hopson, MD   doxycycline  (VIBRAMYCIN ) 100 MG capsule Take 1 capsule (100 mg total) by mouth 2 (two) times daily. 20 capsule Darral Longs, MD      PDMP not reviewed this encounter.     [1]  Social History Tobacco Use   Smoking status: Former    Current packs/day: 0.00    Types: Cigarettes    Quit date: 10/24/1992    Years since quitting: 31.9   Smokeless tobacco: Never   Tobacco comments:    smoked 1960-1994, up to < 1 ppd  Vaping Use   Vaping status: Never Used  Substance Use Topics    Alcohol use: Yes    Comment: wine. occasionally   Drug use: No     Darral Longs, MD 10/07/24 1131

## 2024-10-26 ENCOUNTER — Other Ambulatory Visit: Payer: Self-pay | Admitting: Family Medicine

## 2025-01-06 ENCOUNTER — Ambulatory Visit

## 2025-01-07 ENCOUNTER — Encounter: Admitting: Family Medicine
